# Patient Record
Sex: Female | Born: 1980 | Race: White | Hispanic: No | Marital: Married | Smoking: Never smoker
Health system: Southern US, Community
[De-identification: ages and names within clinical notes are randomized; demographics above are authoritative.]

## PROBLEM LIST (undated history)

## (undated) DIAGNOSIS — E079 Disorder of thyroid, unspecified: Secondary | ICD-10-CM

## (undated) DIAGNOSIS — C50919 Malignant neoplasm of unspecified site of unspecified female breast: Secondary | ICD-10-CM

## (undated) DIAGNOSIS — Z8042 Family history of malignant neoplasm of prostate: Secondary | ICD-10-CM

## (undated) DIAGNOSIS — Z800 Family history of malignant neoplasm of digestive organs: Secondary | ICD-10-CM

## (undated) DIAGNOSIS — E039 Hypothyroidism, unspecified: Secondary | ICD-10-CM

## (undated) HISTORY — DX: Family history of malignant neoplasm of prostate: Z80.42

## (undated) HISTORY — PX: FACIAL FRACTURE SURGERY: SHX1570

## (undated) HISTORY — PX: WISDOM TOOTH EXTRACTION: SHX21

## (undated) HISTORY — PX: NO PAST SURGERIES: SHX2092

## (undated) HISTORY — PX: OTHER SURGICAL HISTORY: SHX169

## (undated) HISTORY — DX: Family history of malignant neoplasm of digestive organs: Z80.0

---

## 2003-06-27 ENCOUNTER — Other Ambulatory Visit: Admission: RE | Admit: 2003-06-27 | Discharge: 2003-06-27 | Payer: Self-pay | Admitting: Obstetrics and Gynecology

## 2004-09-18 ENCOUNTER — Other Ambulatory Visit: Admission: RE | Admit: 2004-09-18 | Discharge: 2004-09-18 | Payer: Self-pay | Admitting: Obstetrics and Gynecology

## 2005-10-23 ENCOUNTER — Other Ambulatory Visit: Admission: RE | Admit: 2005-10-23 | Discharge: 2005-10-23 | Payer: Self-pay | Admitting: Obstetrics and Gynecology

## 2010-04-14 NOTE — L&D Delivery Note (Signed)
Delivery Note At 8:00 AM a viable female, "Heather Mckenzie", was delivered via Vaginal, Spontaneous Delivery (Presentation: Left Occiput Anterior).  Patient in lithotomy/semi-Fowler's for delivery.  Left nuchal arm noted at delivery.  APGAR: 9, 9; weight 7 lb 8.3 oz (3410 g).   Placenta status: Intact, Spontaneous.  Cord: 3 vessels with the following complications: .  Cord pH: NA.   Dr. Normand Sloop called in due to extent of lacerations noted.  Patient given Nubain prior to initiation of repair.  No significant bleeding from lacerations noted while awaiting Dr. Redmond Baseman arrival.  Anesthesia: None  Episiotomy: None Lacerations: 2nd degree;Bilateral Sulcus Suture Repair: See Dr. Redmond Baseman note for further details of repair. Est. Blood Loss (mL): 200  Mom to postpartum.  Baby to skin to skin with mom, in good condition.  Transported to [facility] with mom.Marland Kitchen  Heather Mckenzie 02/24/2011, 11:20 AM

## 2010-08-08 LAB — GC/CHLAMYDIA PROBE AMP, GENITAL
Chlamydia: NEGATIVE
Gonorrhea: NEGATIVE

## 2010-08-08 LAB — ABO/RH: "RH Type ": NEGATIVE

## 2010-08-08 LAB — HIV ANTIBODY (ROUTINE TESTING W REFLEX): HIV: NONREACTIVE

## 2010-08-08 LAB — RUBELLA ANTIBODY, IGM: Rubella: UNDETERMINED

## 2010-08-08 LAB — HEPATITIS B SURFACE ANTIGEN: Hepatitis B Surface Ag: NEGATIVE

## 2010-08-08 LAB — RPR: RPR: NONREACTIVE

## 2010-08-08 LAB — ANTIBODY SCREEN: Antibody Screen: NEGATIVE

## 2011-02-07 LAB — STREP B DNA PROBE: GBS: NEGATIVE

## 2011-02-24 ENCOUNTER — Inpatient Hospital Stay (HOSPITAL_COMMUNITY)
Admission: AD | Admit: 2011-02-24 | Discharge: 2011-02-26 | DRG: 775 | Disposition: A | Discharge status: Home / Self Care | Payer: Medicaid Other | Source: Ambulatory Visit | Attending: Obstetrics and Gynecology | Admitting: Obstetrics and Gynecology

## 2011-02-24 ENCOUNTER — Encounter (HOSPITAL_COMMUNITY): Payer: Self-pay | Admitting: *Deleted

## 2011-02-24 LAB — COMPREHENSIVE METABOLIC PANEL
ALT: 13 U/L (ref 0–35)
AST: 19 U/L (ref 0–37)
Albumin: 2.3 g/dL — ABNORMAL LOW (ref 3.5–5.2)
Alkaline Phosphatase: 262 U/L — ABNORMAL HIGH (ref 39–117)
BUN: 9 mg/dL (ref 6–23)
CO2: 19 mEq/L (ref 19–32)
Calcium: 9.3 mg/dL (ref 8.4–10.5)
Chloride: 100 mEq/L (ref 96–112)
Creatinine, Ser: 0.57 mg/dL (ref 0.50–1.10)
GFR calc Af Amer: >90 mL/min (ref >90)
GFR calc non Af Amer: >90 mL/min (ref >90)
Glucose, Bld: 104 mg/dL — ABNORMAL HIGH (ref 70–99)
Potassium: 4 mEq/L (ref 3.5–5.1)
Sodium: 133 mEq/L — ABNORMAL LOW (ref 135–145)
Total Bilirubin: 0.1 mg/dL — ABNORMAL LOW (ref 0.3–1.2)
Total Protein: 6 g/dL (ref 6.0–8.3)

## 2011-02-24 LAB — CBC
HCT: 38.5 % (ref 36.0–46.0)
Hemoglobin: 12.5 g/dL (ref 12.0–15.0)
MCH: 28 pg (ref 26.0–34.0)
MCHC: 32.5 g/dL (ref 30.0–36.0)
MCV: 86.3 fL (ref 78.0–100.0)
Platelets: 317 10*3/uL (ref 150–400)
RBC: 4.46 MIL/uL (ref 3.87–5.11)
RDW: 14.5 % (ref 11.5–15.5)
WBC: 19.3 10*3/uL — ABNORMAL HIGH (ref 4.0–10.5)

## 2011-02-24 LAB — URIC ACID: Uric Acid, Serum: 6.1 mg/dL (ref 2.4–7.0)

## 2011-02-24 LAB — RPR: RPR Ser Ql: NONREACTIVE

## 2011-02-24 MED ORDER — LACTATED RINGERS IV SOLN
INTRAVENOUS | Status: DC
  Administered 2011-02-24: 07:00:00 via INTRAVENOUS

## 2011-02-24 MED ORDER — ZOLPIDEM TARTRATE 5 MG PO TABS: 5.0000 mg | ORAL_TABLET | Freq: Every evening | ORAL | Status: DC | PRN

## 2011-02-24 MED ORDER — ONDANSETRON HCL 4 MG PO TABS: 4.0000 mg | ORAL_TABLET | ORAL | Status: DC | PRN

## 2011-02-24 MED ORDER — PRENATAL PLUS 27-1 MG PO TABS
1.0000 | ORAL_TABLET | Freq: Every day | ORAL | Status: DC
  Administered 2011-02-24 – 2011-02-25 (×2): 1 via ORAL
  Filled 2011-02-24: qty 1

## 2011-02-24 MED ORDER — SIMETHICONE 80 MG PO CHEW: 80.0000 mg | CHEWABLE_TABLET | ORAL | Status: DC | PRN

## 2011-02-24 MED ORDER — DIPHENHYDRAMINE HCL 25 MG PO CAPS: 25.0000 mg | ORAL_CAPSULE | Freq: Four times a day (QID) | ORAL | Status: DC | PRN

## 2011-02-24 MED ORDER — OXYTOCIN 20 UNITS IN LACTATED RINGERS INFUSION - SIMPLE
INTRAVENOUS | Status: AC
  Filled 2011-02-24: qty 1000

## 2011-02-24 MED ORDER — OXYTOCIN 20 UNITS IN LACTATED RINGERS INFUSION - SIMPLE
125.0000 mL/h | Freq: Once | INTRAVENOUS | Status: AC
  Administered 2011-02-24: 999 mL/h via INTRAVENOUS

## 2011-02-24 MED ORDER — CITRIC ACID-SODIUM CITRATE 334-500 MG/5ML PO SOLN: 30.0000 mL | ORAL | Status: DC | PRN

## 2011-02-24 MED ORDER — TETANUS-DIPHTH-ACELL PERTUSSIS 5-2.5-18.5 LF-MCG/0.5 IM SUSP: 0.5000 mL | Freq: Once | INTRAMUSCULAR | Status: DC

## 2011-02-24 MED ORDER — LANOLIN HYDROUS EX OINT: TOPICAL_OINTMENT | CUTANEOUS | Status: DC | PRN

## 2011-02-24 MED ORDER — LIDOCAINE HCL (PF) 1 % IJ SOLN
30.0000 mL | INTRAMUSCULAR | Status: AC | PRN
  Administered 2011-02-24 (×2): 30 mL via SUBCUTANEOUS

## 2011-02-24 MED ORDER — LIDOCAINE HCL (PF) 1 % IJ SOLN
INTRAMUSCULAR | Status: AC
  Filled 2011-02-24: qty 30

## 2011-02-24 MED ORDER — ACETAMINOPHEN 325 MG PO TABS: 650.0000 mg | ORAL_TABLET | ORAL | Status: DC | PRN

## 2011-02-24 MED ORDER — BUPIVACAINE-EPINEPHRINE 0.25% -1:200000 IJ SOLN
50.0000 mL | Freq: Once | INTRAMUSCULAR | Status: DC
Start: 2011-02-24 — End: 2011-02-24
  Filled 2011-02-24: qty 50

## 2011-02-24 MED ORDER — ONDANSETRON HCL 4 MG/2ML IJ SOLN: 4.0000 mg | INTRAMUSCULAR | Status: DC | PRN

## 2011-02-24 MED ORDER — NALBUPHINE SYRINGE 5 MG/0.5 ML: 5.0000 mg | INJECTION | Freq: Once | INTRAMUSCULAR | Status: DC

## 2011-02-24 MED ORDER — OXYTOCIN 10 UNIT/ML IJ SOLN: 10.0000 [IU] | Freq: Once | INTRAMUSCULAR | Status: DC

## 2011-02-24 MED ORDER — MAGNESIUM HYDROXIDE 400 MG/5ML PO SUSP: 30.0000 mL | ORAL | Status: DC | PRN

## 2011-02-24 MED ORDER — ONDANSETRON HCL 4 MG/2ML IJ SOLN: 4.0000 mg | Freq: Four times a day (QID) | INTRAMUSCULAR | Status: DC | PRN

## 2011-02-24 MED ORDER — DIBUCAINE 1 % RE OINT: 1.0000 "application " | TOPICAL_OINTMENT | RECTAL | Status: DC | PRN

## 2011-02-24 MED ORDER — WITCH HAZEL-GLYCERIN EX PADS: 1.0000 "application " | MEDICATED_PAD | CUTANEOUS | Status: DC | PRN

## 2011-02-24 MED ORDER — OXYTOCIN BOLUS FROM INFUSION
500.0000 mL | Freq: Once | INTRAVENOUS | Status: DC
  Filled 2011-02-24: qty 500
  Filled 2011-02-24: qty 1000

## 2011-02-24 MED ORDER — OXYCODONE-ACETAMINOPHEN 5-325 MG PO TABS
1.0000 | ORAL_TABLET | ORAL | Status: DC | PRN
  Administered 2011-02-24: 1 via ORAL
  Filled 2011-02-24: qty 1

## 2011-02-24 MED ORDER — LACTATED RINGERS IV SOLN: 500.0000 mL | INTRAVENOUS | Status: DC | PRN

## 2011-02-24 MED ORDER — IBUPROFEN 600 MG PO TABS: 600.0000 mg | ORAL_TABLET | Freq: Four times a day (QID) | ORAL | Status: DC | PRN

## 2011-02-24 MED ORDER — OXYCODONE-ACETAMINOPHEN 5-325 MG PO TABS: 2.0000 | ORAL_TABLET | ORAL | Status: DC | PRN

## 2011-02-24 MED ORDER — OXYTOCIN 10 UNIT/ML IJ SOLN
INTRAMUSCULAR | Status: AC
  Filled 2011-02-24: qty 2

## 2011-02-24 MED ORDER — MEASLES, MUMPS & RUBELLA VAC ~~LOC~~ INJ
0.5000 mL | INJECTION | Freq: Once | SUBCUTANEOUS | Status: AC
  Administered 2011-02-26: 0.5 mL via SUBCUTANEOUS
  Filled 2011-02-24 (×2): qty 0.5

## 2011-02-24 MED ORDER — BENZOCAINE-MENTHOL 20-0.5 % EX AERO
1.0000 "application " | INHALATION_SPRAY | CUTANEOUS | Status: DC | PRN
  Administered 2011-02-25: 1 via TOPICAL
  Administered 2011-02-26: 10:00:00 via TOPICAL

## 2011-02-24 MED ORDER — IBUPROFEN 600 MG PO TABS
600.0000 mg | ORAL_TABLET | Freq: Four times a day (QID) | ORAL | Status: DC
  Administered 2011-02-24 – 2011-02-26 (×7): 600 mg via ORAL
  Filled 2011-02-24 (×7): qty 1

## 2011-02-24 MED ORDER — NALBUPHINE SYRINGE 5 MG/0.5 ML
INJECTION | INTRAMUSCULAR | Status: AC
Start: 2011-02-24 — End: 2011-02-24
  Administered 2011-02-24: 5 mg via INTRAVENOUS
  Filled 2011-02-24: qty 0.5

## 2011-02-24 MED ORDER — SENNOSIDES-DOCUSATE SODIUM 8.6-50 MG PO TABS
2.0000 | ORAL_TABLET | Freq: Every day | ORAL | Status: DC
  Administered 2011-02-25: 2 via ORAL
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
## 2011-02-24 NOTE — Progress Notes (Signed)
UR chart review completed.  

## 2011-02-24 NOTE — Progress Notes (Signed)
.  Subjective: Coping well, c/o suprapubic pain, pushing well   Objective: BP 146/68  Pulse 90  Temp(Src) 99 F (37.2 C) (Oral)  Resp 20  Ht 5\' 2"  (1.575 m)  Wt 92.08 kg (203 lb)  BMI 37.13 kg/m2   FHT:  FHR: 130 bpm, variability: moderate,  accelerations:  Present,  decelerations:  Present early decels UC:   regular, every 2-3 minutes SVE:   Dilation: 10 Effacement (%): 100 Station: +1 Exam by:: S. Tula Schryver, CNM    Assessment / Plan: Spontaneous labor, progressing normally GBS    Fetal Wellbeing:  Category I Pain Control:  Labor support without medications  Pushing well, progressing, fhr with early decels, overall reassuring IVF"s started and routine labs including PIH labs drawn   Update physician PRN  Ahlayah Tarkowski M 02/24/2011, 7:05 AM

## 2011-02-24 NOTE — Progress Notes (Signed)
I was called to see the patient to help repair the vaginal lacerations after delivery.  The patient had a right sulcus laceration. She had a left sulcus laceration as well. A second-degree perineal laceration as well. 30 cc of quarter percent Marcaine and epinephrine and 30 cc of 1% Xylocaine were used for anesthesia. Patient was also given 1 mg of Nubain. A sponge was placed into the vagina. Sims retractor was also used to have visualization. A right sulcus laceration was repaired with 2-0 chromic in a running fashion. Hemostasis was noted. The left sulcus tear was repaired with 2-0 chromic and any continuous fashion.hemo Stasis was noted. The perineal laceration and perineal body was reapproximated using 3-0 Vicryl with interrupted sutures manger of the perineal laceration was repaired with 3-0 Vicryl using a running stitch and subcuticular stitches on the skin. The sponge was removed from the vagina as was the retractor. A rectal exam was done the rectum was intact with good tone and no sutures palpated. EBL is about 100 cc.

## 2011-02-24 NOTE — Plan of Care (Signed)
Problem: Consults Goal: Birthing Suites Patient Information Press F2 to bring up selections list Outcome: Completed/Met Date Met:  02/24/11  Pt 37-[redacted] weeks EGA  Comments:  39.3 wks

## 2011-02-24 NOTE — Progress Notes (Signed)
Notified of VE.  Pt may go to room 165.

## 2011-02-24 NOTE — Progress Notes (Signed)
FHT's prior to tansfer.  Pt 9cm and transferred to BS.

## 2011-02-24 NOTE — Progress Notes (Signed)
Notified of VE.  Orders to transfer to [facility].

## 2011-02-25 LAB — CBC
HCT: 32.7 % — ABNORMAL LOW (ref 36.0–46.0)
Hemoglobin: 10.5 g/dL — ABNORMAL LOW (ref 12.0–15.0)
MCH: 28.1 pg (ref 26.0–34.0)
MCHC: 32.1 g/dL (ref 30.0–36.0)
MCV: 87.4 fL (ref 78.0–100.0)
Platelets: 297 10*3/uL (ref 150–400)
RBC: 3.74 MIL/uL — ABNORMAL LOW (ref 3.87–5.11)
RDW: 15 % (ref 11.5–15.5)
WBC: 16.5 10*3/uL — ABNORMAL HIGH (ref 4.0–10.5)

## 2011-02-25 LAB — ABO/RH: RH Type: POSITIVE

## 2011-02-25 MED ORDER — BENZOCAINE-MENTHOL 20-0.5 % EX AERO
INHALATION_SPRAY | CUTANEOUS | Status: AC
  Administered 2011-02-25: 1 via TOPICAL
  Filled 2011-02-25: qty 56

## 2011-02-25 NOTE — Progress Notes (Signed)
Post Partum Day 1 Subjective: no complaints, up ad lib, voiding, tolerating PO and + flatus  Objective: Blood pressure 130/90, pulse 99, temperature 97.5 F (36.4 C), temperature source Oral, resp. rate 18, height 5\' 2"  (1.575 m), weight 92.08 kg (203 lb), SpO2 96.00%, unknown if currently breastfeeding.  Physical Exam:  General: alert Lochia: appropriate Uterine Fundus: firm Incision: NA DVT Evaluation: No evidence of DVT seen on physical exam.   Basename 02/25/11 0603 02/24/11 0645  HGB 10.5* 12.5  HCT 32.7* 38.5    Assessment/Plan: Plan for discharge tomorrow Maybe today. Contraception: Nortrel 777 Bottle Feeding Circ discussed.   LOS: 1 day   Heather Mckenzie V 02/25/2011, 10:52 AM

## 2011-02-25 NOTE — Progress Notes (Signed)
Mom is o+ in prenatals.  Spoke with tameka in the lab.  She stated prenatals show o+, so no rhogam studies ordered Lottie Dawson rn

## 2011-02-26 MED ORDER — BENZOCAINE-MENTHOL 20-0.5 % EX AERO
INHALATION_SPRAY | CUTANEOUS | Status: AC
  Filled 2011-02-26: qty 56

## 2011-02-26 MED ORDER — IBUPROFEN 600 MG PO TABS: 600.0000 mg | ORAL_TABLET | Freq: Four times a day (QID) | ORAL | Status: AC

## 2011-02-26 MED ORDER — NORETHINDRONE-ETH ESTRADIOL 1-35 MG-MCG PO TABS: 1.0000 | ORAL_TABLET | Freq: Every day | ORAL | Status: DC

## 2011-02-26 NOTE — Discharge Summary (Signed)
  Obstetric Discharge Summary Reason for Admission: onset of labor Prenatal Procedures: none Intrapartum Procedures: spontaneous vaginal delivery Postpartum Procedures: none Complications-Operative and Postpartum: 2nd degree perineal laceration, bilateral sulcus tears  Temp:  [97.6 F (36.4 C)-98.2 F (36.8 C)] 97.6 F (36.4 C) (11/14 0500) Pulse Rate:  [79-98] 79  (11/14 0500) Resp:  [18-20] 18  (11/14 0500) BP: (106-114)/(68-76) 106/71 mmHg (11/14 0500) Hemoglobin  Date Value Range Status  02/25/2011 10.5* 12.0-15.0 (g/dL) Final     HCT  Date Value Range Status  02/25/2011 32.7* 36.0-46.0 (%) Final    Hospital Course:  Admitted in labor 02/24/11. Negative GBS. Progressed to fully dilated quickly, without medication, at 5:30 am.  Delivery was performed by Nigel Bridgeman, CNM, at 8am  Patient and baby tolerated the procedure well, although patient did have a deep 2nd degree and bilateral sulcus lacerations noted. In light of the depth of the right sulcus tear, and the patient's unmedicated status, Dr. Normand Sloop was asked to come to complete the repair.  This was done without difficulty, with local anesthesia and Nubain IV.  Infant to FTN. Mother and infant then had an uncomplicated postpartum course, with bottlefeeding going well, and appropriate perineal healing. Mom's physical exam was WNL, and she was discharged home in stable condition. Contraception plan was Nortrell 777.  She received adequate benefit from po pain medications, using only Motrin.    Other issues:  NA      Discharge Diagnoses: Term Pregnancy-delivered  Discharge Information: Date: 02/26/2011 Activity: Per [facility] handout Diet: routine Medications:  Medication List  As of 02/26/2011  6:55 AM   START taking these medications         ibuprofen 600 MG tablet   Commonly known as: ADVIL,MOTRIN   Take 1 tablet (600 mg total) by mouth every 6 (six) hours.         CONTINUE taking these medications        cetirizine 10 MG tablet   Commonly known as: ZYRTEC      IRON PO      prenatal vitamin w/FE, FA 27-1 MG Tabs          Where to get your medications    These are the prescriptions that you need to pick up.   You may get these medications from any pharmacy.         ibuprofen 600 MG tablet           Condition: stable Instructions: refer to practice specific booklet, use sitz bath BID Discharge to: home Follow-up Information    Follow up with [facility] in 6 weeks.         Newborn Data: Live born "[place]" Information for the patient's newborn:  Kirti, Carl [[id number]]  female ; APGAR 9/9 , ; weight 7+8;  Home with mother.  Nigel Bridgeman 02/26/2011, 6:55 AM

## 2011-12-12 ENCOUNTER — Other Ambulatory Visit (INDEPENDENT_AMBULATORY_CARE_PROVIDER_SITE_OTHER): Payer: Self-pay

## 2011-12-12 ENCOUNTER — Telehealth: Payer: Self-pay | Admitting: Obstetrics and Gynecology

## 2011-12-12 DIAGNOSIS — N912 Amenorrhea, unspecified: Secondary | ICD-10-CM

## 2011-12-12 LAB — POCT URINE PREGNANCY: Preg Test, Ur: POSITIVE

## 2011-12-12 NOTE — Progress Notes (Unsigned)
Pt had appt for UPT. Positive.  Gave PNV's and sch for NOB workup.  Pt is 9 weeks and 5 days.  Pt had last baby in 11/12.

## 2011-12-22 ENCOUNTER — Telehealth: Payer: Self-pay | Admitting: Obstetrics and Gynecology
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
## 2011-12-23 ENCOUNTER — Encounter: Payer: Medicaid Other | Admitting: Obstetrics and Gynecology

## 2011-12-23 ENCOUNTER — Encounter: Payer: Self-pay | Admitting: Obstetrics and Gynecology

## 2011-12-25 ENCOUNTER — Encounter: Payer: Self-pay | Admitting: Obstetrics and Gynecology

## 2011-12-30 NOTE — Telephone Encounter (Signed)
DONE

## 2012-01-02 ENCOUNTER — Encounter: Payer: Self-pay | Admitting: Obstetrics and Gynecology

## 2012-01-27 ENCOUNTER — Encounter: Payer: Self-pay | Admitting: Obstetrics and Gynecology

## 2012-01-27 ENCOUNTER — Ambulatory Visit (INDEPENDENT_AMBULATORY_CARE_PROVIDER_SITE_OTHER): Payer: Medicaid Other | Admitting: Obstetrics and Gynecology

## 2012-01-27 VITALS — BP 110/64 | Wt 190.0 lb

## 2012-01-27 DIAGNOSIS — Z3490 Encounter for supervision of normal pregnancy, unspecified, unspecified trimester: Secondary | ICD-10-CM

## 2012-01-27 DIAGNOSIS — O09899 Supervision of other high risk pregnancies, unspecified trimester: Secondary | ICD-10-CM

## 2012-01-27 DIAGNOSIS — Z8619 Personal history of other infectious and parasitic diseases: Secondary | ICD-10-CM

## 2012-01-27 DIAGNOSIS — IMO0001 Reserved for inherently not codable concepts without codable children: Secondary | ICD-10-CM | POA: Insufficient documentation

## 2012-01-27 DIAGNOSIS — Z124 Encounter for screening for malignant neoplasm of cervix: Secondary | ICD-10-CM

## 2012-01-27 DIAGNOSIS — Z331 Pregnant state, incidental: Secondary | ICD-10-CM

## 2012-01-27 DIAGNOSIS — O09299 Supervision of pregnancy with other poor reproductive or obstetric history, unspecified trimester: Secondary | ICD-10-CM

## 2012-01-27 LAB — POCT URINALYSIS DIPSTICK
Bilirubin, UA: NEGATIVE
Blood, UA: NEGATIVE
Glucose, UA: NEGATIVE
Ketones, UA: NEGATIVE
Leukocytes, UA: NEGATIVE
Nitrite, UA: NEGATIVE
Protein, UA: NEGATIVE
Spec Grav, UA: 1.02
Urobilinogen, UA: NEGATIVE
pH, UA: 6

## 2012-01-27 MED ORDER — CONCEPT DHA 53.5-38-1 MG PO CAPS: 1.0000 | ORAL_CAPSULE | Freq: Every day | ORAL | Status: DC

## 2012-01-27 NOTE — Progress Notes (Signed)
Subjective:    Madigan Kreamer is being seen today for her first obstetrical visit at [redacted]w[redacted]d gestation by LMP.  She reports doing well--feels better than last pregnancy.  Her obstetrical history is significant for: Patient Active Problem List  Diagnosis  . Short interval between pregnancies complicating pregnancy, antepartum  . FHX of NTD  . Hx of equivocal  rubella  . Hx of maternal laceration, sulcus tear, currently pregnant  . Rapid first stage of labor    Relationship with FOB:  Married, supportive,  Feeding plan:   Breast  Pregnancy history fully reviewed.  The following portions of the patient's history were reviewed and updated as appropriate: allergies, current medications, past family history, past medical history, past social history, past surgical history and problem list.  Review of Systems Pertinent ROS is described in HPI   Objective:   BP 110/64  Wt 190 lb (86.183 kg)  LMP 10/05/2011  Breastfeeding? Unknown Wt Readings from Last 1 Encounters:  01/27/12 190 lb (86.183 kg)   BMI: There is no height on file to calculate BMI.  General: alert, cooperative and no distress Respiratory: clear to auscultation bilaterally Cardiovascular: regular rate and rhythm, S1, S2 normal, no murmur Breasts:  No dominant masses, nipples erect Gastrointestinal: soft, non-tender; no masses,  no organomegaly Extremities: extremities normal, no pain or edema Vaginal Bleeding: None  EXTERNAL GENITALIA: normal appearing vulva with no masses, tenderness or lesions VAGINA: no abnormal discharge or lesions CERVIX: no lesions or cervical motion tenderness; cervix closed, long, firm UTERUS: gravid and consistent with 16 weeks ADNEXA: no masses palpable and nontender OB EXAM PELVIMETRY: appears adequate   FHR:  160  bpm  Assessment:    Pregnancy at  16 2/7 weeks Late to care Hx rapid labor Hx sulcus tear(s) with delivery Plan:     Prenatal panel reviewed and discussed with  the patient:  Labs drawn today Pap smear collected: Done today GC/Chlamydia collected: Done today Wet prep:  Negative Discussion of Genetic testing options: Declines genetic testing Prenatal vitamins recommended Problem list reviewed and updated.  Plan of care: Follow up in 4 weeks for ROB and anatomy US. Rx [facility] DHA per patient request   Nigel Bridgeman, CNM, [place]

## 2012-01-27 NOTE — Progress Notes (Signed)
LMP 10/05/2011 Last Pap smear 2012 WNL Pt states that she saw blood in toilet yesterday after she voided. Not sure if it was from urine or vagina.  Pt declines Flu Vaccine

## 2012-01-28 ENCOUNTER — Encounter: Payer: Self-pay | Admitting: Obstetrics and Gynecology

## 2012-01-28 LAB — PRENATAL PANEL VII
Antibody Screen: NEGATIVE
Basophils Absolute: 0 10*3/uL (ref 0.0–0.1)
Basophils Relative: 0 % (ref 0–1)
Eosinophils Absolute: 0.1 10*3/uL (ref 0.0–0.7)
Eosinophils Relative: 1 % (ref 0–5)
HCT: 36.3 % (ref 36.0–46.0)
HIV: NONREACTIVE
Hemoglobin: 11.8 g/dL — ABNORMAL LOW (ref 12.0–15.0)
Hepatitis B Surface Ag: NEGATIVE
Lymphocytes Relative: 22 % (ref 12–46)
Lymphs Abs: 2.5 10*3/uL (ref 0.7–4.0)
MCH: 28.2 pg (ref 26.0–34.0)
MCHC: 32.5 g/dL (ref 30.0–36.0)
MCV: 86.6 fL (ref 78.0–100.0)
Monocytes Absolute: 1.2 10*3/uL — ABNORMAL HIGH (ref 0.1–1.0)
Monocytes Relative: 10 % (ref 3–12)
Neutro Abs: 7.6 10*3/uL (ref 1.7–7.7)
Neutrophils Relative %: 67 % (ref 43–77)
Platelets: 344 10*3/uL (ref 150–400)
RBC: 4.19 MIL/uL (ref 3.87–5.11)
RDW: 13.6 % (ref 11.5–15.5)
Rh Type: POSITIVE
Rubella: 29.6 IU/mL — ABNORMAL HIGH
WBC: 11.5 10*3/uL — ABNORMAL HIGH (ref 4.0–10.5)

## 2012-01-28 LAB — GC/CHLAMYDIA PROBE AMP, GENITAL
Chlamydia, DNA Probe: NEGATIVE
GC Probe Amp, Genital: NEGATIVE

## 2012-02-11 ENCOUNTER — Ambulatory Visit: Payer: Self-pay | Admitting: Obstetrics and Gynecology

## 2012-02-18 ENCOUNTER — Ambulatory Visit (INDEPENDENT_AMBULATORY_CARE_PROVIDER_SITE_OTHER): Payer: Medicaid Other | Admitting: Obstetrics and Gynecology

## 2012-02-18 ENCOUNTER — Ambulatory Visit (INDEPENDENT_AMBULATORY_CARE_PROVIDER_SITE_OTHER): Payer: Medicaid Other

## 2012-02-18 VITALS — BP 110/72 | Wt 196.0 lb

## 2012-02-18 DIAGNOSIS — Z3689 Encounter for other specified antenatal screening: Secondary | ICD-10-CM

## 2012-02-18 DIAGNOSIS — Z3490 Encounter for supervision of normal pregnancy, unspecified, unspecified trimester: Secondary | ICD-10-CM

## 2012-02-18 DIAGNOSIS — O358XX0 Maternal care for other (suspected) fetal abnormality and damage, not applicable or unspecified: Secondary | ICD-10-CM

## 2012-02-18 DIAGNOSIS — IMO0002 Reserved for concepts with insufficient information to code with codable children: Secondary | ICD-10-CM

## 2012-02-18 LAB — US OB COMP + 14 WK

## 2012-02-18 NOTE — Progress Notes (Signed)
Pt stated no issues today.  

## 2012-02-18 NOTE — Progress Notes (Signed)
[place] today:  19 5/7 weeks, EDC 07/09/12, c/w dating. EFW 55%ile, cervix 3.39, normal anatomy, but spine views limited. Pylectasis noted bilaterally--RK 0.30, LK 0.35. Plan f/u at [facility] on anatomy limitations and FRP. Declined genetic testing.

## 2012-03-16 ENCOUNTER — Other Ambulatory Visit: Payer: Self-pay

## 2012-03-16 DIAGNOSIS — Z3689 Encounter for other specified antenatal screening: Secondary | ICD-10-CM

## 2012-03-17 ENCOUNTER — Ambulatory Visit (INDEPENDENT_AMBULATORY_CARE_PROVIDER_SITE_OTHER): Payer: Medicaid Other | Admitting: Obstetrics and Gynecology

## 2012-03-17 ENCOUNTER — Ambulatory Visit (INDEPENDENT_AMBULATORY_CARE_PROVIDER_SITE_OTHER): Payer: Medicaid Other

## 2012-03-17 ENCOUNTER — Encounter: Payer: Self-pay | Admitting: Obstetrics and Gynecology

## 2012-03-17 VITALS — BP 110/60 | Wt 204.0 lb

## 2012-03-17 DIAGNOSIS — Z331 Pregnant state, incidental: Secondary | ICD-10-CM

## 2012-03-17 DIAGNOSIS — Z3689 Encounter for other specified antenatal screening: Secondary | ICD-10-CM

## 2012-03-17 DIAGNOSIS — Z1389 Encounter for screening for other disorder: Secondary | ICD-10-CM

## 2012-03-17 DIAGNOSIS — O358XX0 Maternal care for other (suspected) fetal abnormality and damage, not applicable or unspecified: Secondary | ICD-10-CM

## 2012-03-17 LAB — US OB FOLLOW UP

## 2012-03-17 NOTE — Progress Notes (Signed)
[redacted]w[redacted]d  [place] f/u fetal spine still not viewed Bilateral fetal pyelectasis persists rv'd PTL sx's RTO 4 wks Repeat US  1hr gtt

## 2012-03-17 NOTE — Patient Instructions (Signed)
Preterm Labor Preterm labor is when labor starts at less than 37 weeks of pregnancy. The normal length of a pregnancy is 39 to 41 weeks. CAUSES Often, there is no identifiable underlying cause as to why a woman goes into preterm labor. However, one of the most common known causes of preterm labor is infection. Infections of the uterus, cervix, vagina, amniotic sac, bladder, kidney, or even the lungs (pneumonia) can cause labor to start. Other causes of preterm labor include:  Urogenital infections, such as yeast infections and bacterial vaginosis.  Uterine abnormalities (uterine shape, uterine septum, fibroids, bleeding from the placenta).  A cervix that has been operated on and opens prematurely.  Malformations in the baby.  Multiple gestations (twins, triplets, and so on).  Breakage of the amniotic sac. Additional risk factors for preterm labor include:  Previous history of preterm labor.  Premature rupture of membranes (PROM).  A placenta that covers the opening of the cervix (placenta previa).  A placenta that separates from the uterus (placenta abruption).  A cervix that is too weak to hold the baby in the uterus (incompetence cervix).  Having too much fluid in the amniotic sac (polyhydramnios).  Taking illegal drugs or smoking while pregnant.  Not gaining enough weight while pregnant.  Women younger than 36 and older than 31 years old.  Low socioeconomic status.  African-American ethnicity. SYMPTOMS Signs and symptoms of preterm labor include:  Menstrual-like cramps.  Contractions that are 30 to 70 seconds apart, become very regular, closer together, and are more intense and painful.  Contractions that start on the top of the uterus and spread down to the lower abdomen and back.  A sense of increased pelvic pressure or back pain.  A watery or bloody discharge that comes from the vagina. DIAGNOSIS  A diagnosis can be confirmed by:  A vaginal exam.  An  ultrasound of the cervix.  Sampling (swabbing) cervico-vaginal secretions. These samples can be tested for the presence of fetal fibronectin. This is a protein found in cervical discharge which is associated with preterm labor.  Fetal monitoring. TREATMENT  Depending on the length of the pregnancy and other circumstances, a caregiver may suggest bed rest. If necessary, there are medicines that can be given to stop contractions and to quicken fetal lung maturity. If labor happens before 34 weeks of pregnancy, a prolonged hospital stay may be recommended. Treatment depends on the condition of both the mother and baby. PREVENTION There are some things a mother can do to lower the risk of preterm labor in future pregnancies. A woman can:   Stop smoking.  Maintain healthy weight gain and avoid chemicals and drugs that are not necessary.  Be watchful for any type of infection.  Inform her caregiver if she has a known history of preterm labor. Document Released: 06/21/2003 Document Revised: 06/23/2011 Document Reviewed: 07/26/2010 [facility] Patient Information 2013 [place], [place].  Fetal Movement Counts  Kick counts is highly recommended in high risk pregnancies, but it is a good idea for every pregnant woman to do. Start counting fetal movements at 28 weeks of the pregnancy.  Fetal movements increase after eating a full meal or eating or drinking something sweet (the blood sugar is higher). It is also important to drink plenty of fluids (well hydrated) before doing the count. (At least 64oz of water/day) Lie on your left side because it helps with the circulation or you can sit in a comfortable chair with your arms over your belly (abdomen) with no distractions  around you. DOING THE COUNT  Try to do the count the same time of day each time you do it.   Mark the day and time, then see how long it takes for you to feel 10 movements (kicks, flutters, swishes, rolls). You should have at least 10  movements within 2 hours. You will most likely feel 10 movements in much less than 2 hours. If you do not, wait an hour and count again. After a couple of days you will see a pattern.   What you are looking for is a change in the pattern or not enough counts in 2 hours. Is it taking longer in time to reach 10 movements?   SEEK MEDICAL CARE IF: - Please call the office - even after hours.   You feel less than 10 counts in 2 hours. Tried twice.   No movement in one hour.   The pattern is changing or taking longer each day to reach 10 counts in 2 hours.   You feel the baby is not moving as it usually does.

## 2012-03-17 NOTE — Progress Notes (Signed)
[redacted]w[redacted]d Ultrasound shows:  SIUP  S=D     [place] EDD: 07/11/12           AFI: normal (AP Pocket 5.6 cm)           EFW 57%tile           Cervical length: 3.17 cm           Placenta localization: anterior           Fetal presentation: breech                    Anatomy survey is spine only viewed in supine position again today           Mild Fetal pyelectasis RK = 3.6 mm LK =4.0 mm / Previous scan RK = 3.0 LK 3.5 mm

## 2012-04-13 ENCOUNTER — Ambulatory Visit (INDEPENDENT_AMBULATORY_CARE_PROVIDER_SITE_OTHER): Payer: Medicaid Other

## 2012-04-13 ENCOUNTER — Other Ambulatory Visit: Payer: Medicaid Other

## 2012-04-13 ENCOUNTER — Ambulatory Visit (INDEPENDENT_AMBULATORY_CARE_PROVIDER_SITE_OTHER): Payer: Medicaid Other | Admitting: Obstetrics and Gynecology

## 2012-04-13 VITALS — BP 126/68 | Wt 205.0 lb

## 2012-04-13 DIAGNOSIS — IMO0002 Reserved for concepts with insufficient information to code with codable children: Secondary | ICD-10-CM

## 2012-04-13 DIAGNOSIS — O358XX0 Maternal care for other (suspected) fetal abnormality and damage, not applicable or unspecified: Secondary | ICD-10-CM

## 2012-04-13 DIAGNOSIS — Z3689 Encounter for other specified antenatal screening: Secondary | ICD-10-CM

## 2012-04-13 DIAGNOSIS — O09899 Supervision of other high risk pregnancies, unspecified trimester: Secondary | ICD-10-CM

## 2012-04-13 DIAGNOSIS — Z331 Pregnant state, incidental: Secondary | ICD-10-CM

## 2012-04-13 LAB — HEMOGLOBIN: Hemoglobin: 11.1 g/dL — ABNORMAL LOW (ref 12.0–15.0)

## 2012-04-13 NOTE — Progress Notes (Signed)
[redacted]w[redacted]d Pt voices no complaints. Pt denies ha's, dizziness, or visual changes.  Ultrasound shows:  SIUP  S=D     [place] EDD: 07/11/2012            AFI: 14.4cm=50th%tile           Cervical length: 3.49 cm           Placenta localization: anterior           Fetal presentation: cephalic           Normal growth           Anatomy survey is normal           Gender : female  Fetal spine is visualized today and appears normal. This completes anatomy survey. Fetal kidneys are normal today. No pyelectasis seen.  Declines flu shot

## 2012-04-14 LAB — RPR

## 2012-04-14 LAB — GLUCOSE TOLERANCE, 1 HOUR (50G) W/O FASTING: Glucose, 1 Hour GTT: 117 mg/dL (ref 70–140)

## 2012-04-14 NOTE — L&D Delivery Note (Signed)
Delivery Note At 12:01 PM a viable female, "Heather Mckenzie", was delivered via Vaginal, Spontaneous Delivery (Presentation: OA), while patient was on the toilet, but was attended by myself and the RN.Marland Kitchen  APGAR: 8, 9; weight .   Placenta status: Intact, Spontaneous.  Cord: 3 vessels with the following complications: None.  Cord pH: NA  GBS done today resulted = Negative.  Patient assisted back to bed after delivery, before placental expulsion.  Anesthesia: Lidocaine for repair, Stadol for patient comfort. Episiotomy: None Lacerations: 2nd degree laceration, with shearing effect at introitus--tissues reapproximated, ice pack applied after delivery. Suture Repair: 3.0 vicryl and 3.0 monocryl. Est. Blood Loss (mL):  300 cc  Mom to postpartum.  Baby to skin to skin.Marland Kitchen  Nigel Bridgeman 06/14/2012, 1:29 PM

## 2012-04-16 LAB — US OB FOLLOW UP

## 2012-04-28 ENCOUNTER — Ambulatory Visit: Payer: Medicaid Other | Admitting: Obstetrics and Gynecology

## 2012-04-28 VITALS — BP 100/62 | Wt 208.0 lb

## 2012-04-28 DIAGNOSIS — O09899 Supervision of other high risk pregnancies, unspecified trimester: Secondary | ICD-10-CM

## 2012-04-28 NOTE — Progress Notes (Signed)
[redacted]w[redacted]d Doing well No complaints Reviewed labs from last visit incl nl GCT FKCs RTO 2wks If s>d persists rec f/u u/s to check EFW

## 2012-05-14 ENCOUNTER — Ambulatory Visit: Payer: Medicaid Other | Admitting: Obstetrics and Gynecology

## 2012-05-14 VITALS — BP 122/62 | Wt 211.0 lb

## 2012-05-14 DIAGNOSIS — O09899 Supervision of other high risk pregnancies, unspecified trimester: Secondary | ICD-10-CM

## 2012-05-14 NOTE — Progress Notes (Signed)
[redacted]w[redacted]d No complaints FKCs  RTO 2wks

## 2012-05-27 ENCOUNTER — Encounter: Payer: Medicaid Other | Admitting: Obstetrics and Gynecology

## 2012-06-01 ENCOUNTER — Encounter: Payer: Medicaid Other | Admitting: Obstetrics and Gynecology

## 2012-06-10 ENCOUNTER — Ambulatory Visit: Payer: Medicaid Other | Admitting: Certified Nurse Midwife

## 2012-06-10 VITALS — BP 110/60 | Wt 216.0 lb

## 2012-06-10 DIAGNOSIS — Z331 Pregnant state, incidental: Secondary | ICD-10-CM

## 2012-06-10 NOTE — Progress Notes (Signed)
Pt stated no issues today. Pt was wondering if she can do her beta strep next visit and that is on 06/15/2012.

## 2012-06-10 NOTE — Progress Notes (Signed)
[redacted]w[redacted]d Pt feeling well.  GFM Discussed GBS testing due today -- pt has an appt Tues and would prefer to do it then Discussed contraceptive options available for after the pregnancy.  Was taking OCPs when she got pregnant so is considering IUD. Vtx by [employer]

## 2012-06-14 ENCOUNTER — Inpatient Hospital Stay (HOSPITAL_COMMUNITY)
Admission: AD | Admit: 2012-06-14 | Discharge: 2012-06-16 | DRG: 775 | Disposition: A | Discharge status: Home / Self Care | Payer: Medicaid Other | Source: Ambulatory Visit | Attending: Obstetrics and Gynecology | Admitting: Obstetrics and Gynecology

## 2012-06-14 ENCOUNTER — Encounter (HOSPITAL_COMMUNITY): Payer: Self-pay | Admitting: *Deleted

## 2012-06-14 ENCOUNTER — Telehealth: Payer: Self-pay | Admitting: Obstetrics and Gynecology

## 2012-06-14 ENCOUNTER — Encounter: Payer: Medicaid Other | Admitting: Certified Nurse Midwife

## 2012-06-14 DIAGNOSIS — Z8619 Personal history of other infectious and parasitic diseases: Secondary | ICD-10-CM

## 2012-06-14 DIAGNOSIS — O9903 Anemia complicating the puerperium: Secondary | ICD-10-CM | POA: Diagnosis not present

## 2012-06-14 DIAGNOSIS — O09293 Supervision of pregnancy with other poor reproductive or obstetric history, third trimester: Secondary | ICD-10-CM

## 2012-06-14 DIAGNOSIS — D649 Anemia, unspecified: Secondary | ICD-10-CM | POA: Diagnosis not present

## 2012-06-14 DIAGNOSIS — IMO0001 Reserved for inherently not codable concepts without codable children: Secondary | ICD-10-CM

## 2012-06-14 LAB — RPR: RPR Ser Ql: NONREACTIVE

## 2012-06-14 LAB — GROUP B STREP BY PCR: Group B strep by PCR: NEGATIVE

## 2012-06-14 LAB — CBC
HCT: 33.3 % — ABNORMAL LOW (ref 36.0–46.0)
Hemoglobin: 10.7 g/dL — ABNORMAL LOW (ref 12.0–15.0)
MCH: 25.8 pg — ABNORMAL LOW (ref 26.0–34.0)
MCHC: 32.1 g/dL (ref 30.0–36.0)
MCV: 80.4 fL (ref 78.0–100.0)
Platelets: 375 10*3/uL (ref 150–400)
RBC: 4.14 MIL/uL (ref 3.87–5.11)
RDW: 13.9 % (ref 11.5–15.5)
WBC: 13.3 10*3/uL — ABNORMAL HIGH (ref 4.0–10.5)

## 2012-06-14 MED ORDER — OXYCODONE-ACETAMINOPHEN 5-325 MG PO TABS: 1.0000 | ORAL_TABLET | ORAL | Status: DC | PRN

## 2012-06-14 MED ORDER — LORATADINE 10 MG PO TABS
10.0000 mg | ORAL_TABLET | Freq: Every day | ORAL | Status: DC
  Administered 2012-06-14 – 2012-06-16 (×3): 10 mg via ORAL
  Filled 2012-06-14 (×3): qty 1

## 2012-06-14 MED ORDER — SODIUM CHLORIDE 0.9 % IV SOLN
2.0000 g | Freq: Once | INTRAVENOUS | Status: AC
  Administered 2012-06-14: 2 g via INTRAVENOUS
  Filled 2012-06-14: qty 2000

## 2012-06-14 MED ORDER — OXYTOCIN BOLUS FROM INFUSION: 500.0000 mL | INTRAVENOUS | Status: DC

## 2012-06-14 MED ORDER — LACTATED RINGERS IV SOLN: 500.0000 mL | INTRAVENOUS | Status: DC | PRN

## 2012-06-14 MED ORDER — LIDOCAINE HCL (PF) 1 % IJ SOLN
30.0000 mL | INTRAMUSCULAR | Status: AC | PRN
  Administered 2012-06-14 (×2): 30 mL via SUBCUTANEOUS
  Filled 2012-06-14 (×2): qty 30

## 2012-06-14 MED ORDER — DIBUCAINE 1 % RE OINT: 1.0000 "application " | TOPICAL_OINTMENT | RECTAL | Status: DC | PRN

## 2012-06-14 MED ORDER — IBUPROFEN 600 MG PO TABS
600.0000 mg | ORAL_TABLET | Freq: Four times a day (QID) | ORAL | Status: DC
  Administered 2012-06-14 – 2012-06-15 (×6): 600 mg via ORAL
  Filled 2012-06-14 (×7): qty 1

## 2012-06-14 MED ORDER — OXYTOCIN 40 UNITS IN LACTATED RINGERS INFUSION - SIMPLE MED
62.5000 mL/h | INTRAVENOUS | Status: DC
  Administered 2012-06-14: 62.5 mL/h via INTRAVENOUS
  Filled 2012-06-14: qty 1000

## 2012-06-14 MED ORDER — SENNOSIDES-DOCUSATE SODIUM 8.6-50 MG PO TABS
2.0000 | ORAL_TABLET | Freq: Every day | ORAL | Status: DC
  Administered 2012-06-14 – 2012-06-15 (×2): 2 via ORAL

## 2012-06-14 MED ORDER — ZOLPIDEM TARTRATE 5 MG PO TABS: 5.0000 mg | ORAL_TABLET | Freq: Every evening | ORAL | Status: DC | PRN

## 2012-06-14 MED ORDER — IBUPROFEN 600 MG PO TABS: 600.0000 mg | ORAL_TABLET | Freq: Four times a day (QID) | ORAL | Status: DC | PRN

## 2012-06-14 MED ORDER — DIPHENHYDRAMINE HCL 25 MG PO CAPS: 25.0000 mg | ORAL_CAPSULE | Freq: Four times a day (QID) | ORAL | Status: DC | PRN

## 2012-06-14 MED ORDER — ONDANSETRON HCL 4 MG/2ML IJ SOLN: 4.0000 mg | Freq: Four times a day (QID) | INTRAMUSCULAR | Status: DC | PRN

## 2012-06-14 MED ORDER — BENZOCAINE-MENTHOL 20-0.5 % EX AERO
1.0000 "application " | INHALATION_SPRAY | CUTANEOUS | Status: DC | PRN
  Administered 2012-06-14: 1 via TOPICAL
  Filled 2012-06-14: qty 56

## 2012-06-14 MED ORDER — NALOXONE HCL 0.4 MG/ML IJ SOLN
INTRAMUSCULAR | Status: AC
  Filled 2012-06-14: qty 1

## 2012-06-14 MED ORDER — ONDANSETRON HCL 4 MG/2ML IJ SOLN: 4.0000 mg | INTRAMUSCULAR | Status: DC | PRN

## 2012-06-14 MED ORDER — ONDANSETRON HCL 4 MG PO TABS: 4.0000 mg | ORAL_TABLET | ORAL | Status: DC | PRN

## 2012-06-14 MED ORDER — ACETAMINOPHEN 325 MG PO TABS
650.0000 mg | ORAL_TABLET | ORAL | Status: DC | PRN
Start: 2012-06-14 — End: 2012-06-14

## 2012-06-14 MED ORDER — LACTATED RINGERS IV SOLN
INTRAVENOUS | Status: DC
  Administered 2012-06-14: 10:00:00 via INTRAVENOUS

## 2012-06-14 MED ORDER — BUTORPHANOL TARTRATE 1 MG/ML IJ SOLN
1.0000 mg | INTRAMUSCULAR | Status: DC | PRN
  Administered 2012-06-14 (×2): 1 mg via INTRAVENOUS
  Filled 2012-06-14 (×2): qty 1

## 2012-06-14 MED ORDER — LANOLIN HYDROUS EX OINT: TOPICAL_OINTMENT | CUTANEOUS | Status: DC | PRN

## 2012-06-14 MED ORDER — FLEET ENEMA 7-19 GM/118ML RE ENEM: 1.0000 | ENEMA | RECTAL | Status: DC | PRN

## 2012-06-14 MED ORDER — PRENATAL MULTIVITAMIN CH
1.0000 | ORAL_TABLET | Freq: Every day | ORAL | Status: DC
  Administered 2012-06-15: 1 via ORAL
  Filled 2012-06-14: qty 1

## 2012-06-14 MED ORDER — CITRIC ACID-SODIUM CITRATE 334-500 MG/5ML PO SOLN: 30.0000 mL | ORAL | Status: DC | PRN

## 2012-06-14 MED ORDER — TETANUS-DIPHTH-ACELL PERTUSSIS 5-2.5-18.5 LF-MCG/0.5 IM SUSP: 0.5000 mL | Freq: Once | INTRAMUSCULAR | Status: DC

## 2012-06-14 MED ORDER — WITCH HAZEL-GLYCERIN EX PADS: 1.0000 "application " | MEDICATED_PAD | CUTANEOUS | Status: DC | PRN

## 2012-06-14 MED ORDER — SIMETHICONE 80 MG PO CHEW: 80.0000 mg | CHEWABLE_TABLET | ORAL | Status: DC | PRN

## 2012-06-14 NOTE — MAU Note (Signed)
C/o SROM @ 0830 and ucs
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
## 2012-06-14 NOTE — Telephone Encounter (Signed)
Pt called, is [redacted]w[redacted]d and states that she was awakened by a pop and a gush of fluid flowing out, thinks her membranes ruptured.  Pt denies any contractions at this time and says that last pm she had some mucusy d/c that was blood tinged.  Just woke up so had not yet felt the baby move.  Pt advised to come into the office for an eval @ 0930 w/ JEO.  ON the way to the office pt called back and states that she is pretty sure her water broke because her pants continue to get wet.  VL made aware with all pertinent information and pt advised to go to [facility] instead, pt voices agreement.

## 2012-06-15 ENCOUNTER — Encounter: Payer: Medicaid Other | Admitting: Obstetrics and Gynecology

## 2012-06-15 LAB — CBC
HCT: 25.2 % — ABNORMAL LOW (ref 36.0–46.0)
Hemoglobin: 8.2 g/dL — ABNORMAL LOW (ref 12.0–15.0)
MCH: 26.3 pg (ref 26.0–34.0)
MCHC: 32.5 g/dL (ref 30.0–36.0)
MCV: 80.8 fL (ref 78.0–100.0)
Platelets: 330 10*3/uL (ref 150–400)
RBC: 3.12 MIL/uL — ABNORMAL LOW (ref 3.87–5.11)
RDW: 13.9 % (ref 11.5–15.5)
WBC: 16.4 10*3/uL — ABNORMAL HIGH (ref 4.0–10.5)

## 2012-06-15 MED ORDER — FERROUS SULFATE 325 (65 FE) MG PO TABS
325.0000 mg | ORAL_TABLET | Freq: Every day | ORAL | Status: DC
  Administered 2012-06-16: 325 mg via ORAL
  Filled 2012-06-15: qty 1

## 2012-06-15 NOTE — Progress Notes (Signed)
UR chart review completed.  

## 2012-06-15 NOTE — Progress Notes (Signed)
Post Partum Day 1:S/P SVD by VL with 2nd Degree lac  Subjective: Patient up ad lib, denies syncope or dizziness. Feeding:  bottle Contraceptive plan:   Mirena  Objective: Blood pressure 108/71, pulse 83, temperature 97.8 F (36.6 C), temperature source Oral, resp. rate 18, height 5\' 3"  (1.6 m), weight 211 lb (95.709 kg), last menstrual period 10/05/2011, SpO2 97.00%, unknown if currently breastfeeding.  Physical Exam:  General: alert Lochia: appropriate Uterine Fundus: firm Incision: healing well DVT Evaluation: No evidence of DVT seen on physical exam. Negative Homan's sign.   Recent Labs  06/14/12 1010 06/15/12 0510  HGB 10.7* 8.2*  HCT 33.3* 25.2*    Assessment/Plan: S/P Vaginal delivery day 1 Anemia without hemodynamic instability Orthostatics Repeat CBC tomorrow FESO4 Continue current care Plan for discharge tomorrow   LOS: 1 day   Blaklee [place] 06/15/2012, 11:22 AM

## 2012-06-16 LAB — CBC
HCT: 23.6 % — ABNORMAL LOW (ref 36.0–46.0)
Hemoglobin: 7.5 g/dL — ABNORMAL LOW (ref 12.0–15.0)
MCH: 26 pg (ref 26.0–34.0)
MCHC: 31.8 g/dL (ref 30.0–36.0)
MCV: 81.7 fL (ref 78.0–100.0)
Platelets: 322 10*3/uL (ref 150–400)
RBC: 2.89 MIL/uL — ABNORMAL LOW (ref 3.87–5.11)
RDW: 14.1 % (ref 11.5–15.5)
WBC: 10.9 10*3/uL — ABNORMAL HIGH (ref 4.0–10.5)

## 2012-06-16 MED ORDER — IBUPROFEN 600 MG PO TABS: 600.0000 mg | ORAL_TABLET | Freq: Four times a day (QID) | ORAL | Status: DC | PRN

## 2012-06-16 MED ORDER — OXYCODONE-ACETAMINOPHEN 5-325 MG PO TABS: 1.0000 | ORAL_TABLET | ORAL | Status: DC | PRN

## 2012-06-16 NOTE — Discharge Summary (Signed)
  Vaginal Delivery Discharge Summary  Heather Mckenzie  DOB:    30-Aug-1980 MRN:    [id number] CSN:    [id number]  Date of admission:                  06/14/12  Date of discharge:                   06/16/12  Procedures this admission:  SVD 2nd degree lac with repair  Newborn Data:  Live born female  Birth Weight: 6 lb 8.4 oz (2960 g) APGAR: 8, 9  Home with mother. Name: "Heather Mckenzie"  History of Present Illness:  [name] is a 32 y.o. female, G2P1102, who presents at [redacted]w[redacted]d weeks gestation. The patient has been followed at the [facility] division of [facility]. She was admitted rupture of membranes. Her pregnancy has been complicated by:   Patient Active Problem List  Diagnosis  . Short interval between pregnancies complicating pregnancy, antepartum  . FHX of NTD  . Rapid first stage of labor  . Vaginal delivery  . Perineal laceration with delivery, second degree     Hospital course:  The patient was admitted for SROM at 36 weeks with onset of labor soon after.   Her labor was not complicated. She proceeded to have a precipitous vaginal delivery of a healthy infant. Her delivery was not complicated. Her postpartum course was complicated by anemia but no hemodynamic instability; declined transfusion. She was discharged to home on postpartum day 2 doing well.  Will do OTC FE.  Feeding:  bottle  Contraception:  IUD - Mirena  Discharge hemoglobin:  Hemoglobin  Date Value Range Status  06/16/2012 7.5* 12.0 - 15.0 g/dL Final     HCT  Date Value Range Status  06/16/2012 23.6* 36.0 - 46.0 % Final  Admit hgb 10.7 and 8.2 on day 1  Discharge Physical Exam:   General: alert Lochia: appropriate Uterine Fundus: firm Incision: healing well DVT Evaluation: No evidence of DVT seen on physical exam. Negative Homan's sign.  Intrapartum Procedures: spontaneous vaginal delivery Postpartum Procedures:  none Complications-Operative and Postpartum: 2nd degree perineal laceration  Discharge Diagnoses: Preterm delivery; anemia  Discharge Information:  Activity:           pelvic rest Diet:                routine Medications: Ibuprofen, Iron and Percocet Condition:      stable Instructions:  refer to practice specific booklet Discharge to: home  Follow-up Information   Follow up with [facility]. Schedule an appointment as soon as possible for a visit in 5 weeks. (Call with any questions or concerns)    Contact information:   [address] [place] [address] [phone]       Nigel Bridgeman 06/16/2012

## 2012-06-21 ENCOUNTER — Encounter: Payer: Medicaid Other | Admitting: Certified Nurse Midwife

## 2012-06-21 ENCOUNTER — Telehealth: Payer: Self-pay | Admitting: Obstetrics and Gynecology

## 2012-06-21 NOTE — Telephone Encounter (Signed)
TC from pt. States S/P vaginal delivery 06/14/12. Just passed quarter size clot. Is changing  Pad which is not full q 3h. States had been sitting for some time prior to  Clot. Explained blood may pool in vagina and form clot. To call if bleeding one or > pad/hr, lg clot, or other concerns. Pt verbalizes comprehension. Congrats given.

## 2012-07-22 ENCOUNTER — Encounter: Payer: Self-pay | Admitting: Obstetrics and Gynecology

## 2014-02-13 ENCOUNTER — Encounter (HOSPITAL_COMMUNITY): Payer: Self-pay | Admitting: *Deleted

## 2015-05-04 ENCOUNTER — Ambulatory Visit: Payer: Self-pay | Admitting: Podiatry

## 2019-04-12 ENCOUNTER — Other Ambulatory Visit: Payer: Self-pay | Admitting: Obstetrics & Gynecology

## 2019-04-12 DIAGNOSIS — N6320 Unspecified lump in the left breast, unspecified quadrant: Secondary | ICD-10-CM

## 2019-04-21 ENCOUNTER — Ambulatory Visit
Admission: RE | Admit: 2019-04-21 | Discharge: 2019-04-21 | Disposition: A | Discharge status: Home / Self Care | Payer: Self-pay | Source: Ambulatory Visit | Attending: Obstetrics & Gynecology | Admitting: Obstetrics & Gynecology

## 2019-04-21 ENCOUNTER — Other Ambulatory Visit: Payer: Self-pay | Admitting: Obstetrics & Gynecology

## 2019-04-21 ENCOUNTER — Ambulatory Visit
Admission: RE | Admit: 2019-04-21 | Discharge: 2019-04-21 | Disposition: A | Discharge status: Home / Self Care | Payer: BC Managed Care – PPO | Source: Ambulatory Visit | Attending: Obstetrics & Gynecology | Admitting: Obstetrics & Gynecology

## 2019-04-21 ENCOUNTER — Other Ambulatory Visit: Payer: Self-pay

## 2019-04-21 DIAGNOSIS — N6320 Unspecified lump in the left breast, unspecified quadrant: Secondary | ICD-10-CM

## 2019-04-22 ENCOUNTER — Telehealth: Payer: Self-pay | Admitting: *Deleted

## 2019-04-22 NOTE — Telephone Encounter (Signed)
Left message for a return phone call to schedule for [facility]. 

## 2019-04-22 NOTE — Telephone Encounter (Signed)
Spoke with patient to confirm her appointment for 1/13 at 12:15pm.  Answered questions and gave contact information if questions arise before her appointment next week.

## 2019-04-25 ENCOUNTER — Other Ambulatory Visit: Payer: Self-pay | Admitting: *Deleted

## 2019-04-25 DIAGNOSIS — C50812 Malignant neoplasm of overlapping sites of left female breast: Secondary | ICD-10-CM

## 2019-04-25 DIAGNOSIS — Z171 Estrogen receptor negative status [ER-]: Secondary | ICD-10-CM

## 2019-04-26 DIAGNOSIS — C50919 Malignant neoplasm of unspecified site of unspecified female breast: Secondary | ICD-10-CM | POA: Insufficient documentation
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
## 2019-04-27 ENCOUNTER — Ambulatory Visit: Payer: Self-pay | Admitting: Surgery

## 2019-04-27 ENCOUNTER — Ambulatory Visit (HOSPITAL_BASED_OUTPATIENT_CLINIC_OR_DEPARTMENT_OTHER): Payer: 59 | Admitting: Licensed Clinical Social Worker

## 2019-04-27 ENCOUNTER — Encounter: Payer: Self-pay | Admitting: Physical Therapy

## 2019-04-27 ENCOUNTER — Encounter: Payer: Self-pay | Admitting: Licensed Clinical Social Worker

## 2019-04-27 ENCOUNTER — Encounter: Payer: Self-pay | Admitting: Hematology and Oncology

## 2019-04-27 ENCOUNTER — Other Ambulatory Visit: Payer: Self-pay

## 2019-04-27 ENCOUNTER — Inpatient Hospital Stay: Payer: 59

## 2019-04-27 ENCOUNTER — Ambulatory Visit: Payer: 59 | Attending: Surgery | Admitting: Physical Therapy

## 2019-04-27 ENCOUNTER — Inpatient Hospital Stay (HOSPITAL_BASED_OUTPATIENT_CLINIC_OR_DEPARTMENT_OTHER): Payer: 59 | Admitting: Hematology and Oncology

## 2019-04-27 ENCOUNTER — Ambulatory Visit
Admission: RE | Admit: 2019-04-27 | Discharge: 2019-04-27 | Disposition: A | Discharge status: Home / Self Care | Payer: 59 | Source: Ambulatory Visit | Attending: Radiation Oncology | Admitting: Radiation Oncology

## 2019-04-27 VITALS — BP 144/89 | HR 88 | Temp 98.3°F | Resp 17 | Ht 63.0 in | Wt 227.7 lb

## 2019-04-27 DIAGNOSIS — Z8042 Family history of malignant neoplasm of prostate: Secondary | ICD-10-CM | POA: Diagnosis not present

## 2019-04-27 DIAGNOSIS — C50812 Malignant neoplasm of overlapping sites of left female breast: Secondary | ICD-10-CM

## 2019-04-27 DIAGNOSIS — Z171 Estrogen receptor negative status [ER-]: Secondary | ICD-10-CM | POA: Insufficient documentation

## 2019-04-27 DIAGNOSIS — Z5189 Encounter for other specified aftercare: Secondary | ICD-10-CM | POA: Insufficient documentation

## 2019-04-27 DIAGNOSIS — Z79899 Other long term (current) drug therapy: Secondary | ICD-10-CM | POA: Insufficient documentation

## 2019-04-27 DIAGNOSIS — Z5111 Encounter for antineoplastic chemotherapy: Secondary | ICD-10-CM | POA: Insufficient documentation

## 2019-04-27 DIAGNOSIS — Z800 Family history of malignant neoplasm of digestive organs: Secondary | ICD-10-CM | POA: Diagnosis not present

## 2019-04-27 DIAGNOSIS — R293 Abnormal posture: Secondary | ICD-10-CM | POA: Diagnosis present

## 2019-04-27 DIAGNOSIS — Z5112 Encounter for antineoplastic immunotherapy: Secondary | ICD-10-CM | POA: Insufficient documentation

## 2019-04-27 LAB — CBC WITH DIFFERENTIAL (CANCER CENTER ONLY)
Abs Immature Granulocytes: 0.02 10*3/uL (ref 0.00–0.07)
Basophils Absolute: 0.1 10*3/uL (ref 0.0–0.1)
Basophils Relative: 1 %
Eosinophils Absolute: 0.1 10*3/uL (ref 0.0–0.5)
Eosinophils Relative: 1 %
HCT: 42.2 % (ref 36.0–46.0)
Hemoglobin: 13.7 g/dL (ref 12.0–15.0)
Immature Granulocytes: 0 %
Lymphocytes Relative: 28 %
Lymphs Abs: 3 10*3/uL (ref 0.7–4.0)
MCH: 27.9 pg (ref 26.0–34.0)
MCHC: 32.5 g/dL (ref 30.0–36.0)
MCV: 85.9 fL (ref 80.0–100.0)
Monocytes Absolute: 0.8 10*3/uL (ref 0.1–1.0)
Monocytes Relative: 7 %
Neutro Abs: 6.8 10*3/uL (ref 1.7–7.7)
Neutrophils Relative %: 63 %
Platelet Count: 440 10*3/uL — ABNORMAL HIGH (ref 150–400)
RBC: 4.91 MIL/uL (ref 3.87–5.11)
RDW: 13 % (ref 11.5–15.5)
WBC Count: 10.8 10*3/uL — ABNORMAL HIGH (ref 4.0–10.5)
nRBC: 0 % (ref 0.0–0.2)

## 2019-04-27 LAB — CMP (CANCER CENTER ONLY)
ALT: 19 U/L (ref 0–44)
AST: 18 U/L (ref 15–41)
Albumin: 3.6 g/dL (ref 3.5–5.0)
Alkaline Phosphatase: 111 U/L (ref 38–126)
Anion gap: 12 (ref 5–15)
BUN: 9 mg/dL (ref 6–20)
CO2: 23 mmol/L (ref 22–32)
Calcium: 8.8 mg/dL — ABNORMAL LOW (ref 8.9–10.3)
Chloride: 103 mmol/L (ref 98–111)
Creatinine: 0.71 mg/dL (ref 0.44–1.00)
GFR, Est AFR Am: >60 mL/min (ref >60)
GFR, Estimated: >60 mL/min (ref >60)
Glucose, Bld: 87 mg/dL (ref 70–99)
Potassium: 4 mmol/L (ref 3.5–5.1)
Sodium: 138 mmol/L (ref 135–145)
Total Bilirubin: 0.4 mg/dL (ref 0.3–1.2)
Total Protein: 7.4 g/dL (ref 6.5–8.1)

## 2019-04-27 LAB — GENETIC SCREENING ORDER

## 2019-04-27 NOTE — Assessment & Plan Note (Signed)
04/25/2019:Patient palpated a left breast and axillary mass x3month. [place] left breast showed a 3.6cm mass at the 1 o'clock position with 6 smaller adjacent masses extending toward the nipple ranging in size from 1.1cm to 4.0cm. [place] left axilla showed five bulky lymph nodes, the largest measuring 4.2cm, and second largest measuring 2.8cm. 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   T1c N2 stage IIIc  Pathology and radiology counseling: Discussed with the patient, the details of pathology including the type of breast cancer,the clinical staging, the significance of ER, PR and HER-2/neu receptors and the implications for treatment. After reviewing the pathology in detail, we proceeded to discuss the different treatment options between surgery, radiation, chemotherapy, antiestrogen therapies.  Recommendation based on multidisciplinary tumor board: 1. Neoadjuvant chemotherapy with Adriamycin and Cytoxan every 3 weeks 4 followed by Taxol weekly 12 with carboplatin every 3 weeks and based on keynote 522 clinical trial we will add pembrolizumab every 3 weeks 2. Followed by mastectomy and axillary lymph node dissection 3. Followed by adjuvant radiation therapy  Chemotherapy Counseling: I discussed the risks and benefits of chemotherapy including the risks of nausea/ vomiting, risk of infection from low WBC count, fatigue due to chemo or anemia, bruising or bleeding due to low platelets, mouth sores, loss/ change in taste and decreased appetite. Liver and kidney function will be monitored through out chemotherapy as abnormalities in liver and kidney function may be a side effect of treatment. Cardiac dysfunction due to Adriamycin was discussed in detail.  Neuropathy risk due to Taxol was discussed risk of permanent bone marrow dysfunction due to chemo were also discussed.  Pembrolizumab counseling: Discussed the risks and benefits of adding immunotherapy to  her chemotherapy treatment including the risk of immune mediated adverse effects like hypothyroidism pituitary insufficiency as well as hepatitis, colitis, dermatitis as well as inflammation of the body parts.  Plan: 1. Port placement to be done next Monday 2. Echocardiogram 3. Chemotherapy class 4. Breast MRI 5. CT chest abdomen pelvis and bone scan for staging Genetic counseling will also be arranged    Return to clinic in 1 week to start chemotherapy.

## 2019-04-27 NOTE — H&P (Signed)
Heather Mckenzie Documented: 04/27/2019 7:36 AM Location: [facility] Patient #: [id number] DOB: [birth year] Undefined / Language: [name] / Race: White Female  History of Present Illness Marcello Moores A. Brogan Martis MD; 04/27/2019 3:10 PM) Patient words: Pt sent at the request of Dr Radford Pax for left breast mass x 3 months. Pt noted mass in left breast last september. It has grown since then. No pain, discharge or skin dimpling. 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                    ADDITIONAL INFORMATION: 1. PROGNOSTIC INDICATORS Results: IMMUNOHISTOCHEMICAL AND MORPHOMETRIC ANALYSIS PERFORMED MANUALLY The tumor cells are NEGATIVE for Her2 (1+). Estrogen Receptor: 0%, NEGATIVE Progesterone Receptor: 0%, NEGATIVE Proliferation Marker Ki67: 90% COMMENT: The negative hormone receptor study(ies) in this case has AN internal positive control. REFERENCE RANGE ESTROGEN RECEPTOR NEGATIVE 0% POSITIVE =>1% REFERENCE RANGE PROGESTERONE RECEPTOR NEGATIVE 0% POSITIVE =>1% All controls stained appropriately Vicente Males MD Pathologist, Electronic Signature ( Signed 04/25/2019) 3. PROGNOSTIC INDICATORS Results: IMMUNOHISTOCHEMICAL AND MORPHOMETRIC ANALYSIS PERFORMED MANUALLY The tumor cells are NEGATIVE for Her2 (1+). 1 of 3 FINAL for Teresi, Elverna ([id number]) ADDITIONAL INFORMATION:(continued) Estrogen Receptor: 0%, NEGATIVE Progesterone Receptor: 0%, NEGATIVE Proliferation Marker Ki67: 80% COMMENT: The negative hormone receptor study(ies) in this case has NO internal positive control. REFERENCE RANGE ESTROGEN RECEPTOR NEGATIVE 0% POSITIVE =>1% REFERENCE RANGE PROGESTERONE RECEPTOR NEGATIVE 0% POSITIVE =>1% All controls stained appropriately Vicente Males MD Pathologist, Electronic Signature ( Signed 04/25/2019) FINAL DIAGNOSIS Diagnosis 1. Breast, left, needle core  biopsy, 10 o'clock, 7cmfn - INVASIVE DUCTAL CARCINOMA - SEE COMMENT 2. Breast, left, needle core biopsy, 1 o'clock, 6cmfn - INVASIVE DUCTAL CARCINOMA - SEE COMMENT 3. Lymph node, needle/core biopsy, left axilla - METASTATIC CARCINOMA INVOLVING A LYMPH NODE - SEE COMMENT Microscopic Comment 1. and 2. The morphology and parts 1 and 2 are similar. Based on the biopsy, the carcinoma appears Nottingham grade 3 of 3 and measures 1.0 cm in greatest linear extent. Prognostic markers (ER/PR/ki-67/HER2) are pending and will be reported in an addendum. Dr. Jeannie Done reviewed the case and agrees with the above diagnosis. These results were called to [facility] on April 22, 2019. 3. Prognostic (ER, PR , ki-67 and HER-2) markers are pending and will be reported in an addendum Thressa Sheller MD Pathologist, Electronic Signature (Case signed 04/22/2019) Specimen Gross and Clinical Information 39 year old patient had 3 ultrasound-guided core needle biopsies performed today.  EXAM: DIAGNOSTIC LEFT MAMMOGRAM POST ULTRASOUND BIOPSIES  COMPARISON: Previous exam(s).  FINDINGS: Mammographic images were obtained following ULTRASOUND guided biopsy of 2 LEFT BREAST MASSES AND A LEFT AXILLARY LYMPH NODE.  The ribbon shaped biopsy clip is satisfactorily positioned in the biopsied upper inner quadrant left breast mass, 10 o'clock position.  Coil shaped biopsy clip is satisfactorily positioned in the upper outer quadrant 1 o'clock position biopsied mass.  Q shaped biopsy clip satisfactorily positioned within the biopsied lymph node.  IMPRESSION: Appropriate positioning of 3 marking clips at the site of 2 breast biopsies and a left axillary lymph node biopsy.  Final Assessment: Post Procedure Mammograms for Marker Placement   Electronically Signed By: Curlene Dolphin M.D. On: 04/22/2019 68:28  39 year old patient was recently evaluated at [facility] with bilateral  mammography and left breast ultrasound. She presents today for left breast and axillary lymph node biopsies. An ultrasound is being performed to  document the left breast masses and number of suspicious axillary lymph nodes, prior to today's biopsies.  The patient has noticed a mass approximately 3 months ago in the upper outer left breast, 1 o'clock axis. Since she first noticed it, it feels to her like it has changed in configuration, as if it has broken into 2 adjacent masses. She has also palpated a lump in the left axilla that is sometimes tender.  EXAM: ULTRASOUND OF THE LEFT BREAST  COMPARISON: Outside imaging from [facility].  FINDINGS: On physical exam, I palpate bulky lymphadenopathy in the inferior left axilla.  There is a dominant mass in the 1 o'clock axis of the left breast approximately 6 cm from the nipple, with smaller palpable masses extending in a segmental fashion toward the nipple in the 1 o'clock axis. There is also a palpable lump in the upper inner quadrant of the left breast at 10 o'clock position approximately 7 cm from the nipple. The skin of the left breast and the nipple areolar complex appear normal.  Targeted ultrasound is performed, showing multiple masses in the left breast as described below:  -At 1 o'clock position 6 cm from nipple is a dominant palpable irregular mixed echogenicity mass with central hypoechogenicity and peripheral increased echogenicity and some internal Mckenzie flow. This mass measures 3.6 x 2.4 x 2.0 cm.  -At 1 o'clock position 5 cm from the nipple is a similar appearing heterogeneous but more superficially positioned mass measuring 1.5 x 1.0 x 0.9 cm.  -At 1 o'clock position 3 cm from the nipple is a heterogeneously hypoechoic mass or conglomeration of masses with peripheral echogenicity. This area measures approximately 4.0 x 1.9 x 3.0 cm.  -At 1 o'clock position 2 cm from the nipple is a  palpable heterogeneously hypoechoic mass measuring 1.0 x 1.0 x 0.8 cm.  -At 1:30 position is a mixed hypoechoic and hyperechoic irregular mass measuring 1.1 x 0.8 x 0.9 cm.  -At 10 o'clock position 7 cm from nipple is a heterogeneously hypoechoic mass measuring 1.6 x 1.3 x 0.9 cm.  -At 11 o'clock position 8 cm from nipple is an irregular heterogeneously hypoechoic and hyperechoic mass measuring 1.4 x 0.6 x 0.6 cm.  Ultrasound of the left axilla shows multiple bulky lymph nodes with hypoechoic and diffusely thickened cortices. Five discrete abnormal axillary lymph nodes are identified in the left axilla. Largest axillary lymph node measures 4.2 x 1.6 x 2.0 cm. At least half of the cortex is markedly thickened, measuring up to 1.3 cm. Portions of the cortex remain thin and there is some fatty hilum. The second largest lymph node measures 1.8 x 2.6 x 2.8 cm and has no visible fatty hilum. Three additional smaller but abnormal lymph nodes with hypoechoic diffusely thickened cortices are identified.  IMPRESSION: Findings suspicious for multifocal and multicentric malignancy in the left breast involving the upper outer and upper inner quadrants. Five pathologic lymph nodes are identified in the left axilla.  RECOMMENDATION: Ultrasound-guided biopsies (a total of 3) of the dominant palpable mass in the 1 o'clock axis of the left breast 6 cm from the nipple, of the mass at 10 o'clock position 7 cm from the nipple, and of a suspicious axillary lymph node is recommended and will be performed today and dictated separately. Today's findings were discussed in detail with the patient.  I have discussed the findings and recommendations with the patient. If applicable, a reminder letter will be sent to the patient regarding the next appointment.  BI-RADS CATEGORY 5: Highly suggestive of malignancy.   Electronically Signed By: Curlene Dolphin M.D. On: 04/21/2019 16:56.  The patient is  a 39 year old female.   Past Surgical History Tawni Pummel, RN; 04/27/2019 7:36 AM) Oral Surgery  Diagnostic Studies History Tawni Pummel, RN; 04/27/2019 7:36 AM) Colonoscopy never Mammogram within last year Pap Smear 1-5 years ago  Medication History Tawni Pummel, RN; 04/27/2019 7:37 AM) Medications Reconciled  Social History Tawni Pummel, RN; 04/27/2019 7:36 AM) Caffeine use Tea. No alcohol use No drug use Tobacco use Never smoker.  Family History Tawni Pummel, RN; 04/27/2019 7:36 AM) Kidney Disease Father.  Pregnancy / Birth History Tawni Pummel, RN; 04/27/2019 7:36 AM) Age at menarche 41 years. Contraceptive History Intrauterine device, Oral contraceptives. Gravida 2 Maternal age 31-30 Para 2 Regular periods  Other Problems Tawni Pummel, RN; 04/27/2019 7:36 AM) Lump In Breast     Review of Systems Tawni Pummel RN; 04/27/2019 7:36 AM) General Not Present- Appetite Loss, Chills, Fatigue, Fever, Night Sweats, Weight Gain and Weight Loss. Skin Not Present- Change in Wart/Mole, Dryness, Hives, Jaundice, New Lesions, Non-Healing Wounds, Rash and Ulcer. HEENT Present- Seasonal Allergies and Wears glasses/contact lenses. Not Present- Earache, Hearing Loss, Hoarseness, Nose Bleed, Oral Ulcers, Ringing in the Ears, Sinus Pain, Sore Throat, Visual Disturbances and Yellow Eyes. Respiratory Not Present- Bloody sputum, Chronic Cough, Difficulty Breathing, Snoring and Wheezing. Breast Present- Breast Mass. Not Present- Breast Pain, Nipple Discharge and Skin Changes. Cardiovascular Not Present- Chest Pain, Difficulty Breathing Lying Down, Leg Cramps, Palpitations, Rapid Heart Rate, Shortness of Breath and Swelling of Extremities. Gastrointestinal Not Present- Abdominal Pain, Bloating, Bloody Stool, Change in Bowel Habits, Chronic diarrhea, Constipation, Difficulty Swallowing, Excessive gas, Gets full quickly at meals, Hemorrhoids, Indigestion, Nausea,  Rectal Pain and Vomiting. Female Genitourinary Not Present- Frequency, Nocturia, Painful Urination, Pelvic Pain and Urgency. Musculoskeletal Not Present- Back Pain, Joint Pain, Joint Stiffness, Muscle Pain, Muscle Weakness and Swelling of Extremities. Neurological Not Present- Decreased Memory, Fainting, Headaches, Numbness, Seizures, Tingling, Tremor, Trouble walking and Weakness. Psychiatric Not Present- Anxiety, Bipolar, Change in Sleep Pattern, Depression, Fearful and Frequent crying. Endocrine Not Present- Cold Intolerance, Excessive Hunger, Hair Changes, Heat Intolerance, Hot flashes and New Diabetes. Hematology Not Present- Mckenzie Thinners, Easy Bruising, Excessive bleeding, Gland problems, HIV and Persistent Infections.   Physical Exam (Skylor Schnapp A. Ysidra Sopher MD; 04/27/2019 3:12 PM)  General Mental Status-Alert. General Appearance-Consistent with stated age. Hydration-Well hydrated. Voice-Normal.  Chest and Lung Exam Note: WOB normal no distress  Breast Note: MULTIPLE LEFT BREAST MASSES AT 10 OCLOCK AND 1 OCLOCK 3.6 CM AT 1 AND 1.6 CM AT 10  BOTH MOBILE LARGE AMOUNT OF ptosis    right normal  Cardiovascular Note: NSR  Neurologic Neurologic evaluation reveals -alert and oriented x 3 with no impairment of recent or remote memory. Mental Status-Normal.  Musculoskeletal Normal Exam - Left-Upper Extremity Strength Normal and Lower Extremity Strength Normal. Normal Exam - Right-Upper Extremity Strength Normal and Lower Extremity Strength Normal.  Lymphatic Head & Neck  General Head & Neck Lymphatics: Bilateral - Description - Normal. Axillary -Note:multiple enlarged left axillary LN matted but mobile.     Assessment & Plan (Husna Krone A. Jett Fukuda MD; 04/27/2019 3:15 PM)  BREAST CANCER, LEFT (C50.912) Impression: multifocal may need MRM in the future unless she has complete response which gives a small shot at conservation MRI chemo radiation to  follow needs port Pt requires port placement for chemotherapy. Risk include bleeding, infection, pneumothorax, hemothorax, mediastinal injury, nerve injury , Mckenzie  vessel injury, stroke, Mckenzie clots, death, migration. embolization and need for additional procedures. Pt agrees to proceed.    45 min total time  Current Plans Pt Education - [facility] Portacath [facility] Use of a central venous catheter for intravenous therapy was discussed. Technique of catheter placement using ultrasound and fluoroscopy guidance was discussed. Risks such as bleeding, infection, pneumothorax, catheter occlusion, reoperation, and other risks were discussed. I noted a good likelihood this will help address the problem. Questions were answered. The patient expressed understanding & wishes to proceed. You are being scheduled for surgery- Our schedulers will call you.  You should hear from our office's scheduling department within 5 working days about the location, date, and time of surgery. We try to make accommodations for patient's preferences in scheduling surgery, but sometimes the OR schedule or the surgeon's schedule prevents [place] from making those accommodations.  If you have not heard from our office [phone]) in 5 working days, call the office and ask for your surgeon's nurse.  If you have other questions about your diagnosis, plan, or surgery, call the office and ask for your surgeon's nurse.  Pt Education - [facility] Breast Cancer Information Given - [employer] "Breast Journey" Package
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
## 2019-04-27 NOTE — Progress Notes (Signed)
START OFF PATHWAY REGIMEN - Breast   [id number]:AC-T (Doxorubicin + Cyclophosphamide q21 Days Followed by Paclitaxel Weekly q7 Days):   Cycles 1 through 4: A cycle is every 21 days:     Doxorubicin      Cyclophosphamide    Cycles 5 through 16: A cycle is every 7 days:     Paclitaxel   **Always confirm dose/schedule in your pharmacy ordering system**  [id number]:Pembrolizumab 200 mg q21 Days:   A cycle is 21 days:     Pembrolizumab   **Always confirm dose/schedule in your pharmacy ordering system**  Patient Characteristics: Preoperative or Nonsurgical Candidate (Clinical Staging), Neoadjuvant Therapy followed by Surgery, Invasive Disease, Chemotherapy, HER2 Negative/Unknown/Equivocal, ER Negative/Unknown, Platinum Therapy Indicated Therapeutic Status: Preoperative or Nonsurgical Candidate (Clinical Staging) AJCC M Category: cM0 AJCC Grade: G3 Breast Surgical Plan: Neoadjuvant Therapy followed by Surgery ER Status: Negative (-) AJCC 8 Stage Grouping: IIIC HER2 Status: Negative (-) AJCC T Category: cT1c AJCC N Category: cN2a PR Status: Negative (-) Type of Therapy: Platinum Therapy Indicated Intent of Therapy: Curative Intent, Discussed with Patient
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
## 2019-04-27 NOTE — Patient Instructions (Signed)

## 2019-04-27 NOTE — H&P (View-Only) (Signed)
Heather Mckenzie Documented: 04/27/2019 7:36 AM Location: [facility] Patient #: [id number] DOB: 16-Jun-1980 Undefined / Language: Heather Mckenzie / Race: White Female  History of Present Illness Heather Moores A. Damean Poffenberger MD; 04/27/2019 3:10 PM) Patient words: Pt sent at the request of Dr Radford Pax for left breast mass x 3 months. Pt noted mass in left breast last september. It has grown since then. No pain, discharge or skin dimpling. 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                    ADDITIONAL INFORMATION: 1. PROGNOSTIC INDICATORS Results: IMMUNOHISTOCHEMICAL AND MORPHOMETRIC ANALYSIS PERFORMED MANUALLY The tumor cells are NEGATIVE for Her2 (1+). Estrogen Receptor: 0%, NEGATIVE Progesterone Receptor: 0%, NEGATIVE Proliferation Marker Ki67: 90% COMMENT: The negative hormone receptor study(ies) in this case has AN internal positive control. REFERENCE RANGE ESTROGEN RECEPTOR NEGATIVE 0% POSITIVE =>1% REFERENCE RANGE PROGESTERONE RECEPTOR NEGATIVE 0% POSITIVE =>1% All controls stained appropriately Vicente Males MD Pathologist, Electronic Signature ( Signed 04/25/2019) 3. PROGNOSTIC INDICATORS Results: IMMUNOHISTOCHEMICAL AND MORPHOMETRIC ANALYSIS PERFORMED MANUALLY The tumor cells are NEGATIVE for Her2 (1+). 1 of 3 FINAL for Heather Mckenzie, Heather Mckenzie ([id number]) ADDITIONAL INFORMATION:(continued) Estrogen Receptor: 0%, NEGATIVE Progesterone Receptor: 0%, NEGATIVE Proliferation Marker Ki67: 80% COMMENT: The negative hormone receptor study(ies) in this case has NO internal positive control. REFERENCE RANGE ESTROGEN RECEPTOR NEGATIVE 0% POSITIVE =>1% REFERENCE RANGE PROGESTERONE RECEPTOR NEGATIVE 0% POSITIVE =>1% All controls stained appropriately Vicente Males MD Pathologist, Electronic Signature ( Signed 04/25/2019) FINAL DIAGNOSIS Diagnosis 1. Breast, left, needle core  biopsy, 10 o'clock, 7cmfn - INVASIVE DUCTAL CARCINOMA - SEE COMMENT 2. Breast, left, needle core biopsy, 1 o'clock, 6cmfn - INVASIVE DUCTAL CARCINOMA - SEE COMMENT 3. Lymph node, needle/core biopsy, left axilla - METASTATIC CARCINOMA INVOLVING A LYMPH NODE - SEE COMMENT Microscopic Comment 1. and 2. The morphology and parts 1 and 2 are similar. Based on the biopsy, the carcinoma appears Nottingham grade 3 of 3 and measures 1.0 cm in greatest linear extent. Prognostic markers (ER/PR/ki-67/HER2) are pending and will be reported in an addendum. Dr. Jeannie Done reviewed the case and agrees with the above diagnosis. These results were called to [facility] on April 22, 2019. 3. Prognostic (ER, PR , ki-67 and HER-2) markers are pending and will be reported in an addendum Thressa Sheller MD Pathologist, Electronic Signature (Case signed 04/22/2019) Specimen Gross and Clinical Information 39 year old patient had 3 ultrasound-guided core needle biopsies performed today.  EXAM: DIAGNOSTIC LEFT MAMMOGRAM POST ULTRASOUND BIOPSIES  COMPARISON: Previous exam(s).  FINDINGS: Mammographic images were obtained following ULTRASOUND guided biopsy of 2 LEFT BREAST MASSES AND A LEFT AXILLARY LYMPH NODE.  The ribbon shaped biopsy clip is satisfactorily positioned in the biopsied upper inner quadrant left breast mass, 10 o'clock position.  Coil shaped biopsy clip is satisfactorily positioned in the upper outer quadrant 1 o'clock position biopsied mass.  Q shaped biopsy clip satisfactorily positioned within the biopsied lymph node.  IMPRESSION: Appropriate positioning of 3 marking clips at the site of 2 breast biopsies and a left axillary lymph node biopsy.  Final Assessment: Post Procedure Mammograms for Marker Placement   Electronically Signed By: Curlene Dolphin M.D. On: 04/22/2019 54:18  39 year old patient was recently evaluated at [facility] with bilateral  mammography and left breast ultrasound. She presents today for left breast and axillary lymph node biopsies. An ultrasound is being performed to  document the left breast masses and number of suspicious axillary lymph nodes, prior to today's biopsies.  The patient has noticed a mass approximately 3 months ago in the upper outer left breast, 1 o'clock axis. Since she first noticed it, it feels to her like it has changed in configuration, as if it has broken into 2 adjacent masses. She has also palpated a lump in the left axilla that is sometimes tender.  EXAM: ULTRASOUND OF THE LEFT BREAST  COMPARISON: Outside imaging from [facility].  FINDINGS: On physical exam, I palpate bulky lymphadenopathy in the inferior left axilla.  There is a dominant mass in the 1 o'clock axis of the left breast approximately 6 cm from the nipple, with smaller palpable masses extending in a segmental fashion toward the nipple in the 1 o'clock axis. There is also a palpable lump in the upper inner quadrant of the left breast at 10 o'clock position approximately 7 cm from the nipple. The skin of the left breast and the nipple areolar complex appear normal.  Targeted ultrasound is performed, showing multiple masses in the left breast as described below:  -At 1 o'clock position 6 cm from nipple is a dominant palpable irregular mixed echogenicity mass with central hypoechogenicity and peripheral increased echogenicity and some internal Mckenzie flow. This mass measures 3.6 x 2.4 x 2.0 cm.  -At 1 o'clock position 5 cm from the nipple is a similar appearing heterogeneous but more superficially positioned mass measuring 1.5 x 1.0 x 0.9 cm.  -At 1 o'clock position 3 cm from the nipple is a heterogeneously hypoechoic mass or conglomeration of masses with peripheral echogenicity. This area measures approximately 4.0 x 1.9 x 3.0 cm.  -At 1 o'clock position 2 cm from the nipple is a  palpable heterogeneously hypoechoic mass measuring 1.0 x 1.0 x 0.8 cm.  -At 1:30 position is a mixed hypoechoic and hyperechoic irregular mass measuring 1.1 x 0.8 x 0.9 cm.  -At 10 o'clock position 7 cm from nipple is a heterogeneously hypoechoic mass measuring 1.6 x 1.3 x 0.9 cm.  -At 11 o'clock position 8 cm from nipple is an irregular heterogeneously hypoechoic and hyperechoic mass measuring 1.4 x 0.6 x 0.6 cm.  Ultrasound of the left axilla shows multiple bulky lymph nodes with hypoechoic and diffusely thickened cortices. Five discrete abnormal axillary lymph nodes are identified in the left axilla. Largest axillary lymph node measures 4.2 x 1.6 x 2.0 cm. At least half of the cortex is markedly thickened, measuring up to 1.3 cm. Portions of the cortex remain thin and there is some fatty hilum. The second largest lymph node measures 1.8 x 2.6 x 2.8 cm and has no visible fatty hilum. Three additional smaller but abnormal lymph nodes with hypoechoic diffusely thickened cortices are identified.  IMPRESSION: Findings suspicious for multifocal and multicentric malignancy in the left breast involving the upper outer and upper inner quadrants. Five pathologic lymph nodes are identified in the left axilla.  RECOMMENDATION: Ultrasound-guided biopsies (a total of 3) of the dominant palpable mass in the 1 o'clock axis of the left breast 6 cm from the nipple, of the mass at 10 o'clock position 7 cm from the nipple, and of a suspicious axillary lymph node is recommended and will be performed today and dictated separately. Today's findings were discussed in detail with the patient.  I have discussed the findings and recommendations with the patient. If applicable, a reminder letter will be sent to the patient regarding the next appointment.  BI-RADS CATEGORY 5: Highly suggestive of malignancy.   Electronically Signed By: Curlene Dolphin M.D. On: 04/21/2019 16:56.  The patient is  a 39 year old female.   Past Surgical History Tawni Pummel, RN; 04/27/2019 7:36 AM) Oral Surgery  Diagnostic Studies History Tawni Pummel, RN; 04/27/2019 7:36 AM) Colonoscopy never Mammogram within last year Pap Smear 1-5 years ago  Medication History Tawni Pummel, RN; 04/27/2019 7:37 AM) Medications Reconciled  Social History Tawni Pummel, RN; 04/27/2019 7:36 AM) Caffeine use Tea. No alcohol use No drug use Tobacco use Never smoker.  Family History Tawni Pummel, RN; 04/27/2019 7:36 AM) Kidney Disease Father.  Pregnancy / Birth History Tawni Pummel, RN; 04/27/2019 7:36 AM) Age at menarche 58 years. Contraceptive History Intrauterine device, Oral contraceptives. Gravida 2 Maternal age 48-30 Para 2 Regular periods  Other Problems Tawni Pummel, RN; 04/27/2019 7:36 AM) Lump In Breast     Review of Systems Tawni Pummel RN; 04/27/2019 7:36 AM) General Not Present- Appetite Loss, Chills, Fatigue, Fever, Night Sweats, Weight Gain and Weight Loss. Skin Not Present- Change in Wart/Mole, Dryness, Hives, Jaundice, New Lesions, Non-Healing Wounds, Rash and Ulcer. HEENT Present- Seasonal Allergies and Wears glasses/contact lenses. Not Present- Earache, Hearing Loss, Hoarseness, Nose Bleed, Oral Ulcers, Ringing in the Ears, Sinus Pain, Sore Throat, Visual Disturbances and Yellow Eyes. Respiratory Not Present- Bloody sputum, Chronic Cough, Difficulty Breathing, Snoring and Wheezing. Breast Present- Breast Mass. Not Present- Breast Pain, Nipple Discharge and Skin Changes. Cardiovascular Not Present- Chest Pain, Difficulty Breathing Lying Down, Leg Cramps, Palpitations, Rapid Heart Rate, Shortness of Breath and Swelling of Extremities. Gastrointestinal Not Present- Abdominal Pain, Bloating, Bloody Stool, Change in Bowel Habits, Chronic diarrhea, Constipation, Difficulty Swallowing, Excessive gas, Gets full quickly at meals, Hemorrhoids, Indigestion, Nausea,  Rectal Pain and Vomiting. Female Genitourinary Not Present- Frequency, Nocturia, Painful Urination, Pelvic Pain and Urgency. Musculoskeletal Not Present- Back Pain, Joint Pain, Joint Stiffness, Muscle Pain, Muscle Weakness and Swelling of Extremities. Neurological Not Present- Decreased Memory, Fainting, Headaches, Numbness, Seizures, Tingling, Tremor, Trouble walking and Weakness. Psychiatric Not Present- Anxiety, Bipolar, Change in Sleep Pattern, Depression, Fearful and Frequent crying. Endocrine Not Present- Cold Intolerance, Excessive Hunger, Hair Changes, Heat Intolerance, Hot flashes and New Diabetes. Hematology Not Present- Mckenzie Thinners, Easy Bruising, Excessive bleeding, Gland problems, HIV and Persistent Infections.   Physical Exam (Brezlyn Manrique A. Daija Routson MD; 04/27/2019 3:12 PM)  General Mental Status-Alert. General Appearance-Consistent with stated age. Hydration-Well hydrated. Voice-Normal.  Chest and Lung Exam Note: WOB normal no distress  Breast Note: MULTIPLE LEFT BREAST MASSES AT 10 OCLOCK AND 1 OCLOCK 3.6 CM AT 1 AND 1.6 CM AT 10  BOTH MOBILE LARGE AMOUNT OF ptosis    right normal  Cardiovascular Note: NSR  Neurologic Neurologic evaluation reveals -alert and oriented x 3 with no impairment of recent or remote memory. Mental Status-Normal.  Musculoskeletal Normal Exam - Left-Upper Extremity Strength Normal and Lower Extremity Strength Normal. Normal Exam - Right-Upper Extremity Strength Normal and Lower Extremity Strength Normal.  Lymphatic Head & Neck  General Head & Neck Lymphatics: Bilateral - Description - Normal. Axillary -Note:multiple enlarged left axillary LN matted but mobile.     Assessment & Plan (Kourtnei Rauber A. Lacinda Curvin MD; 04/27/2019 3:15 PM)  BREAST CANCER, LEFT (C50.912) Impression: multifocal may need MRM in the future unless she has complete response which gives a small shot at conservation MRI chemo radiation to  follow needs port Pt requires port placement for chemotherapy. Risk include bleeding, infection, pneumothorax, hemothorax, mediastinal injury, nerve injury , Mckenzie  vessel injury, stroke, Mckenzie clots, death, migration. embolization and need for additional procedures. Pt agrees to proceed.    45 min total time  Current Plans Pt Education - [facility] Portacath HCI Use of a central venous catheter for intravenous therapy was discussed. Technique of catheter placement using ultrasound and fluoroscopy guidance was discussed. Risks such as bleeding, infection, pneumothorax, catheter occlusion, reoperation, and other risks were discussed. I noted a good likelihood this will help address the problem. Questions were answered. The patient expressed understanding & wishes to proceed. You are being scheduled for surgery- Our schedulers will call you.  You should hear from our office's scheduling department within 5 working days about the location, date, and time of surgery. We try to make accommodations for patient's preferences in scheduling surgery, but sometimes the OR schedule or the surgeon's schedule prevents [place] from making those accommodations.  If you have not heard from our office [phone] in 5 working days, call the office and ask for your surgeon's nurse.  If you have other questions about your diagnosis, plan, or surgery, call the office and ask for your surgeon's nurse.  Pt Education - [facility] Breast Cancer Information Given - [employer] "Breast Journey" Package

## 2019-04-29 ENCOUNTER — Telehealth: Payer: Self-pay | Admitting: *Deleted

## 2019-04-29 ENCOUNTER — Other Ambulatory Visit: Payer: Self-pay | Admitting: *Deleted

## 2019-04-29 DIAGNOSIS — C50812 Malignant neoplasm of overlapping sites of left female breast: Secondary | ICD-10-CM

## 2019-04-29 DIAGNOSIS — Z171 Estrogen receptor negative status [ER-]: Secondary | ICD-10-CM

## 2019-04-29 NOTE — Telephone Encounter (Signed)
Attempt x1 to contact pt to verify current insurance information.  No answer, unable to LVM due to VM box being full. Dawn, RN navigator notified.

## 2019-04-29 NOTE — Telephone Encounter (Signed)
Called pt to discuss [place] from 1.13.21. Unable to leave vm d/t mailbox is full. Referral placed for pt to see Dr. Iran Planas for future plastic surgery options after chemotherapy.

## 2019-05-02 ENCOUNTER — Ambulatory Visit (HOSPITAL_COMMUNITY): Payer: Self-pay

## 2019-05-02 ENCOUNTER — Telehealth: Payer: Self-pay | Admitting: *Deleted

## 2019-05-02 NOTE — Telephone Encounter (Signed)
Left vm for pt to return call regarding new insurance information in order to r/s staging scans prior to chemo. Contact information provided.

## 2019-05-03 ENCOUNTER — Encounter (HOSPITAL_BASED_OUTPATIENT_CLINIC_OR_DEPARTMENT_OTHER): Payer: Self-pay | Admitting: Surgery

## 2019-05-03 ENCOUNTER — Encounter: Payer: Self-pay | Admitting: *Deleted

## 2019-05-03 ENCOUNTER — Ambulatory Visit (HOSPITAL_COMMUNITY): Payer: Self-pay

## 2019-05-03 ENCOUNTER — Other Ambulatory Visit: Payer: Self-pay

## 2019-05-03 ENCOUNTER — Telehealth: Payer: Self-pay | Admitting: Hematology and Oncology

## 2019-05-03 ENCOUNTER — Inpatient Hospital Stay (HOSPITAL_COMMUNITY): Admission: RE | Admit: 2019-05-03 | Payer: Self-pay | Source: Ambulatory Visit

## 2019-05-03 NOTE — Telephone Encounter (Signed)
Scheduled appt per 1/18 sch message - pt is aware appts added.

## 2019-05-04 ENCOUNTER — Encounter: Payer: Self-pay | Admitting: General Practice

## 2019-05-04 ENCOUNTER — Encounter: Payer: Self-pay | Admitting: *Deleted

## 2019-05-04 ENCOUNTER — Telehealth: Payer: Self-pay | Admitting: *Deleted

## 2019-05-04 ENCOUNTER — Other Ambulatory Visit: Payer: Self-pay

## 2019-05-04 ENCOUNTER — Encounter (HOSPITAL_BASED_OUTPATIENT_CLINIC_OR_DEPARTMENT_OTHER): Payer: Self-pay | Admitting: Surgery

## 2019-05-04 DIAGNOSIS — C801 Malignant (primary) neoplasm, unspecified: Secondary | ICD-10-CM

## 2019-05-04 HISTORY — DX: Malignant (primary) neoplasm, unspecified: C80.1

## 2019-05-04 NOTE — Telephone Encounter (Signed)
Spoke to pt concerning appts. Confirmed all appts.

## 2019-05-04 NOTE — Progress Notes (Signed)
[place] Psychosocial Distress Screening Spiritual Care  Met with Heather Mckenzie by phone following [facility] to introduce [place] team/resources, reviewing distress screen per protocol.  The patient scored a 0 on the Psychosocial Distress Thermometer which indicates mild distress. Also assessed for distress and other psychosocial needs.   ONCBCN DISTRESS SCREENING 05/04/2019  Screening Type Initial Screening  Distress experienced in past week (1-10) 0  Referral to support programs Yes   Heather Mckenzie reports "a great support system" and a strong faith keeping her lifted up. She notes that she and her husband are talking frankly with their children, 6 and 8, to reassure them and help them understand what to expect in advance.  Follow up needed: No. Heather Mckenzie is aware of ongoing [facility] team/programming and prefers to call as needed/desired.   [place], [place], [facility] Pager [phone] Voicemail [phone]

## 2019-05-05 ENCOUNTER — Telehealth: Payer: Self-pay | Admitting: Licensed Clinical Social Worker

## 2019-05-05 NOTE — Telephone Encounter (Signed)
Called patient to let her know that the genetic testing lab needs her updated insurance information. She will send a copy of this to me via email.

## 2019-05-06 ENCOUNTER — Other Ambulatory Visit (HOSPITAL_COMMUNITY): Payer: Self-pay

## 2019-05-06 ENCOUNTER — Ambulatory Visit (HOSPITAL_COMMUNITY): Payer: Self-pay

## 2019-05-07 ENCOUNTER — Other Ambulatory Visit (HOSPITAL_COMMUNITY): Payer: Self-pay

## 2019-05-09 ENCOUNTER — Encounter (HOSPITAL_COMMUNITY)
Admission: RE | Admit: 2019-05-09 | Discharge: 2019-05-09 | Disposition: A | Discharge status: Home / Self Care | Payer: 59 | Source: Ambulatory Visit | Attending: Hematology and Oncology | Admitting: Hematology and Oncology

## 2019-05-09 ENCOUNTER — Telehealth: Payer: Self-pay | Admitting: *Deleted

## 2019-05-09 ENCOUNTER — Ambulatory Visit (HOSPITAL_COMMUNITY)
Admission: RE | Admit: 2019-05-09 | Discharge: 2019-05-09 | Disposition: A | Discharge status: Home / Self Care | Payer: 59 | Source: Ambulatory Visit | Attending: Hematology and Oncology | Admitting: Hematology and Oncology

## 2019-05-09 ENCOUNTER — Other Ambulatory Visit: Payer: Self-pay

## 2019-05-09 ENCOUNTER — Other Ambulatory Visit (HOSPITAL_COMMUNITY): Payer: Self-pay

## 2019-05-09 ENCOUNTER — Ambulatory Visit (HOSPITAL_BASED_OUTPATIENT_CLINIC_OR_DEPARTMENT_OTHER)
Admission: RE | Admit: 2019-05-09 | Discharge: 2019-05-09 | Disposition: A | Discharge status: Home / Self Care | Payer: 59 | Source: Ambulatory Visit | Attending: Hematology and Oncology | Admitting: Hematology and Oncology

## 2019-05-09 ENCOUNTER — Inpatient Hospital Stay: Payer: 59

## 2019-05-09 ENCOUNTER — Other Ambulatory Visit (HOSPITAL_COMMUNITY)
Admission: RE | Admit: 2019-05-09 | Discharge: 2019-05-09 | Disposition: A | Discharge status: Home / Self Care | Payer: 59 | Source: Ambulatory Visit

## 2019-05-09 ENCOUNTER — Other Ambulatory Visit: Payer: Self-pay | Admitting: Hematology and Oncology

## 2019-05-09 DIAGNOSIS — Z01818 Encounter for other preprocedural examination: Secondary | ICD-10-CM | POA: Insufficient documentation

## 2019-05-09 DIAGNOSIS — Z20822 Contact with and (suspected) exposure to covid-19: Secondary | ICD-10-CM | POA: Diagnosis not present

## 2019-05-09 DIAGNOSIS — Z171 Estrogen receptor negative status [ER-]: Secondary | ICD-10-CM

## 2019-05-09 DIAGNOSIS — C50812 Malignant neoplasm of overlapping sites of left female breast: Secondary | ICD-10-CM | POA: Insufficient documentation

## 2019-05-09 LAB — SARS CORONAVIRUS 2 (TAT 6-24 HRS): SARS Coronavirus 2: NEGATIVE

## 2019-05-09 MED ORDER — LORAZEPAM 0.5 MG PO TABS: 0.5000 mg | ORAL_TABLET | Freq: Every evening | ORAL | 0 refills | Status: DC | PRN

## 2019-05-09 MED ORDER — TECHNETIUM TC 99M MEDRONATE IV KIT
21.1000 | PACK | Freq: Once | INTRAVENOUS | Status: AC | PRN
  Administered 2019-05-09: 21.1 via INTRAVENOUS

## 2019-05-09 MED ORDER — GADOBUTROL 1 MMOL/ML IV SOLN: 20.0000 mL | Freq: Once | INTRAVENOUS | Status: DC | PRN

## 2019-05-09 MED ORDER — LIDOCAINE-PRILOCAINE 2.5-2.5 % EX CREA: TOPICAL_CREAM | CUTANEOUS | 3 refills | Status: DC

## 2019-05-09 MED ORDER — PROCHLORPERAZINE MALEATE 10 MG PO TABS: 10.0000 mg | ORAL_TABLET | Freq: Four times a day (QID) | ORAL | 1 refills | Status: DC | PRN

## 2019-05-09 MED ORDER — ONDANSETRON HCL 8 MG PO TABS: 8.0000 mg | ORAL_TABLET | Freq: Two times a day (BID) | ORAL | 1 refills | Status: DC | PRN

## 2019-05-09 NOTE — Progress Notes (Signed)
Attempted patient for MRI breast, patient was too claustro, patient needs to be scanned at [facility].   Thank you!

## 2019-05-09 NOTE — Progress Notes (Signed)

## 2019-05-09 NOTE — Progress Notes (Signed)
  Echocardiogram 2D Echocardiogram has been performed.  Heather Mckenzie 05/09/2019, 10:01 AM

## 2019-05-09 NOTE — Telephone Encounter (Signed)
Received msg from pt that could not proceed with breast MRI at [facility] and would like to move it to GI. Msg sent to r/s her MRI to GI. Pt also request CT scans to be moved up to earlier in the day to family conflicts. R/s CT scan to 1/26 at 7:30 arrive at 7:15. VM and secure email sent to pt. Contact information provided for questions or need.

## 2019-05-10 ENCOUNTER — Other Ambulatory Visit: Payer: Self-pay | Admitting: *Deleted

## 2019-05-10 ENCOUNTER — Ambulatory Visit (HOSPITAL_COMMUNITY): Payer: 59

## 2019-05-10 ENCOUNTER — Ambulatory Visit (HOSPITAL_COMMUNITY)
Admission: RE | Admit: 2019-05-10 | Discharge: 2019-05-10 | Disposition: A | Discharge status: Home / Self Care | Payer: 59 | Source: Ambulatory Visit | Attending: Hematology and Oncology | Admitting: Hematology and Oncology

## 2019-05-10 ENCOUNTER — Encounter: Payer: Self-pay | Admitting: *Deleted

## 2019-05-10 DIAGNOSIS — Z171 Estrogen receptor negative status [ER-]: Secondary | ICD-10-CM | POA: Insufficient documentation

## 2019-05-10 DIAGNOSIS — C50812 Malignant neoplasm of overlapping sites of left female breast: Secondary | ICD-10-CM | POA: Insufficient documentation

## 2019-05-10 MED ORDER — SODIUM CHLORIDE (PF) 0.9 % IJ SOLN
INTRAMUSCULAR | Status: AC
  Filled 2019-05-10: qty 50

## 2019-05-10 MED ORDER — IOHEXOL 300 MG/ML  SOLN
100.0000 mL | Freq: Once | INTRAMUSCULAR | Status: AC | PRN
  Administered 2019-05-10: 100 mL via INTRAVENOUS

## 2019-05-11 ENCOUNTER — Encounter (HOSPITAL_BASED_OUTPATIENT_CLINIC_OR_DEPARTMENT_OTHER): Payer: Self-pay | Admitting: Surgery

## 2019-05-11 ENCOUNTER — Ambulatory Visit (HOSPITAL_COMMUNITY): Payer: 59

## 2019-05-11 ENCOUNTER — Other Ambulatory Visit: Payer: Self-pay

## 2019-05-11 ENCOUNTER — Encounter: Payer: Self-pay | Admitting: *Deleted

## 2019-05-11 ENCOUNTER — Telehealth: Payer: Self-pay | Admitting: Licensed Clinical Social Worker

## 2019-05-11 ENCOUNTER — Ambulatory Visit (HOSPITAL_BASED_OUTPATIENT_CLINIC_OR_DEPARTMENT_OTHER): Payer: 59 | Admitting: Anesthesiology

## 2019-05-11 ENCOUNTER — Encounter (HOSPITAL_BASED_OUTPATIENT_CLINIC_OR_DEPARTMENT_OTHER)
Admission: RE | Disposition: A | Discharge status: Home / Self Care | Payer: Self-pay | Source: Home / Self Care | Attending: Surgery

## 2019-05-11 ENCOUNTER — Ambulatory Visit (HOSPITAL_BASED_OUTPATIENT_CLINIC_OR_DEPARTMENT_OTHER)
Admission: RE | Admit: 2019-05-11 | Discharge: 2019-05-11 | Disposition: A | Discharge status: Home / Self Care | Payer: 59 | Attending: Surgery | Admitting: Surgery

## 2019-05-11 ENCOUNTER — Ambulatory Visit: Payer: Self-pay | Admitting: Licensed Clinical Social Worker

## 2019-05-11 ENCOUNTER — Encounter: Payer: Self-pay | Admitting: Licensed Clinical Social Worker

## 2019-05-11 DIAGNOSIS — C50912 Malignant neoplasm of unspecified site of left female breast: Secondary | ICD-10-CM | POA: Diagnosis present

## 2019-05-11 DIAGNOSIS — C773 Secondary and unspecified malignant neoplasm of axilla and upper limb lymph nodes: Secondary | ICD-10-CM | POA: Diagnosis not present

## 2019-05-11 DIAGNOSIS — Z8042 Family history of malignant neoplasm of prostate: Secondary | ICD-10-CM

## 2019-05-11 DIAGNOSIS — Z419 Encounter for procedure for purposes other than remedying health state, unspecified: Secondary | ICD-10-CM

## 2019-05-11 DIAGNOSIS — Z1379 Encounter for other screening for genetic and chromosomal anomalies: Secondary | ICD-10-CM | POA: Insufficient documentation

## 2019-05-11 DIAGNOSIS — Z800 Family history of malignant neoplasm of digestive organs: Secondary | ICD-10-CM

## 2019-05-11 DIAGNOSIS — Z95828 Presence of other vascular implants and grafts: Secondary | ICD-10-CM

## 2019-05-11 DIAGNOSIS — C50812 Malignant neoplasm of overlapping sites of left female breast: Secondary | ICD-10-CM

## 2019-05-11 DIAGNOSIS — Z171 Estrogen receptor negative status [ER-]: Secondary | ICD-10-CM

## 2019-05-11 HISTORY — PX: PORTACATH PLACEMENT: SHX2246

## 2019-05-11 LAB — POCT PREGNANCY, URINE: Preg Test, Ur: NEGATIVE

## 2019-05-11 SURGERY — INSERTION, TUNNELED CENTRAL VENOUS DEVICE, WITH PORT
Anesthesia: General | Site: Chest

## 2019-05-11 MED ORDER — GABAPENTIN 300 MG PO CAPS
300.0000 mg | ORAL_CAPSULE | ORAL | Status: AC
  Administered 2019-05-11: 300 mg via ORAL

## 2019-05-11 MED ORDER — CEFAZOLIN SODIUM-DEXTROSE 2-4 GM/100ML-% IV SOLN
INTRAVENOUS | Status: AC
  Filled 2019-05-11: qty 100

## 2019-05-11 MED ORDER — CEFAZOLIN SODIUM-DEXTROSE 2-4 GM/100ML-% IV SOLN: 2.0000 g | INTRAVENOUS | Status: DC

## 2019-05-11 MED ORDER — FENTANYL CITRATE (PF) 100 MCG/2ML IJ SOLN
50.0000 ug | INTRAMUSCULAR | Status: DC | PRN
  Administered 2019-05-11: 16:00:00 100 ug via INTRAVENOUS

## 2019-05-11 MED ORDER — CELECOXIB 200 MG PO CAPS
ORAL_CAPSULE | ORAL | Status: AC
  Filled 2019-05-11: qty 1

## 2019-05-11 MED ORDER — OXYCODONE HCL 5 MG/5ML PO SOLN: 5.0000 mg | Freq: Once | ORAL | Status: DC | PRN

## 2019-05-11 MED ORDER — ACETAMINOPHEN 500 MG PO TABS
1000.0000 mg | ORAL_TABLET | ORAL | Status: AC
  Administered 2019-05-11: 1000 mg via ORAL

## 2019-05-11 MED ORDER — CHLORHEXIDINE GLUCONATE CLOTH 2 % EX PADS: 6.0000 | MEDICATED_PAD | Freq: Once | CUTANEOUS | Status: DC

## 2019-05-11 MED ORDER — BUPIVACAINE HCL (PF) 0.25 % IJ SOLN
INTRAMUSCULAR | Status: DC | PRN
  Administered 2019-05-11: 19 mL

## 2019-05-11 MED ORDER — FENTANYL CITRATE (PF) 100 MCG/2ML IJ SOLN
INTRAMUSCULAR | Status: AC
  Filled 2019-05-11: qty 2

## 2019-05-11 MED ORDER — OXYCODONE HCL 5 MG PO TABS: 5.0000 mg | ORAL_TABLET | Freq: Four times a day (QID) | ORAL | 0 refills | Status: DC | PRN

## 2019-05-11 MED ORDER — MIDAZOLAM HCL 2 MG/2ML IJ SOLN
1.0000 mg | INTRAMUSCULAR | Status: DC | PRN
  Administered 2019-05-11: 16:00:00 2 mg via INTRAVENOUS

## 2019-05-11 MED ORDER — LIDOCAINE 2% (20 MG/ML) 5 ML SYRINGE
INTRAMUSCULAR | Status: AC
  Filled 2019-05-11: qty 5

## 2019-05-11 MED ORDER — ONDANSETRON HCL 4 MG/2ML IJ SOLN
INTRAMUSCULAR | Status: DC | PRN
  Administered 2019-05-11: 4 mg via INTRAVENOUS

## 2019-05-11 MED ORDER — LACTATED RINGERS IV SOLN: INTRAVENOUS | Status: DC

## 2019-05-11 MED ORDER — PROPOFOL 10 MG/ML IV BOLUS
INTRAVENOUS | Status: DC | PRN
  Administered 2019-05-11: 150 mg via INTRAVENOUS

## 2019-05-11 MED ORDER — GABAPENTIN 300 MG PO CAPS
ORAL_CAPSULE | ORAL | Status: AC
  Filled 2019-05-11: qty 1

## 2019-05-11 MED ORDER — HEPARIN (PORCINE) IN NACL 2-0.9 UNITS/ML
INTRAMUSCULAR | Status: AC | PRN
  Administered 2019-05-11: 1 via INTRAVENOUS

## 2019-05-11 MED ORDER — DEXAMETHASONE SODIUM PHOSPHATE 10 MG/ML IJ SOLN
INTRAMUSCULAR | Status: AC
  Filled 2019-05-11: qty 1

## 2019-05-11 MED ORDER — PROMETHAZINE HCL 25 MG/ML IJ SOLN: 6.2500 mg | INTRAMUSCULAR | Status: DC | PRN

## 2019-05-11 MED ORDER — EPHEDRINE 5 MG/ML INJ
INTRAVENOUS | Status: AC
  Filled 2019-05-11: qty 10

## 2019-05-11 MED ORDER — MIDAZOLAM HCL 2 MG/2ML IJ SOLN
INTRAMUSCULAR | Status: AC
  Filled 2019-05-11: qty 2

## 2019-05-11 MED ORDER — OXYCODONE HCL 5 MG PO TABS: 5.0000 mg | ORAL_TABLET | Freq: Once | ORAL | Status: DC | PRN

## 2019-05-11 MED ORDER — PROPOFOL 500 MG/50ML IV EMUL
INTRAVENOUS | Status: AC
  Filled 2019-05-11: qty 50

## 2019-05-11 MED ORDER — CELECOXIB 200 MG PO CAPS
200.0000 mg | ORAL_CAPSULE | ORAL | Status: AC
  Administered 2019-05-11: 200 mg via ORAL

## 2019-05-11 MED ORDER — PHENYLEPHRINE 40 MCG/ML (10ML) SYRINGE FOR IV PUSH (FOR BLOOD PRESSURE SUPPORT)
PREFILLED_SYRINGE | INTRAVENOUS | Status: AC
  Filled 2019-05-11: qty 10

## 2019-05-11 MED ORDER — ACETAMINOPHEN 500 MG PO TABS
ORAL_TABLET | ORAL | Status: AC
  Filled 2019-05-11: qty 2

## 2019-05-11 MED ORDER — DEXAMETHASONE SODIUM PHOSPHATE 4 MG/ML IJ SOLN
INTRAMUSCULAR | Status: DC | PRN
  Administered 2019-05-11: 5 mg via INTRAVENOUS

## 2019-05-11 MED ORDER — FENTANYL CITRATE (PF) 100 MCG/2ML IJ SOLN: 25.0000 ug | INTRAMUSCULAR | Status: DC | PRN

## 2019-05-11 MED ORDER — HEPARIN SOD (PORK) LOCK FLUSH 100 UNIT/ML IV SOLN
INTRAVENOUS | Status: AC
  Filled 2019-05-11: qty 5

## 2019-05-11 MED ORDER — HEPARIN SOD (PORK) LOCK FLUSH 100 UNIT/ML IV SOLN
INTRAVENOUS | Status: DC | PRN
  Administered 2019-05-11: 400 [IU] via INTRAVENOUS

## 2019-05-11 MED ORDER — HEPARIN (PORCINE) IN NACL 1000-0.9 UT/500ML-% IV SOLN
INTRAVENOUS | Status: AC
  Filled 2019-05-11: qty 500

## 2019-05-11 MED ORDER — BUPIVACAINE HCL (PF) 0.25 % IJ SOLN
INTRAMUSCULAR | Status: AC
  Filled 2019-05-11: qty 30

## 2019-05-11 MED ORDER — ONDANSETRON HCL 4 MG/2ML IJ SOLN
INTRAMUSCULAR | Status: AC
  Filled 2019-05-11: qty 2

## 2019-05-11 MED ORDER — SUCCINYLCHOLINE CHLORIDE 200 MG/10ML IV SOSY
PREFILLED_SYRINGE | INTRAVENOUS | Status: AC
  Filled 2019-05-11: qty 10

## 2019-05-11 SURGICAL SUPPLY — 54 items
BAG DECANTER FOR FLEXI CONT (MISCELLANEOUS) ×2 IMPLANT
BENZOIN TINCTURE PRP APPL 2/3 (GAUZE/BANDAGES/DRESSINGS) IMPLANT
BLADE HEX COATED 2.75 (ELECTRODE) ×2 IMPLANT
BLADE SURG 11 STRL SS (BLADE) ×2 IMPLANT
BLADE SURG 15 STRL LF DISP TIS (BLADE) ×1 IMPLANT
BLADE SURG 15 STRL SS (BLADE) ×1
CANISTER SUCT 1200ML W/VALVE (MISCELLANEOUS) IMPLANT
CHLORAPREP W/TINT 26 (MISCELLANEOUS) ×2 IMPLANT
COVER BACK TABLE 60X90IN (DRAPES) ×2 IMPLANT
COVER MAYO STAND STRL (DRAPES) ×2 IMPLANT
COVER PROBE 5X48 (MISCELLANEOUS) ×1
COVER WAND RF STERILE (DRAPES) IMPLANT
DECANTER SPIKE VIAL GLASS SM (MISCELLANEOUS) IMPLANT
DERMABOND ADVANCED (GAUZE/BANDAGES/DRESSINGS) ×1
DERMABOND ADVANCED .7 DNX12 (GAUZE/BANDAGES/DRESSINGS) ×1 IMPLANT
DRAPE C-ARM 42X72 X-RAY (DRAPES) ×2 IMPLANT
DRAPE LAPAROSCOPIC ABDOMINAL (DRAPES) ×2 IMPLANT
DRAPE UTILITY XL STRL (DRAPES) ×2 IMPLANT
DRSG TEGADERM 2-3/8X2-3/4 SM (GAUZE/BANDAGES/DRESSINGS) IMPLANT
DRSG TEGADERM 4X4.75 (GAUZE/BANDAGES/DRESSINGS) IMPLANT
ELECT REM PT RETURN 9FT ADLT (ELECTROSURGICAL) ×2
ELECTRODE REM PT RTRN 9FT ADLT (ELECTROSURGICAL) ×1 IMPLANT
GAUZE SPONGE 4X4 12PLY STRL LF (GAUZE/BANDAGES/DRESSINGS) IMPLANT
GLOVE BIOGEL PI IND STRL 8 (GLOVE) ×1 IMPLANT
GLOVE BIOGEL PI INDICATOR 8 (GLOVE) ×1
GLOVE ECLIPSE 8.0 STRL XLNG CF (GLOVE) ×2 IMPLANT
GOWN STRL REUS W/ TWL LRG LVL3 (GOWN DISPOSABLE) ×2 IMPLANT
GOWN STRL REUS W/TWL LRG LVL3 (GOWN DISPOSABLE) ×2
IV KIT MINILOC 20X1 SAFETY (NEEDLE) IMPLANT
KIT CVR 48X5XPRB PLUP LF (MISCELLANEOUS) ×1 IMPLANT
KIT PORT POWER 8FR ISP CVUE (Port) ×2 IMPLANT
NDL SAFETY ECLIPSE 18X1.5 (NEEDLE) IMPLANT
NEEDLE HYPO 18GX1.5 SHARP (NEEDLE)
NEEDLE HYPO 22GX1.5 SAFETY (NEEDLE) IMPLANT
NEEDLE HYPO 25X1 1.5 SAFETY (NEEDLE) ×2 IMPLANT
NEEDLE SPNL 22GX3.5 QUINCKE BK (NEEDLE) IMPLANT
PACK BASIN DAY SURGERY FS (CUSTOM PROCEDURE TRAY) ×2 IMPLANT
PENCIL SMOKE EVACUATOR (MISCELLANEOUS) ×2 IMPLANT
SET SHEATH INTRODUCER 10FR (MISCELLANEOUS) IMPLANT
SHEATH COOK PEEL AWAY SET 9F (SHEATH) IMPLANT
SLEEVE SCD COMPRESS KNEE MED (MISCELLANEOUS) ×2 IMPLANT
SPONGE LAP 4X18 RFD (DISPOSABLE) IMPLANT
STRIP CLOSURE SKIN 1/2X4 (GAUZE/BANDAGES/DRESSINGS) IMPLANT
SUT MON AB 4-0 PC3 18 (SUTURE) ×2 IMPLANT
SUT PROLENE 2 0 CT2 30 (SUTURE) IMPLANT
SUT PROLENE 2 0 SH DA (SUTURE) ×2 IMPLANT
SUT SILK 2 0 TIES 17X18 (SUTURE)
SUT SILK 2-0 18XBRD TIE BLK (SUTURE) IMPLANT
SUT VICRYL 3-0 CR8 SH (SUTURE) ×2 IMPLANT
SYR 5ML LUER SLIP (SYRINGE) ×2 IMPLANT
SYR CONTROL 10ML LL (SYRINGE) ×2 IMPLANT
TOWEL GREEN STERILE FF (TOWEL DISPOSABLE) ×4 IMPLANT
TUBE CONNECTING 20X1/4 (TUBING) IMPLANT
YANKAUER SUCT BULB TIP NO VENT (SUCTIONS) IMPLANT

## 2019-05-11 NOTE — Transfer of Care (Signed)
Immediate Anesthesia Transfer of Care Note  Patient: Heather Mckenzie  Procedure(s) Performed: INSERTION PORT-A-CATH WITH ULTRASOUND (N/A Chest)  Patient Location: PACU  Anesthesia Type:General  Level of Consciousness: awake, alert  and oriented  Airway & Oxygen Therapy: Patient Spontanous Breathing and Patient connected to face mask oxygen  Post-op Assessment: Report given to RN and Post -op Vital signs reviewed and stable  Post vital signs: Reviewed and stable  Last Vitals:  Vitals Value Taken Time  BP    Temp    Pulse 107 05/11/19 1710  Resp 18 05/11/19 1710  SpO2 100 % 05/11/19 1710  Vitals shown include unvalidated device data.  Last Pain:  Vitals:   05/11/19 1429  TempSrc: Temporal  PainSc: 0-No pain         Complications: No apparent anesthesia complications

## 2019-05-11 NOTE — Telephone Encounter (Signed)
Revealed negative genetic testing.  Revealed that VUS's in HOXB13, POLD1, and RAD50 were identified.  We discussed that we do not know why she has breast cancer or why there is cancer in the family. It could be due to a different gene that we are not testing, or something our current technology cannot pick up.  It will be important for her to keep in contact with genetics to learn if additional testing may be needed in the future.

## 2019-05-11 NOTE — Anesthesia Procedure Notes (Signed)
Procedure Name: LMA Insertion Date/Time: 05/11/2019 4:17 PM Performed by: Willa Frater, CRNA Pre-anesthesia Checklist: Patient identified, Emergency Drugs available, Suction available and Patient being monitored Patient Re-evaluated:Patient Re-evaluated prior to induction Oxygen Delivery Method: Circle system utilized Preoxygenation: Pre-oxygenation with 100% oxygen Induction Type: IV induction Ventilation: Mask ventilation without difficulty LMA: LMA inserted LMA Size: 4.0 Number of attempts: 1 Airway Equipment and Method: Bite block Placement Confirmation: positive ETCO2 Tube secured with: Tape Dental Injury: Teeth and Oropharynx as per pre-operative assessment

## 2019-05-11 NOTE — Progress Notes (Signed)
Patient Care Team: Patient, No Pcp Per as PCP - General (General Practice) [name], RN as Oncology Nurse Navigator [name], RN as Oncology Nurse Navigator [name], MD as Consulting Physician (General Surgery) [name], MD as Consulting Physician (Hematology and Oncology) [name], MD as Consulting Physician (Radiation Oncology)  DIAGNOSIS:    ICD-10-CM   1. Malignant neoplasm of overlapping sites of left breast in female, estrogen receptor negative ([place])  C50.812    Z17.1     SUMMARY OF ONCOLOGIC HISTORY: Oncology History  Malignant neoplasm of overlapping sites of left breast in female, estrogen receptor negative ([place])  04/25/2019 Initial Diagnosis   Patient palpated a left breast and axillary mass x34month. [place] left breast showed a 3.6cm mass at the 1 o'clock position with 6 smaller adjacent masses extending toward the nipple ranging in size from 1.1cm to 4.0cm. [place] the left axilla showed five bulky lymph nodes, the largest measuring 4.2cm, and second largest measuring 2.8cm. 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    04/27/2019 Cancer Staging   Staging form: Breast, AJCC 8th Edition - Clinical: Stage IIIC (cT1c, cN2a, cM0, G3, ER-, PR-, HER2-) - Signed by GNicholas Lose MD on 04/27/2019   05/12/2019 -  Chemotherapy   The patient had DOXOrubicin (ADRIAMYCIN) chemo injection 128 mg, 60 mg/m2 = 128 mg, Intravenous,  Once, 0 of 4 cycles palonosetron (ALOXI) injection 0.25 mg, 0.25 mg, Intravenous,  Once, 0 of 8 cycles pegfilgrastim-cbqv (UDENYCA) injection 6 mg, 6 mg, Subcutaneous, Once, 0 of 4 cycles CARBOplatin (PARAPLATIN) 700 mg in sodium chloride 0.9 % 250 mL chemo infusion, 700 mg (100 % of original dose 700 mg), Intravenous,  Once, 0 of 4 cycles Dose modification: 700 mg (original dose 700 mg, Cycle 5) cyclophosphamide (CYTOXAN) 1,280 mg in sodium chloride 0.9 % 250 mL chemo infusion, 600  mg/m2 = 1,280 mg, Intravenous,  Once, 0 of 4 cycles PACLitaxel (TAXOL) 174 mg in sodium chloride 0.9 % 250 mL chemo infusion (</= 865mm2), 80 mg/m2 = 174 mg, Intravenous,  Once, 0 of 4 cycles fosaprepitant (EMEND) 150 mg in sodium chloride 0.9 % 145 mL IVPB, 150 mg, Intravenous,  Once, 0 of 8 cycles pembrolizumab (KEYTRUDA) 200 mg in sodium chloride 0.9 % 50 mL chemo infusion, 2 mg/kg = 200 mg, Intravenous, Once, 0 of 8 cycles  for chemotherapy treatment.     Genetic Testing   Negative genetic testing. No pathogenic variants identified. VUS in HOXB13 called c.473C>A, VUS in POLD1 called c.1610G>C, and VUS in RAD50 called c.1094G>A identified on the [facility] Common Hereditary Cancers Panel. The report date is 05/10/2019.  The Common Hereditary Cancers Panel offered by [facility] includes sequencing and/or deletion duplication testing of the following 48 genes: APC, ATM, AXIN2, BARD1, BMPR1A, BRCA1, BRCA2, BRIP1, CDH1, CDKN2A (p14ARF), CDKN2A (p16INK4a), CKD4, CHEK2, CTNNA1, DICER1, EPCAM (Deletion/duplication testing only), GREM1 (promoter region deletion/duplication testing only), KIT, MEN1, MLH1, MSH2, MSH3, MSH6, MUTYH, NBN, NF1, NHTL1, PALB2, PDGFRA, PMS2, POLD1, POLE, PTEN, RAD50, RAD51C, RAD51D, RNF43, SDHB, SDHC, SDHD, SMAD4, SMARCA4. STK11, TP53, TSC1, TSC2, and VHL.  The following genes were evaluated for sequence changes only: SDHA and HOXB13 c.251G>A variant only.     CHIEF COMPLIANT: Cycle 1 Adriamycin and Cytoxan  INTERVAL HISTORY: Heather Mckenzie a [age].o. 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 She is currently on neoadjuvant chemotherapy with dose dense Adriamycin and Cytoxan. Genetic  testing was negative. Echo on 05/09/19 showed an ejection fraction of 65-70%. Her port was placed by Dr. [name] on 05/11/19. Bone scan on 05/09/19 showed bilateral low rib activity and a subtle focus of sternal activity possibly representing metastatic disease. CT CAP on 1/26/201  showed a cystic lesion on the left adnexa, 4.2cm, no sign of a discrete sternal lesion, and a pseudo lesion in the liver, 1.8cm, with mildly atypical features. She presents to the clinic today for cycle 1.   ALLERGIES:  has No Known Allergies.  MEDICATIONS:  Current Outpatient Medications  Medication Sig Dispense Refill  . ibuprofen (ADVIL,MOTRIN) 600 MG tablet Take 1 tablet (600 mg total) by mouth every 6 (six) hours as needed for pain. 30 tablet 2  . levonorgestrel-ethinyl estradiol (AMETHYST) 90-20 MCG tablet Take 1 tablet by mouth daily. (28) 90 mcg-20 mcg tablet    . lidocaine-prilocaine (EMLA) cream Apply to affected area once 30 g 3  . LORazepam (ATIVAN) 0.5 MG tablet Take 1 tablet (0.5 mg total) by mouth at bedtime as needed for sleep. 30 tablet 0  . ondansetron (ZOFRAN) 8 MG tablet Take 1 tablet (8 mg total) by mouth 2 (two) times daily as needed. Start on the third day after chemotherapy. 30 tablet 1  . oxyCODONE (OXY IR/ROXICODONE) 5 MG immediate release tablet Take 1 tablet (5 mg total) by mouth every 6 (six) hours as needed for severe pain. 10 tablet 0  . prochlorperazine (COMPAZINE) 10 MG tablet Take 1 tablet (10 mg total) by mouth every 6 (six) hours as needed (Nausea or vomiting). 30 tablet 1   No current facility-administered medications for this visit.    PHYSICAL EXAMINATION: ECOG PERFORMANCE STATUS: 1 - Symptomatic but completely ambulatory  Vitals:   05/12/19 1058  BP: 124/86  Pulse: 86  Resp: 17  Temp: (!) 97.1 F (36.2 C)  SpO2: 100%   Filed Weights   05/12/19 1058  Weight: 223 lb 3.2 oz (101.2 kg)    LABORATORY DATA:  I have reviewed the data as listed CMP Latest Ref Rng & Units 05/12/2019 04/27/2019 02/24/2011  Glucose 70 - 99 mg/dL 92 87 104(H)  BUN 6 - 20 mg/dL '7 9 9  ' Creatinine 0.44 - 1.00 mg/dL 0.72 0.71 0.57  Sodium 135 - 145 mmol/L 140 138 133(L)  Potassium 3.5 - 5.1 mmol/L 3.7 4.0 4.0  Chloride 98 - 111 mmol/L 105 103 100  CO2 22 - 32 mmol/L  '25 23 19  ' Calcium 8.9 - 10.3 mg/dL 8.8(L) 8.8(L) 9.3  Total Protein 6.5 - 8.1 g/dL 7.1 7.4 6.0  Total Bilirubin 0.3 - 1.2 mg/dL 0.4 0.4 0.1(L)  Alkaline Phos 38 - 126 U/L 85 111 262(H)  AST 15 - 41 U/L '15 18 19  ' ALT 0 - 44 U/L '16 19 13    ' Lab Results  Component Value Date   WBC 10.3 05/12/2019   HGB 12.2 05/12/2019   HCT 37.9 05/12/2019   MCV 85.0 05/12/2019   PLT 374 05/12/2019   NEUTROABS 6.0 05/12/2019    ASSESSMENT & PLAN:  Malignant neoplasm of overlapping sites of left breast in female, estrogen receptor negative ([place]) 04/25/2019:Patient palpated a left breast and axillary mass x64month. [place] the left breast showed a 3.6cm mass at the 1 o'clock position with 6 smaller adjacent masses extending toward the nipple ranging in size from 1.1cm to 4.0cm. [place] the left axilla showed five bulky lymph nodes, the largest measuring 4.2cm, and second largest measuring 2.8cm. Biopsy  showed IDC in the breast and axilla, grade 3, HER-2 - (1+), ER/PR -, Ki67 90%.  T1c N2 stage IIIc  Treatment plan: 1. Neoadjuvant chemotherapy with Adriamycin and Cytoxan every 3 weeks 4 followed by Taxol weekly 12 with carboplatin every 3 weeks and based on keynote 522 clinical trial we will add pembrolizumab every 3 weeks (8 cycles every 3 weeks) After she finishes neoadjuvant chemo appearing sternal lesions.  Go on pembrolizumab maintenance every 3 weeks for 9 more cycles 2. Followed by mastectomy and axillary lymph node dissection 3. Followed by adjuvant radiation therapy ----------------------------------------------------------------------------------------------------------------------------------------------------- Current treatment: Cycle 1 day 1 Adriamycin and Cytoxan with pembrolizumab Echocardiogram 05/09/2019: EF 65 to 70% Labs reviewed, chemo consent obtained Chemo education completed Bone scan: Sternal lesions CT chest abdomen pelvis was reviewed: It showed that the 2 sternal lesions do not  have any CT correlate's of significance.  A PET CT scan was recommended. Liver showed a vague abnormality for which we will obtain a liver MRI.  We are doing you Xenical for this round and we will make a decision on that for the future rounds depending on her blood counts.  Return to clinic in 1 week for toxicity check  No orders of the defined types were placed in this encounter.  The patient has a good understanding of the overall plan. she agrees with it. she will call with any problems that may develop before the next visit here.  Total time spent: 30 mins including face to face time and time spent for planning, charting and coordination of care  [name], MD 05/12/2019  I, Heather Mckenzie, am acting as scribe for Dr. [name].  I have reviewed the above documentation for accuracy and completeness, and I agree with the above.

## 2019-05-11 NOTE — Progress Notes (Signed)
HPI:  Heather Mckenzie was previously seen in the [facility] clinic due to a personal and family history of cancer and concerns regarding a hereditary predisposition to cancer. Please refer to our prior cancer genetics clinic note for more information regarding our discussion, assessment and recommendations, at the time. Heather Mckenzie recent genetic test results were disclosed to her, as were recommendations warranted by these results. These results and recommendations are discussed in more detail below.  CANCER HISTORY:  Oncology History  Malignant neoplasm of overlapping sites of left breast in female, estrogen receptor negative ([place])  04/25/2019 Initial Diagnosis   Patient palpated a left breast and axillary mass x21month. [place] the left breast showed a 3.6cm mass at the 1 o'clock position with 6 smaller adjacent masses extending toward the nipple ranging in size from 1.1cm to 4.0cm. [place] the left axilla showed five bulky lymph nodes, the largest measuring 4.2cm, and second largest measuring 2.8cm. 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    04/27/2019 Cancer Staging   Staging form: Breast, AJCC 8th Edition - Clinical: Stage IIIC (cT1c, cN2a, cM0, G3, ER-, PR-, HER2-) - Signed by GNicholas Lose MD on 04/27/2019   05/12/2019 -  Chemotherapy   The patient had DOXOrubicin (ADRIAMYCIN) chemo injection 128 mg, 60 mg/m2 = 128 mg, Intravenous,  Once, 0 of 4 cycles palonosetron (ALOXI) injection 0.25 mg, 0.25 mg, Intravenous,  Once, 0 of 8 cycles pegfilgrastim-cbqv (UDENYCA) injection 6 mg, 6 mg, Subcutaneous, Once, 0 of 4 cycles CARBOplatin (PARAPLATIN) 700 mg in sodium chloride 0.9 % 250 mL chemo infusion, 700 mg (100 % of original dose 700 mg), Intravenous,  Once, 0 of 4 cycles Dose modification: 700 mg (original dose 700 mg, Cycle 5) cyclophosphamide (CYTOXAN) 1,280 mg in sodium chloride 0.9 % 250 mL chemo infusion, 600 mg/m2 = 1,280 mg, Intravenous,   Once, 0 of 4 cycles PACLitaxel (TAXOL) 174 mg in sodium chloride 0.9 % 250 mL chemo infusion (</= 873mm2), 80 mg/m2 = 174 mg, Intravenous,  Once, 0 of 4 cycles fosaprepitant (EMEND) 150 mg in sodium chloride 0.9 % 145 mL IVPB, 150 mg, Intravenous,  Once, 0 of 8 cycles pembrolizumab (KEYTRUDA) 200 mg in sodium chloride 0.9 % 50 mL chemo infusion, 2 mg/kg = 200 mg, Intravenous, Once, 0 of 8 cycles  for chemotherapy treatment.     Genetic Testing   Negative genetic testing. No pathogenic variants identified. VUS in HOXB13 called c.473C>A, VUS in POLD1 called c.1610G>C, and VUS in RAD50 called c.1094G>A identified on the [facility] Common Hereditary Cancers Panel. The report date is 05/10/2019.  The Common Hereditary Cancers Panel offered by [facility] includes sequencing and/or deletion duplication testing of the following 48 genes: APC, ATM, AXIN2, BARD1, BMPR1A, BRCA1, BRCA2, BRIP1, CDH1, CDKN2A (p14ARF), CDKN2A (p16INK4a), CKD4, CHEK2, CTNNA1, DICER1, EPCAM (Deletion/duplication testing only), GREM1 (promoter region deletion/duplication testing only), KIT, MEN1, MLH1, MSH2, MSH3, MSH6, MUTYH, NBN, NF1, NHTL1, PALB2, PDGFRA, PMS2, POLD1, POLE, PTEN, RAD50, RAD51C, RAD51D, RNF43, SDHB, SDHC, SDHD, SMAD4, SMARCA4. STK11, TP53, TSC1, TSC2, and VHL.  The following genes were evaluated for sequence changes only: SDHA and HOXB13 c.251G>A variant only.     FAMILY HISTORY:  We obtained a detailed, 4-generation family history.  Significant diagnoses are listed below: Family History  Problem Relation Age of Onset  . Hypertension Mother   . Hypertension Father   . Kidney disease Father   . Diabetes Paternal Grandmother   .  Colon cancer Paternal Grandfather 50  . Prostate cancer Maternal Grandfather    Heather Mckenzie has one son, 34 and one daughter, 75. She has 3 sisters and 1 brother, no cancer history.  Heather Mckenzie mother is living at 50 with no history of cancer. Patient has 1 maternal aunt, no cancers, and no  cancer history for her maternal cousins. Maternal grandfather had prostate cancer, unknown age of diagnosis, living at 42. Maternal grandmother is living at 74 with no history of cancer.  Heather Mckenzie father is living at 78 with no cancer history. Patient has 3 paternal uncles and 2 paternal aunts, no cancers. No known cancers in paternal cousins. Paternal grandfather had colon cancer at 108 and died at 66. Paternal grandmother did not have cancer, died at 37.   Heather Mckenzie is unaware of previous family history of genetic testing for hereditary cancer risks. Patient's maternal ancestors are of [place] and [place] descent, and paternal ancestors are of [place] descent. There is no reported Ashkenazi Jewish ancestry. There is no known consanguinity.   GENETIC TEST RESULTS: Genetic testing reported out on 05/10/2019 through the [facility] Common Hereditary cancer panel found no pathogenic mutations.  The Common Hereditary Cancers Panel offered by [facility] includes sequencing and/or deletion duplication testing of the following 48 genes: APC, ATM, AXIN2, BARD1, BMPR1A, BRCA1, BRCA2, BRIP1, CDH1, CDKN2A (p14ARF), CDKN2A (p16INK4a), CKD4, CHEK2, CTNNA1, DICER1, EPCAM (Deletion/duplication testing only), GREM1 (promoter region deletion/duplication testing only), KIT, MEN1, MLH1, MSH2, MSH3, MSH6, MUTYH, NBN, NF1, NHTL1, PALB2, PDGFRA, PMS2, POLD1, POLE, PTEN, RAD50, RAD51C, RAD51D, RNF43, SDHB, SDHC, SDHD, SMAD4, SMARCA4. STK11, TP53, TSC1, TSC2, and VHL.  The following genes were evaluated for sequence changes only: SDHA and HOXB13 c.251G>A variant only.  The test report has been scanned into EPIC and is located under the Molecular Pathology section of the Results Review tab.  A portion of the result report is included below for reference.     We discussed with Heather Mckenzie that because current genetic testing is not perfect, it is possible there may be a gene mutation in one of these genes that current testing cannot  detect, but that chance is small.  We also discussed, that there could be another gene that has not yet been discovered, or that we have not yet tested, that is responsible for the cancer diagnoses in the family. It is also possible there is a hereditary cause for the cancer in the family that Heather Mckenzie did not inherit and therefore was not identified in her testing.  Therefore, it is important to remain in touch with cancer genetics in the future so that we can continue to offer Heather Mckenzie the most up to date genetic testing.   Genetic testing did identify 3 Variants of uncertain significance (VUS) - one in the HOXB13 gene called c.473C>A, a second in the POLD1 gene called c.1610G>C, and a third in the RAD50 gene called c.1094G>A.  At this time, it is unknown if these variants are associated with increased cancer risk or if they are normal findings, but most variants such as these get reclassified to being inconsequential. They should not be used to make medical management decisions. With time, we suspect the lab will determine the significance of these variants, if any. If we do learn more about them, we will try to contact Heather Mckenzie to discuss it further. However, it is important to stay in touch with [place] periodically and keep the address and phone number up to date.  ADDITIONAL GENETIC TESTING: We discussed with Heather Mckenzie that her genetic testing was fairly extensive.  If there are genes identified to increase cancer risk that can be analyzed in the future, we would be happy to discuss and coordinate this testing at that time.    CANCER SCREENING RECOMMENDATIONS: Heather Mckenzie test result is considered negative (normal).  This means that we have not identified a hereditary cause for her  personal and family history of cancer at this time. Most cancers happen by chance and this negative test suggests that her cancer may fall into this category.    While reassuring, this does not definitively rule out a hereditary  predisposition to cancer. It is still possible that there could be genetic mutations that are undetectable by current technology. There could be genetic mutations in genes that have not been tested or identified to increase cancer risk.  Therefore, it is recommended she continue to follow the cancer management and screening guidelines provided by her oncology and primary healthcare provider.   An individual's cancer risk and medical management are not determined by genetic test results alone. Overall cancer risk assessment incorporates additional factors, including personal medical history, family history, and any available genetic information that may result in a personalized plan for cancer prevention and surveillance.  RECOMMENDATIONS FOR FAMILY MEMBERS:  Relatives in this family might be at some increased risk of developing cancer, over the general population risk, simply due to the family history of cancer.  We recommended female relatives in this family have a yearly mammogram beginning at age 38, or 70 years younger than the earliest onset of cancer, an annual clinical breast exam, and perform monthly breast self-exams. Female relatives in this family should also have a gynecological exam as recommended by their primary provider. All family members should have a colonoscopy by age 64, or as directed by their physicians.  FOLLOW-UP: Lastly, we discussed with Heather Mckenzie that cancer genetics is a rapidly advancing field and it is possible that new genetic tests will be appropriate for her and/or her family members in the future. We encouraged her to remain in contact with cancer genetics on an annual basis so we can update her personal and family histories and let her know of advances in cancer genetics that may benefit this family.   Our contact number was provided. Heather Mckenzie questions were answered to her satisfaction, and she knows she is welcome to call us at anytime with additional questions or  concerns.   Faith Rogue, MS, [facility] Genetic Counselor [email or web address] Phone: [phone]

## 2019-05-11 NOTE — Interval H&P Note (Signed)
History and Physical Interval Note:  05/11/2019 3:03 PM  Heather Mckenzie  has presented today for surgery, with the diagnosis of poor venous access.  The various methods of treatment have been discussed with the patient and family. After consideration of risks, benefits and other options for treatment, the patient has consented to  Procedure(s): INSERTION PORT-A-CATH WITH ULTRASOUND (N/A) as a surgical intervention.  The patient's history has been reviewed, patient examined, no change in status, stable for surgery.  I have reviewed the patient's chart and labs.  Questions were answered to the patient's satisfaction.     [place]

## 2019-05-11 NOTE — Anesthesia Preprocedure Evaluation (Signed)
Anesthesia Evaluation  Patient identified by MRN, date of birth, ID band Patient awake    Reviewed: Allergy & Precautions, NPO status , Patient's Chart, lab work & pertinent test results  Airway Mallampati: II  TM Distance: >3 FB Neck ROM: Full    Dental no notable dental hx.    Pulmonary neg pulmonary ROS,    Pulmonary exam normal breath sounds clear to auscultation       Cardiovascular negative cardio ROS Normal cardiovascular exam Rhythm:Regular Rate:Normal     Neuro/Psych negative neurological ROS  negative psych ROS   GI/Hepatic negative GI ROS, Neg liver ROS,   Endo/Other  Morbid obesity  Renal/GU negative Renal ROS     Musculoskeletal negative musculoskeletal ROS (+)   Abdominal (+) + obese,   Peds  Hematology negative hematology ROS (+)   Anesthesia Other Findings   Reproductive/Obstetrics negative OB ROS                             Anesthesia Physical Anesthesia Plan  ASA: III  Anesthesia Plan: General   Post-op Pain Management:    Induction: Intravenous  PONV Risk Score and Plan: 4 or greater and Ondansetron, Dexamethasone, Treatment may vary due to age or medical condition and Midazolam  Airway Management Planned: LMA  Additional Equipment: None  Intra-op Plan:   Post-operative Plan: Extubation in OR  Informed Consent: I have reviewed the patients History and Physical, chart, labs and discussed the procedure including the risks, benefits and alternatives for the proposed anesthesia with the patient or authorized representative who has indicated his/her understanding and acceptance.     Dental advisory given  Plan Discussed with: CRNA  Anesthesia Plan Comments:         Anesthesia Quick Evaluation

## 2019-05-11 NOTE — Discharge Instructions (Signed)
PORT-A-CATH: POST OP INSTRUCTIONS  Always review your discharge instruction sheet given to you by the facility where your surgery was performed.   1. A prescription for pain medication may be given to you upon discharge. Take your pain medication as prescribed, if needed. If narcotic pain medicine is not needed, then you make take acetaminophen (Tylenol) or ibuprofen (Advil) as needed.  2. Take your usually prescribed medications unless otherwise directed. 3. If you need a refill on your pain medication, please contact our office. All narcotic pain medicine now requires a paper prescription.  Phoned in and fax refills are no longer allowed by law.  Prescriptions will not be filled after 5 pm or on weekends.  4. You should follow a light diet for the remainder of the day after your procedure. 5. Most patients will experience some mild swelling and/or bruising in the area of the incision. It may take several days to resolve. 6. It is common to experience some constipation if taking pain medication after surgery. Increasing fluid intake and taking a stool softener (such as Colace) will usually help or prevent this problem from occurring. A mild laxative (Milk of Magnesia or Miralax) should be taken according to package directions if there are no bowel movements after 48 hours.  7. Unless discharge instructions indicate otherwise, you may remove your bandages 48 hours after surgery, and you may shower at that time. You may have steri-strips (small white skin tapes) in place directly over the incision.  These strips should be left on the skin for 7-10 days.  If your surgeon used Dermabond (skin glue) on the incision, you may shower in 24 hours.  The glue will flake off over the next 2-3 weeks.  8. If your port is left accessed at the end of surgery (needle left in port), the dressing cannot get wet and should only by changed by a healthcare professional. When the port is no longer accessed (when the  needle has been removed), follow step 7.   9. ACTIVITIES:  Limit activity involving your arms for the next 72 hours. Do no strenuous exercise or activity for 1 week. You may drive when you are no longer taking prescription pain medication, you can comfortably wear a seatbelt, and you can maneuver your car. 10.You may need to see your doctor in the office for a follow-up appointment.  Please       check with your doctor.  11.When you receive a new Port-a-Cath, you will get a product guide and        ID card.  Please keep them in case you need them.  WHEN TO CALL YOUR DOCTOR [phone]): 1. Fever over 101.0 2. Chills 3. Continued bleeding from incision 4. Increased redness and tenderness at the site 5. Shortness of breath, difficulty breathing   The clinic staff is available to answer your questions during regular business hours. Please don't hesitate to call and ask to speak to one of the nurses or medical assistants for clinical concerns. If you have a medical emergency, go to the nearest emergency room or call 911.  A surgeon from [facility] is always on call at the hospital.     For further information, please visit [email or web address]    No tylenol until 8 pm, no ibuprofen until 10pm   Post Anesthesia Home Care Instructions  Activity: Get plenty of rest for the remainder of the day. A responsible individual must stay with you for 24 hours following the  procedure.  For the next 24 hours, DO NOT: -Drive a car -Paediatric nurse -Drink alcoholic beverages -Take any medication unless instructed by your physician -Make any legal decisions or sign important papers.  Meals: Start with liquid foods such as gelatin or soup. Progress to regular foods as tolerated. Avoid greasy, spicy, heavy foods. If nausea and/or vomiting occur, drink only clear liquids until the nausea and/or vomiting subsides. Call your physician if vomiting continues.  Special  Instructions/Symptoms: Your throat may feel dry or sore from the anesthesia or the breathing tube placed in your throat during surgery. If this causes discomfort, gargle with warm salt water. The discomfort should disappear within 24 hours.  If you had a scopolamine patch placed behind your ear for the management of post- operative nausea and/or vomiting:  1. The medication in the patch is effective for 72 hours, after which it should be removed.  Wrap patch in a tissue and discard in the trash. Wash hands thoroughly with soap and water. 2. You may remove the patch earlier than 72 hours if you experience unpleasant side effects which may include dry mouth, dizziness or visual disturbances. 3. Avoid touching the patch. Wash your hands with soap and water after contact with the patch.

## 2019-05-11 NOTE — Op Note (Signed)
Preoperative diagnosis: PAC needed FOR CHEMOTHERAPY /POOR VENOUS ACCESS  Postoperative diagnosis: Same  Procedure:  Right internal jugular 8 F clearview Portacath Placement with U/S and C arm guidence  Surgeon: Turner Daniels, MD, FACS  Anesthesia: General and 0.25 % marcaine with epinephrine  Clinical History and Indications: The patient is getting ready to begin chemotherapy for her cancer. She  needs a Port-A-Cath for venous access. Risk of bleeding, infection,  Collapse lung,  Death,  DVT,  Organ injury,  Mediastinal injury,  Injury to heart,  Injury to blood vessels,  Nerves,  Migration of catheter,  Embolization of catheter and the need for more surgery.  Description of Procedure: I have seen the patient in the holding area and confirmed the plans for the procedure as noted above. I reviewed the risks and complications again and the patient has no further questions. She wishes to proceed.   The patient was then taken to the operating room. After satisfactory general  anesthesia had been obtained the upper chest and lower neck were prepped and draped as a sterile field. The timeout was done.  The right internal jugular vein  was entered under U/S guidance  and the guidewire threaded into the superior vena cava right atrial area under fluoroscopic guidance. An incision was then made on the anterior chest wall and a subcutaneous pocket fashioned for the port reservoir.  The port tubing was then brought through a subcutaneous tunnel from the port site to the guidewire site.  The port and catheter were attached, locked  and flushed. The catheter was measured and cut to appropriate length.The dilator and peel-away sheath were then advanced over the guidewire while monitoring this with fluoroscopy. The guidewire and dilator were removed and the tubing threaded to approximately 19 cm. The peel-away sheath was then removed. The catheter aspirated and flushed easily. Using fluoroscopy the tip was in  the superior vena cava right atrial junction area. It aspirated and flushed easily. That aspirated and flushed easily.  The reservoir was secured to the fascia with 1 sutures of 2-0 Prolene. A final check with fluoroscopy was done to make sure we had no kinks and good positioning of the tip of the catheter. Everything appeared to be okay. The catheter was aspirated, flushed with dilute heparin and then concentrated aqueous heparin.  The incision was then closed with interrupted 3-0 Vicryl, and 4-0 Monocryl subcuticular with Dermabond on the skin.  There were no operative complications. Estimated blood loss was minimal. All counts were correct. The patient tolerated the procedure well.  Turner Daniels, MD, FACS

## 2019-05-12 ENCOUNTER — Telehealth: Payer: Self-pay | Admitting: *Deleted

## 2019-05-12 ENCOUNTER — Other Ambulatory Visit: Payer: Self-pay

## 2019-05-12 ENCOUNTER — Inpatient Hospital Stay: Payer: 59

## 2019-05-12 ENCOUNTER — Inpatient Hospital Stay (HOSPITAL_BASED_OUTPATIENT_CLINIC_OR_DEPARTMENT_OTHER): Payer: 59 | Admitting: Hematology and Oncology

## 2019-05-12 ENCOUNTER — Encounter: Payer: Self-pay | Admitting: *Deleted

## 2019-05-12 ENCOUNTER — Encounter: Payer: Self-pay | Admitting: Hematology and Oncology

## 2019-05-12 DIAGNOSIS — Z95828 Presence of other vascular implants and grafts: Secondary | ICD-10-CM

## 2019-05-12 DIAGNOSIS — Z171 Estrogen receptor negative status [ER-]: Secondary | ICD-10-CM

## 2019-05-12 DIAGNOSIS — C50812 Malignant neoplasm of overlapping sites of left female breast: Secondary | ICD-10-CM

## 2019-05-12 DIAGNOSIS — R293 Abnormal posture: Secondary | ICD-10-CM | POA: Diagnosis present

## 2019-05-12 LAB — CBC WITH DIFFERENTIAL (CANCER CENTER ONLY)
Abs Immature Granulocytes: 0.02 10*3/uL (ref 0.00–0.07)
Basophils Absolute: 0 10*3/uL (ref 0.0–0.1)
Basophils Relative: 0 %
Eosinophils Absolute: 0 10*3/uL (ref 0.0–0.5)
Eosinophils Relative: 0 %
HCT: 37.9 % (ref 36.0–46.0)
Hemoglobin: 12.2 g/dL (ref 12.0–15.0)
Immature Granulocytes: 0 %
Lymphocytes Relative: 33 %
Lymphs Abs: 3.4 10*3/uL (ref 0.7–4.0)
MCH: 27.4 pg (ref 26.0–34.0)
MCHC: 32.2 g/dL (ref 30.0–36.0)
MCV: 85 fL (ref 80.0–100.0)
Monocytes Absolute: 0.9 10*3/uL (ref 0.1–1.0)
Monocytes Relative: 8 %
Neutro Abs: 6 10*3/uL (ref 1.7–7.7)
Neutrophils Relative %: 59 %
Platelet Count: 374 10*3/uL (ref 150–400)
RBC: 4.46 MIL/uL (ref 3.87–5.11)
RDW: 13.2 % (ref 11.5–15.5)
WBC Count: 10.3 10*3/uL (ref 4.0–10.5)
nRBC: 0 % (ref 0.0–0.2)

## 2019-05-12 LAB — CMP (CANCER CENTER ONLY)
ALT: 16 U/L (ref 0–44)
AST: 15 U/L (ref 15–41)
Albumin: 3.5 g/dL (ref 3.5–5.0)
Alkaline Phosphatase: 85 U/L (ref 38–126)
Anion gap: 10 (ref 5–15)
BUN: 7 mg/dL (ref 6–20)
CO2: 25 mmol/L (ref 22–32)
Calcium: 8.8 mg/dL — ABNORMAL LOW (ref 8.9–10.3)
Chloride: 105 mmol/L (ref 98–111)
Creatinine: 0.72 mg/dL (ref 0.44–1.00)
GFR, Est AFR Am: >60 mL/min (ref >60)
GFR, Estimated: >60 mL/min (ref >60)
Glucose, Bld: 92 mg/dL (ref 70–99)
Potassium: 3.7 mmol/L (ref 3.5–5.1)
Sodium: 140 mmol/L (ref 135–145)
Total Bilirubin: 0.4 mg/dL (ref 0.3–1.2)
Total Protein: 7.1 g/dL (ref 6.5–8.1)

## 2019-05-12 MED ORDER — SODIUM CHLORIDE 0.9 % IV SOLN
Freq: Once | INTRAVENOUS | Status: AC
  Filled 2019-05-12: qty 250

## 2019-05-12 MED ORDER — SODIUM CHLORIDE 0.9 % IV SOLN
600.0000 mg/m2 | Freq: Once | INTRAVENOUS | Status: AC
  Administered 2019-05-12: 1280 mg via INTRAVENOUS
  Filled 2019-05-12: qty 64

## 2019-05-12 MED ORDER — SODIUM CHLORIDE 0.9 % IV SOLN
10.0000 mg | Freq: Once | INTRAVENOUS | Status: DC
  Filled 2019-05-12: qty 1

## 2019-05-12 MED ORDER — DOXORUBICIN HCL CHEMO IV INJECTION 2 MG/ML
60.0000 mg/m2 | Freq: Once | INTRAVENOUS | Status: AC
  Administered 2019-05-12: 128 mg via INTRAVENOUS
  Filled 2019-05-12: qty 64

## 2019-05-12 MED ORDER — SODIUM CHLORIDE 0.9 % IV SOLN
2.0000 mg/kg | Freq: Once | INTRAVENOUS | Status: AC
  Administered 2019-05-12: 200 mg via INTRAVENOUS
  Filled 2019-05-12: qty 8

## 2019-05-12 MED ORDER — SODIUM CHLORIDE 0.9 % IV SOLN
150.0000 mg | Freq: Once | INTRAVENOUS | Status: AC
  Administered 2019-05-12: 12:00:00 150 mg via INTRAVENOUS
  Filled 2019-05-12: qty 150

## 2019-05-12 MED ORDER — HEPARIN SOD (PORK) LOCK FLUSH 100 UNIT/ML IV SOLN
500.0000 [IU] | Freq: Once | INTRAVENOUS | Status: AC | PRN
  Administered 2019-05-12: 500 [IU]
  Filled 2019-05-12: qty 5

## 2019-05-12 MED ORDER — SODIUM CHLORIDE 0.9% FLUSH
10.0000 mL | INTRAVENOUS | Status: DC | PRN
  Administered 2019-05-12: 10 mL
  Filled 2019-05-12: qty 10

## 2019-05-12 MED ORDER — PALONOSETRON HCL INJECTION 0.25 MG/5ML
0.2500 mg | Freq: Once | INTRAVENOUS | Status: AC
  Administered 2019-05-12: 0.25 mg via INTRAVENOUS

## 2019-05-12 MED ORDER — PALONOSETRON HCL INJECTION 0.25 MG/5ML
INTRAVENOUS | Status: AC
  Filled 2019-05-12: qty 5

## 2019-05-12 MED ORDER — SODIUM CHLORIDE 0.9% FLUSH
10.0000 mL | INTRAVENOUS | Status: DC | PRN
  Administered 2019-05-12: 10 mL via INTRAVENOUS
  Filled 2019-05-12: qty 10

## 2019-05-12 MED ORDER — DEXAMETHASONE SODIUM PHOSPHATE 10 MG/ML IJ SOLN
10.0000 mg | Freq: Once | INTRAMUSCULAR | Status: AC
  Administered 2019-05-12: 10 mg via INTRAVENOUS

## 2019-05-12 MED ORDER — DEXAMETHASONE SODIUM PHOSPHATE 10 MG/ML IJ SOLN
INTRAMUSCULAR | Status: AC
  Filled 2019-05-12: qty 1

## 2019-05-12 NOTE — Progress Notes (Signed)
Met with patient in treatment area to introduce myself as Arboriculturist and to offer available resources.  Discussed one-time $1000 Radio broadcast assistant to assist with personal expenses while going through treatment.  Also discussed insurance OOP. Advised patient to contact insurance to find out what her OOP max is and whether or not she has satisfied her OOP for the year. I  advised her there is available copay assistance available for some treatment drugs if needed. She will call me with details.  Gave her an application for [employer] whom assists patients whom live in [place] only. Advised she may return application and supporting documents to me to be emailed or she may mail to address at the bottom of application.   Gave her my card if interested in applying and for any additional financial questions or concerns.

## 2019-05-12 NOTE — Assessment & Plan Note (Signed)
04/25/2019:Patient palpated a left breast and axillary mass x110month. [place] left breast showed a 3.6cm mass at the 1 o'clock position with 6 smaller adjacent masses extending toward the nipple ranging in size from 1.1cm to 4.0cm. [place] left axilla showed five bulky lymph nodes, the largest measuring 4.2cm, and second largest measuring 2.8cm. 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 Neoadjuvant chemotherapy with Adriamycin and Cytoxan every 3 weeks 4 followed by Taxol weekly 12 with carboplatin every 3 weeks and based on keynote 522 clinical trial we will add pembrolizumab every 3 weeks (8 cycles every 3 weeks) After she finishes neoadjuvant chemo she will go on pembrolizumab maintenance every 3 weeks for 9 more cycles 2. Followed by mastectomy and axillary lymph node dissection 3. Followed by adjuvant radiation therapy ----------------------------------------------------------------------------------------------------------------------------------------------------- Current treatment: Cycle 1 day 1 dose dense Adriamycin and Cytoxan with pembrolizumab Echocardiogram 05/09/2019: EF 65 to 70% Labs reviewed, chemo consent obtained Chemo education completed Return to clinic in 1 week for toxicity check

## 2019-05-12 NOTE — Telephone Encounter (Signed)
Called pt to discuss MRI appt and assess needs prior to 1st chemo. Confirmed MRI for 1/30 at 9:40. Discussed EMLA cream placement and when to put on. Denies further questions or needs at this time.

## 2019-05-12 NOTE — Patient Instructions (Signed)
[facility] Discharge Instructions for Patients Receiving Chemotherapy  Today you received the following chemotherapy agents Keytruda, Adriamycin and Cytoxan  To help prevent nausea and vomiting after your treatment, we encourage you to take your nausea medication as directed.    If you develop nausea and vomiting that is not controlled by your nausea medication, call the clinic.   BELOW ARE SYMPTOMS THAT SHOULD BE REPORTED IMMEDIATELY:  *FEVER GREATER THAN 100.5 F  *CHILLS WITH OR WITHOUT FEVER  NAUSEA AND VOMITING THAT IS NOT CONTROLLED WITH YOUR NAUSEA MEDICATION  *UNUSUAL SHORTNESS OF BREATH  *UNUSUAL BRUISING OR BLEEDING  TENDERNESS IN MOUTH AND THROAT WITH OR WITHOUT PRESENCE OF ULCERS  *URINARY PROBLEMS  *BOWEL PROBLEMS  UNUSUAL RASH Items with * indicate a potential emergency and should be followed up as soon as possible.  Feel free to call the clinic should you have any questions or concerns. The clinic phone number is [phone].  Please show the [facility] at check-in to the Emergency Department and triage nurse.  Pembrolizumab injection What is this medicine? PEMBROLIZUMAB (pem broe liz ue mab) is a monoclonal antibody. It is used to treat certain types of cancer. This medicine may be used for other purposes; ask your health care provider or pharmacist if you have questions. COMMON BRAND NAME(S): Keytruda What should I tell my health care provider before I take this medicine? They need to know if you have any of these conditions:  diabetes  immune system problems  inflammatory bowel disease  liver disease  lung or breathing disease  lupus  received or scheduled to receive an organ transplant or a stem-cell transplant that uses donor stem cells  an unusual or allergic reaction to pembrolizumab, other medicines, foods, dyes, or preservatives  pregnant or trying to get pregnant  breast-feeding How should I use this  medicine? This medicine is for infusion into a vein. It is given by a health care professional in a hospital or clinic setting. A special MedGuide will be given to you before each treatment. Be sure to read this information carefully each time. Talk to your pediatrician regarding the use of this medicine in children. While this drug may be prescribed for children as young as 6 months for selected conditions, precautions do apply. Overdosage: If you think you have taken too much of this medicine contact a poison control center or emergency room at once. NOTE: This medicine is only for you. Do not share this medicine with others. What if I miss a dose? It is important not to miss your dose. Call your doctor or health care professional if you are unable to keep an appointment. What may interact with this medicine? Interactions have not been studied. Give your health care provider a list of all the medicines, herbs, non-prescription drugs, or dietary supplements you use. Also tell them if you smoke, drink alcohol, or use illegal drugs. Some items may interact with your medicine. This list may not describe all possible interactions. Give your health care provider a list of all the medicines, herbs, non-prescription drugs, or dietary supplements you use. Also tell them if you smoke, drink alcohol, or use illegal drugs. Some items may interact with your medicine. What should I watch for while using this medicine? Your condition will be monitored carefully while you are receiving this medicine. You may need blood work done while you are taking this medicine. Do not become pregnant while taking this medicine or for 4 months after  stopping it. Women should inform their doctor if they wish to become pregnant or think they might be pregnant. There is a potential for serious side effects to an unborn child. Talk to your health care professional or pharmacist for more information. Do not breast-feed an infant while  taking this medicine or for 4 months after the last dose. What side effects may I notice from receiving this medicine? Side effects that you should report to your doctor or health care professional as soon as possible:  allergic reactions like skin rash, itching or hives, swelling of the face, lips, or tongue  bloody or black, tarry  breathing problems  changes in vision  chest pain  chills  confusion  constipation  cough  diarrhea  dizziness or feeling faint or lightheaded  fast or irregular heartbeat  fever  flushing  joint pain  low blood counts - this medicine may decrease the number of white blood cells, red blood cells and platelets. You may be at increased risk for infections and bleeding.  muscle pain  muscle weakness  pain, tingling, numbness in the hands or feet  persistent headache  redness, blistering, peeling or loosening of the skin, including inside the mouth  signs and symptoms of high blood sugar such as dizziness; dry mouth; dry skin; fruity breath; nausea; stomach pain; increased hunger or thirst; increased urination  signs and symptoms of kidney injury like trouble passing urine or change in the amount of urine  signs and symptoms of liver injury like dark urine, light-colored stools, loss of appetite, nausea, right upper belly pain, yellowing of the eyes or skin  sweating  swollen lymph nodes  weight loss Side effects that usually do not require medical attention (report to your doctor or health care professional if they continue or are bothersome):  decreased appetite  hair loss  muscle pain  tiredness This list may not describe all possible side effects. Call your doctor for medical advice about side effects. You may report side effects to FDA at 1-800-FDA-1088. Where should I keep my medicine? This drug is given in a hospital or clinic and will not be stored at home. NOTE: This sheet is a summary. It may not cover all  possible information. If you have questions about this medicine, talk to your doctor, pharmacist, or health care provider.  2020 Elsevier/Gold Standard (2019-02-04 18:07:58)  Doxorubicin injection What is this medicine? DOXORUBICIN (dox oh ROO bi sin) is a chemotherapy drug. It is used to treat many kinds of cancer like leukemia, lymphoma, neuroblastoma, sarcoma, and Wilms' tumor. It is also used to treat bladder cancer, breast cancer, lung cancer, ovarian cancer, stomach cancer, and thyroid cancer. This medicine may be used for other purposes; ask your health care provider or pharmacist if you have questions. COMMON BRAND NAME(S): Adriamycin, Adriamycin PFS, Adriamycin RDF, Rubex What should I tell my health care provider before I take this medicine? They need to know if you have any of these conditions:  heart disease  history of low blood counts caused by a medicine  liver disease  recent or ongoing radiation therapy  an unusual or allergic reaction to doxorubicin, other chemotherapy agents, other medicines, foods, dyes, or preservatives  pregnant or trying to get pregnant  breast-feeding How should I use this medicine? This drug is given as an infusion into a vein. It is administered in a hospital or clinic by a specially trained health care professional. If you have pain, swelling, burning or any unusual feeling  around the site of your injection, tell your health care professional right away. Talk to your pediatrician regarding the use of this medicine in children. Special care may be needed. Overdosage: If you think you have taken too much of this medicine contact a poison control center or emergency room at once. NOTE: This medicine is only for you. Do not share this medicine with others. What if I miss a dose? It is important not to miss your dose. Call your doctor or health care professional if you are unable to keep an appointment. What may interact with this medicine? This  medicine may interact with the following medications:  6-mercaptopurine  paclitaxel  phenytoin  St. John's Wort  trastuzumab  verapamil This list may not describe all possible interactions. Give your health care provider a list of all the medicines, herbs, non-prescription drugs, or dietary supplements you use. Also tell them if you smoke, drink alcohol, or use illegal drugs. Some items may interact with your medicine. What should I watch for while using this medicine? This drug may make you feel generally unwell. This is not uncommon, as chemotherapy can affect healthy cells as well as cancer cells. Report any side effects. Continue your course of treatment even though you feel ill unless your doctor tells you to stop. There is a maximum amount of this medicine you should receive throughout your life. The amount depends on the medical condition being treated and your overall health. Your doctor will watch how much of this medicine you receive in your lifetime. Tell your doctor if you have taken this medicine before. You may need blood work done while you are taking this medicine. Your urine may turn red for a few days after your dose. This is not blood. If your urine is dark or brown, call your doctor. In some cases, you may be given additional medicines to help with side effects. Follow all directions for their use. Call your doctor or health care professional for advice if you get a fever, chills or sore throat, or other symptoms of a cold or flu. Do not treat yourself. This drug decreases your body's ability to fight infections. Try to avoid being around people who are sick. This medicine may increase your risk to bruise or bleed. Call your doctor or health care professional if you notice any unusual bleeding. Talk to your doctor about your risk of cancer. You may be more at risk for certain types of cancers if you take this medicine. Do not become pregnant while taking this medicine or  for 6 months after stopping it. Women should inform their doctor if they wish to become pregnant or think they might be pregnant. Men should not father a child while taking this medicine and for 6 months after stopping it. There is a potential for serious side effects to an unborn child. Talk to your health care professional or pharmacist for more information. Do not breast-feed an infant while taking this medicine. This medicine has caused ovarian failure in some women and reduced sperm counts in some men This medicine may interfere with the ability to have a child. Talk with your doctor or health care professional if you are concerned about your fertility. This medicine may cause a decrease in Co-Enzyme Q-10. You should make sure that you get enough Co-Enzyme Q-10 while you are taking this medicine. Discuss the foods you eat and the vitamins you take with your health care professional. What side effects may I notice from receiving  this medicine? Side effects that you should report to your doctor or health care professional as soon as possible:  allergic reactions like skin rash, itching or hives, swelling of the face, lips, or tongue  breathing problems  chest pain  fast or irregular heartbeat  low blood counts - this medicine may decrease the number of white blood cells, red blood cells and platelets. You may be at increased risk for infections and bleeding.  pain, redness, or irritation at site where injected  signs of infection - fever or chills, cough, sore throat, pain or difficulty passing urine  signs of decreased platelets or bleeding - bruising, pinpoint red spots on the skin, black, tarry stools, blood in the urine  swelling of the ankles, feet, hands  tiredness  weakness Side effects that usually do not require medical attention (report to your doctor or health care professional if they continue or are bothersome):  diarrhea  hair loss  mouth sores  nail discoloration  or damage  nausea  red colored urine  vomiting This list may not describe all possible side effects. Call your doctor for medical advice about side effects. You may report side effects to FDA at 1-800-FDA-1088. Where should I keep my medicine? This drug is given in a hospital or clinic and will not be stored at home. NOTE: This sheet is a summary. It may not cover all possible information. If you have questions about this medicine, talk to your doctor, pharmacist, or health care provider.  2020 Elsevier/Gold Standard (2016-11-12 11:01:26)  Cyclophosphamide Injection What is this medicine? CYCLOPHOSPHAMIDE (sye kloe FOSS fa mide) is a chemotherapy drug. It slows the growth of cancer cells. This medicine is used to treat many types of cancer like lymphoma, myeloma, leukemia, breast cancer, and ovarian cancer, to name a few. This medicine may be used for other purposes; ask your health care provider or pharmacist if you have questions. COMMON BRAND NAME(S): Cytoxan, Neosar What should I tell my health care provider before I take this medicine? They need to know if you have any of these conditions:  heart disease  history of irregular heartbeat  infection  kidney disease  liver disease  low blood counts, like white cells, platelets, or red blood cells  on hemodialysis  recent or ongoing radiation therapy  scarring or thickening of the lungs  trouble passing urine  an unusual or allergic reaction to cyclophosphamide, other medicines, foods, dyes, or preservatives  pregnant or trying to get pregnant  breast-feeding How should I use this medicine? This drug is usually given as an injection into a vein or muscle or by infusion into a vein. It is administered in a hospital or clinic by a specially trained health care professional. Talk to your pediatrician regarding the use of this medicine in children. Special care may be needed. Overdosage: If you think you have taken too  much of this medicine contact a poison control center or emergency room at once. NOTE: This medicine is only for you. Do not share this medicine with others. What if I miss a dose? It is important not to miss your dose. Call your doctor or health care professional if you are unable to keep an appointment. What may interact with this medicine?  amphotericin B  azathioprine  certain antivirals for HIV or hepatitis  certain medicines for blood pressure, heart disease, irregular heart beat  certain medicines that treat or prevent blood clots like warfarin  certain other medicines for cancer  cyclosporine  etanercept  indomethacin  medicines that relax muscles for surgery  medicines to increase blood counts  metronidazole This list may not describe all possible interactions. Give your health care provider a list of all the medicines, herbs, non-prescription drugs, or dietary supplements you use. Also tell them if you smoke, drink alcohol, or use illegal drugs. Some items may interact with your medicine. What should I watch for while using this medicine? Your condition will be monitored carefully while you are receiving this medicine. You may need blood work done while you are taking this medicine. Drink water or other fluids as directed. Urinate often, even at night. Some products may contain alcohol. Ask your health care professional if this medicine contains alcohol. Be sure to tell all health care professionals you are taking this medicine. Certain medicines, like metronidazole and disulfiram, can cause an unpleasant reaction when taken with alcohol. The reaction includes flushing, headache, nausea, vomiting, sweating, and increased thirst. The reaction can last from 30 minutes to several hours. Do not become pregnant while taking this medicine or for 1 year after stopping it. Women should inform their health care professional if they wish to become pregnant or think they might be  pregnant. Men should not father a child while taking this medicine and for 4 months after stopping it. There is potential for serious side effects to an unborn child. Talk to your health care professional for more information. Do not breast-feed an infant while taking this medicine or for 1 week after stopping it. This medicine has caused ovarian failure in some women. This medicine may make it more difficult to get pregnant. Talk to your health care professional if you are concerned about your fertility. This medicine has caused decreased sperm counts in some men. This may make it more difficult to father a child. Talk to your health care professional if you are concerned about your fertility. Call your health care professional for advice if you get a fever, chills, or sore throat, or other symptoms of a cold or flu. Do not treat yourself. This medicine decreases your body's ability to fight infections. Try to avoid being around people who are sick. Avoid taking medicines that contain aspirin, acetaminophen, ibuprofen, naproxen, or ketoprofen unless instructed by your health care professional. These medicines may hide a fever. Talk to your health care professional about your risk of cancer. You may be more at risk for certain types of cancer if you take this medicine. If you are going to need surgery or other procedure, tell your health care professional that you are using this medicine. Be careful brushing or flossing your teeth or using a toothpick because you may get an infection or bleed more easily. If you have any dental work done, tell your dentist you are receiving this medicine. What side effects may I notice from receiving this medicine? Side effects that you should report to your doctor or health care professional as soon as possible:  allergic reactions like skin rash, itching or hives, swelling of the face, lips, or tongue  breathing problems  nausea, vomiting  signs and symptoms of  bleeding such as bloody or black, tarry stools; red or dark brown urine; spitting up blood or brown material that looks like coffee grounds; red spots on the skin; unusual bruising or bleeding from the eyes, gums, or nose  signs and symptoms of heart failure like fast, irregular heartbeat, sudden weight gain; swelling of the ankles, feet, hands  signs and symptoms of infection  like fever; chills; cough; sore throat; pain or trouble passing urine  signs and symptoms of kidney injury like trouble passing urine or change in the amount of urine  signs and symptoms of liver injury like dark yellow or brown urine; general ill feeling or flu-like symptoms; light-colored stools; loss of appetite; nausea; right upper belly pain; unusually weak or tired; yellowing of the eyes or skin Side effects that usually do not require medical attention (report to your doctor or health care professional if they continue or are bothersome):  confusion  decreased hearing  diarrhea  facial flushing  hair loss  headache  loss of appetite  missed menstrual periods  signs and symptoms of low red blood cells or anemia such as unusually weak or tired; feeling faint or lightheaded; falls  skin discoloration This list may not describe all possible side effects. Call your doctor for medical advice about side effects. You may report side effects to FDA at 1-800-FDA-1088. Where should I keep my medicine? This drug is given in a hospital or clinic and will not be stored at home. NOTE: This sheet is a summary. It may not cover all possible information. If you have questions about this medicine, talk to your doctor, pharmacist, or health care provider.  2020 Elsevier/Gold Standard (2019-01-03 09:53:29)

## 2019-05-12 NOTE — Patient Instructions (Addendum)

## 2019-05-12 NOTE — Anesthesia Postprocedure Evaluation (Signed)
Anesthesia Post Note  Patient: Lonia Blood  Procedure(s) Performed: INSERTION PORT-A-CATH WITH ULTRASOUND (N/A Chest)     Patient location during evaluation: PACU Anesthesia Type: General Level of consciousness: awake and alert Pain management: pain level controlled Vital Signs Assessment: post-procedure vital signs reviewed and stable Respiratory status: spontaneous breathing, nonlabored ventilation and respiratory function stable Cardiovascular status: blood pressure returned to baseline and stable Postop Assessment: no apparent nausea or vomiting Anesthetic complications: no    Last Vitals:  Vitals:   05/11/19 1730 05/11/19 1745  BP: 126/83 135/83  Pulse: 84 73  Resp: (!) 21 (!) 22  Temp:  37.2 C  SpO2: 100% 100%    Last Pain:  Vitals:   05/11/19 1745  TempSrc: Oral  PainSc: Adell Dedrick Heffner

## 2019-05-12 NOTE — Telephone Encounter (Signed)
This RN was notified by pre auth personnel - MRI scheduled for [place] imaging needs to be done at [facility] due to [employer].  All scans must be done at [facility] for appropriate coverage under her [place] [employer].  This RN called Central Scheduling to reschedule- obtained appointment time for 05/18/2019 at 7 am- delay is due to need for " new coil for breast MRI machine ".  Pt is being seen today by MD and will be made aware of above information.

## 2019-05-13 ENCOUNTER — Ambulatory Visit
Admission: RE | Admit: 2019-05-13 | Discharge: 2019-05-13 | Disposition: A | Discharge status: Home / Self Care | Payer: 59 | Source: Ambulatory Visit | Attending: Hematology and Oncology | Admitting: Hematology and Oncology

## 2019-05-13 ENCOUNTER — Telehealth: Payer: Self-pay | Admitting: Emergency Medicine

## 2019-05-13 ENCOUNTER — Telehealth: Payer: Self-pay | Admitting: *Deleted

## 2019-05-13 ENCOUNTER — Other Ambulatory Visit: Payer: 59

## 2019-05-13 DIAGNOSIS — Z171 Estrogen receptor negative status [ER-]: Secondary | ICD-10-CM

## 2019-05-13 DIAGNOSIS — C50812 Malignant neoplasm of overlapping sites of left female breast: Secondary | ICD-10-CM

## 2019-05-13 LAB — THYROID PANEL WITH TSH
Free Thyroxine Index: 2.6 (ref 1.2–4.9)
T3 Uptake Ratio: 25 % (ref 24–39)
T4, Total: 10.2 ug/dL (ref 4.5–12.0)
TSH: 0.771 u[IU]/mL (ref 0.450–4.500)

## 2019-05-13 MED ORDER — GADOBUTROL 1 MMOL/ML IV SOLN
10.0000 mL | Freq: Once | INTRAVENOUS | Status: AC | PRN
  Administered 2019-05-13: 10 mL via INTRAVENOUS

## 2019-05-13 NOTE — Telephone Encounter (Signed)
Chemo f/u call, spoke w/patient who denies any questions or concerns at this time.  Reports taking nausea medicine as prescribed and no events of N/V.  Pt also spoke with RN Navigator Dawn this am.  Pt aware to f/u as needed.

## 2019-05-13 NOTE — Telephone Encounter (Signed)
Received appt for MRI at GI. Called pt and confirmed for toay 1/29 at 3pm.  Pt relate doing well after 1st chemo, reinforced anti-nausea medications and discussed claritin after neulasta injection. Received verbal understanding.

## 2019-05-13 NOTE — Telephone Encounter (Signed)
-----   Message from Veverly Fells, RN sent at 05/12/2019  3:08 PM EST ----- Regarding: GUDENA: First time follow-up First time Keytruda, Adriamycin and Cytoxan today. Tolerated well with no complaints

## 2019-05-14 ENCOUNTER — Inpatient Hospital Stay: Payer: 59

## 2019-05-14 ENCOUNTER — Other Ambulatory Visit: Payer: Self-pay

## 2019-05-14 ENCOUNTER — Other Ambulatory Visit: Payer: 59

## 2019-05-14 VITALS — BP 148/88 | HR 91 | Temp 98.1°F | Resp 16

## 2019-05-14 DIAGNOSIS — C50812 Malignant neoplasm of overlapping sites of left female breast: Secondary | ICD-10-CM

## 2019-05-14 DIAGNOSIS — Z171 Estrogen receptor negative status [ER-]: Secondary | ICD-10-CM

## 2019-05-14 MED ORDER — PEGFILGRASTIM-CBQV 6 MG/0.6ML ~~LOC~~ SOSY
6.0000 mg | PREFILLED_SYRINGE | Freq: Once | SUBCUTANEOUS | Status: AC
  Administered 2019-05-14: 6 mg via SUBCUTANEOUS

## 2019-05-14 NOTE — Patient Instructions (Signed)
Pegfilgrastim injection What is this medicine? PEGFILGRASTIM (PEG fil gra stim) is a long-acting granulocyte colony-stimulating factor that stimulates the growth of neutrophils, a type of white blood cell important in the body's fight against infection. It is used to reduce the incidence of fever and infection in patients with certain types of cancer who are receiving chemotherapy that affects the bone marrow, and to increase survival after being exposed to high doses of radiation. This medicine may be used for other purposes; ask your health care provider or pharmacist if you have questions. COMMON BRAND NAME(S): Fulphila, Neulasta, UDENYCA, Ziextenzo What should I tell my health care provider before I take this medicine? They need to know if you have any of these conditions:  kidney disease  latex allergy  ongoing radiation therapy  sickle cell disease  skin reactions to acrylic adhesives (On-Body Injector only)  an unusual or allergic reaction to pegfilgrastim, filgrastim, other medicines, foods, dyes, or preservatives  pregnant or trying to get pregnant  breast-feeding How should I use this medicine? This medicine is for injection under the skin. If you get this medicine at home, you will be taught how to prepare and give the pre-filled syringe or how to use the On-body Injector. Refer to the patient Instructions for Use for detailed instructions. Use exactly as directed. Tell your healthcare provider immediately if you suspect that the On-body Injector may not have performed as intended or if you suspect the use of the On-body Injector resulted in a missed or partial dose. It is important that you put your used needles and syringes in a special sharps container. Do not put them in a trash can. If you do not have a sharps container, call your pharmacist or healthcare provider to get one. Talk to your pediatrician regarding the use of this medicine in children. While this drug may be  prescribed for selected conditions, precautions do apply. Overdosage: If you think you have taken too much of this medicine contact a poison control center or emergency room at once. NOTE: This medicine is only for you. Do not share this medicine with others. What if I miss a dose? It is important not to miss your dose. Call your doctor or health care professional if you miss your dose. If you miss a dose due to an On-body Injector failure or leakage, a new dose should be administered as soon as possible using a single prefilled syringe for manual use. What may interact with this medicine? Interactions have not been studied. Give your health care provider a list of all the medicines, herbs, non-prescription drugs, or dietary supplements you use. Also tell them if you smoke, drink alcohol, or use illegal drugs. Some items may interact with your medicine. This list may not describe all possible interactions. Give your health care provider a list of all the medicines, herbs, non-prescription drugs, or dietary supplements you use. Also tell them if you smoke, drink alcohol, or use illegal drugs. Some items may interact with your medicine. What should I watch for while using this medicine? You may need blood work done while you are taking this medicine. If you are going to need a MRI, CT scan, or other procedure, tell your doctor that you are using this medicine (On-Body Injector only). What side effects may I notice from receiving this medicine? Side effects that you should report to your doctor or health care professional as soon as possible:  allergic reactions like skin rash, itching or hives, swelling of the   face, lips, or tongue  back pain  dizziness  fever  pain, redness, or irritation at site where injected  pinpoint red spots on the skin  red or dark-brown urine  shortness of breath or breathing problems  stomach or side pain, or pain at the  shoulder  swelling  tiredness  trouble passing urine or change in the amount of urine Side effects that usually do not require medical attention (report to your doctor or health care professional if they continue or are bothersome):  bone pain  muscle pain This list may not describe all possible side effects. Call your doctor for medical advice about side effects. You may report side effects to FDA at 1-800-FDA-1088. Where should I keep my medicine? Keep out of the reach of children. If you are using this medicine at home, you will be instructed on how to store it. Throw away any unused medicine after the expiration date on the label. NOTE: This sheet is a summary. It may not cover all possible information. If you have questions about this medicine, talk to your doctor, pharmacist, or health care provider.  2020 Elsevier/Gold Standard (2017-07-06 16:57:08)  Coronavirus (COVID-19) Are you at risk?  Are you at risk for the Coronavirus (COVID-19)?  To be considered HIGH RISK for Coronavirus (COVID-19), you have to meet the following criteria:  . Traveled to [place], [place], [place], [place] or [place]; or in the [place] to [place], [place], [place], or [place]; and have fever, cough, and shortness of breath within the last 2 weeks of travel OR . Been in close contact with a person diagnosed with COVID-19 within the last 2 weeks and have fever, cough, and shortness of breath . IF YOU DO NOT MEET THESE CRITERIA, YOU ARE CONSIDERED LOW RISK FOR COVID-19.  What to do if you are HIGH RISK for COVID-19?  Marland Kitchen If you are having a medical emergency, call 911. . Seek medical care right away. Before you go to a doctor's office, urgent care or emergency department, call ahead and tell them about your recent travel, contact with someone diagnosed with COVID-19, and your symptoms. You should receive instructions from your physician's office regarding next steps of care.  . When you arrive  at healthcare provider, tell the healthcare staff immediately you have returned from visiting [place], [place], [place], [place] or [place]; or traveled in the [place] to [place], [place], [place], or [place]; in the last two weeks or you have been in close contact with a person diagnosed with COVID-19 in the last 2 weeks.   . Tell the health care staff about your symptoms: fever, cough and shortness of breath. . After you have been seen by a medical provider, you will be either: o Tested for (COVID-19) and discharged home on quarantine except to seek medical care if symptoms worsen, and asked to  - Stay home and avoid contact with others until you get your results (4-5 days)  - Avoid travel on public transportation if possible (such as bus, train, or airplane) or o Sent to the Emergency Department by EMS for evaluation, COVID-19 testing, and possible admission depending on your condition and test results.  What to do if you are LOW RISK for COVID-19?  Reduce your risk of any infection by using the same precautions used for avoiding the common cold or flu:  Marland Kitchen Wash your hands often with soap and warm water for at least 20 seconds.  If soap  and water are not readily available, use an alcohol-based hand sanitizer with at least 60% alcohol.  . If coughing or sneezing, cover your mouth and nose by coughing or sneezing into the elbow areas of your shirt or coat, into a tissue or into your sleeve (not your hands). . Avoid shaking hands with others and consider head nods or verbal greetings only. . Avoid touching your eyes, nose, or mouth with unwashed hands.  . Avoid close contact with people who are sick. . Avoid places or events with large numbers of people in one location, like concerts or sporting events. . Carefully consider travel plans you have or are making. . If you are planning any travel outside or inside the [place], visit the CDC's Travelers' Health webpage for the latest health  notices. . If you have some symptoms but not all symptoms, continue to monitor at home and seek medical attention if your symptoms worsen. . If you are having a medical emergency, call 911.   [employer] / e-Visit: [email or web address]         [facility]: [facility]: [id number]                   [facility]: [phone]

## 2019-05-16 ENCOUNTER — Encounter: Payer: Self-pay | Admitting: *Deleted

## 2019-05-18 ENCOUNTER — Ambulatory Visit (HOSPITAL_COMMUNITY): Payer: 59

## 2019-05-18 NOTE — Progress Notes (Signed)
Heather Mckenzie Care Team: Heather Mckenzie, No Pcp Per as PCP - General (General Practice) [name], RN as Oncology Nurse Navigator [name], RN as Oncology Nurse Navigator [name], MD as Consulting Physician (General Surgery) [name], MD as Consulting Physician (Hematology and Oncology) [name], MD as Consulting Physician (Radiation Oncology)  DIAGNOSIS:    ICD-10-CM   1. Malignant neoplasm of overlapping sites of left breast in female, estrogen receptor negative ([place])  C50.812    Z17.1     SUMMARY OF ONCOLOGIC HISTORY: Oncology History  Malignant neoplasm of overlapping sites of left breast in female, estrogen receptor negative ([place])  04/25/2019 Initial Diagnosis   Heather Mckenzie palpated a left breast and axillary mass x103month. [place] left breast showed a 3.6cm mass at the 1 o'clock position with 6 smaller adjacent masses extending toward the nipple ranging in size from 1.1cm to 4.0cm. [place] the left axilla showed five bulky lymph nodes, the largest measuring 4.2cm, and second largest measuring 2.8cm. 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    04/27/2019 Cancer Staging   Staging form: Breast, AJCC 8th Edition - Clinical: Stage IIIC (cT1c, cN2a, cM0, G3, ER-, PR-, HER2-) - Signed by GNicholas Lose MD on 04/27/2019   05/12/2019 -  Chemotherapy   The Heather Mckenzie had DOXOrubicin (ADRIAMYCIN) chemo injection 128 mg, 60 mg/m2 = 128 mg, Intravenous,  Once, 1 of 4 cycles Administration: 128 mg (05/12/2019) palonosetron (ALOXI) injection 0.25 mg, 0.25 mg, Intravenous,  Once, 1 of 8 cycles Administration: 0.25 mg (05/12/2019) pegfilgrastim-cbqv (UDENYCA) injection 6 mg, 6 mg, Subcutaneous, Once, 1 of 4 cycles Administration: 6 mg (05/14/2019) CARBOplatin (PARAPLATIN) 700 mg in sodium chloride 0.9 % 250 mL chemo infusion, 700 mg (100 % of original dose 700 mg), Intravenous,  Once, 0 of 4 cycles Dose modification: 700 mg (original dose 700  mg, Cycle 5) cyclophosphamide (CYTOXAN) 1,280 mg in sodium chloride 0.9 % 250 mL chemo infusion, 600 mg/m2 = 1,280 mg, Intravenous,  Once, 1 of 4 cycles Administration: 1,280 mg (05/12/2019) PACLitaxel (TAXOL) 174 mg in sodium chloride 0.9 % 250 mL chemo infusion (</= 895mm2), 80 mg/m2 = 174 mg, Intravenous,  Once, 0 of 4 cycles fosaprepitant (EMEND) 150 mg in sodium chloride 0.9 % 145 mL IVPB, 150 mg, Intravenous,  Once, 1 of 8 cycles Administration: 150 mg (05/12/2019) pembrolizumab (KEYTRUDA) 200 mg in sodium chloride 0.9 % 50 mL chemo infusion, 2 mg/kg = 200 mg, Intravenous, Once, 1 of 8 cycles Administration: 200 mg (05/12/2019)  for chemotherapy treatment.     Genetic Testing   Negative genetic testing. No pathogenic variants identified. VUS in HOXB13 called c.473C>A, VUS in POLD1 called c.1610G>C, and VUS in RAD50 called c.1094G>A identified on the [facility] Common Hereditary Cancers Panel. The report date is 05/10/2019.  The Common Hereditary Cancers Panel offered by [facility] includes sequencing and/or deletion duplication testing of the following 48 genes: APC, ATM, AXIN2, BARD1, BMPR1A, BRCA1, BRCA2, BRIP1, CDH1, CDKN2A (p14ARF), CDKN2A (p16INK4a), CKD4, CHEK2, CTNNA1, DICER1, EPCAM (Deletion/duplication testing only), GREM1 (promoter region deletion/duplication testing only), KIT, MEN1, MLH1, MSH2, MSH3, MSH6, MUTYH, NBN, NF1, NHTL1, PALB2, PDGFRA, PMS2, POLD1, POLE, PTEN, RAD50, RAD51C, RAD51D, RNF43, SDHB, SDHC, SDHD, SMAD4, SMARCA4. STK11, TP53, TSC1, TSC2, and VHL.  The following genes were evaluated for sequence changes only: SDHA and HOXB13 c.251G>A variant only.     CHIEF COMPLIANT: Cycle 1 Day 8 Adriamycin and Cytoxan  INTERVAL HISTORY: CrLonia Blood  is a 39 y.o. 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 She is currently on neoadjuvant chemotherapy with dose dense Adriamycin and Cytoxan. Breast MRI on 05/13/19 showed the biopsy proven malignancy in the left  breast with numerous suspicious enhancing masses in the superior and lower outer quadrants, with greater then 12 abnormal left axillary lymph nodes. She presents to the clinic today for a toxicity check following cycle 1.  Her major complaint today is mild nausea as well as soreness in the back of the throat and she has sensitivity for hot and cold liquids.  ALLERGIES:  has No Known Allergies.  MEDICATIONS:  Current Outpatient Medications  Medication Sig Dispense Refill  . ibuprofen (ADVIL,MOTRIN) 600 MG tablet Take 1 tablet (600 mg total) by mouth every 6 (six) hours as needed for pain. 30 tablet 2  . levonorgestrel-ethinyl estradiol (AMETHYST) 90-20 MCG tablet Take 1 tablet by mouth daily. (28) 90 mcg-20 mcg tablet    . lidocaine-prilocaine (EMLA) cream Apply to affected area once 30 g 3  . LORazepam (ATIVAN) 0.5 MG tablet Take 1 tablet (0.5 mg total) by mouth at bedtime as needed for sleep. 30 tablet 0  . ondansetron (ZOFRAN) 8 MG tablet Take 1 tablet (8 mg total) by mouth 2 (two) times daily as needed. Start on the third day after chemotherapy. 30 tablet 1  . oxyCODONE (OXY IR/ROXICODONE) 5 MG immediate release tablet Take 1 tablet (5 mg total) by mouth every 6 (six) hours as needed for severe pain. 10 tablet 0  . prochlorperazine (COMPAZINE) 10 MG tablet Take 1 tablet (10 mg total) by mouth every 6 (six) hours as needed (Nausea or vomiting). 30 tablet 1   No current facility-administered medications for this visit.    PHYSICAL EXAMINATION: ECOG PERFORMANCE STATUS: 1 - Symptomatic but completely ambulatory  Vitals:   05/19/19 1020  BP: 131/79  Pulse: 92  Resp: 17  Temp: 97.8 F (36.6 C)  SpO2: 100%   Filed Weights   05/19/19 1020  Weight: 221 lb 4.8 oz (100.4 kg)    LABORATORY DATA:  I have reviewed the data as listed CMP Latest Ref Rng & Units 05/12/2019 04/27/2019 02/24/2011  Glucose 70 - 99 mg/dL 92 87 104(H)  BUN 6 - 20 mg/dL _0 Creatinine 0.44 - 1.00 mg/dL 0.72  0.71 0.57  Sodium 135 - 145 mmol/L 140 138 133(L)  Potassium 3.5 - 5.1 mmol/L 3.7 4.0 4.0  Chloride 98 - 111 mmol/L 105 103 100  CO2 22 - 32 mmol/L _1 Calcium 8.9 - 10.3 mg/dL 8.8(L) 8.8(L) 9.3  Total Protein 6.5 - 8.1 g/dL 7.1 7.4 6.0  Total Bilirubin 0.3 - 1.2 mg/dL 0.4 0.4 0.1(L)  Alkaline Phos 38 - 126 U/L 85 111 262(H)  AST 15 - 41 U/L _2 ALT 0 - 44 U/L _3 Lab Results  Component Value Date   WBC 10.3 05/12/2019   HGB 12.2 05/12/2019   HCT 37.9 05/12/2019   MCV 85.0 05/12/2019   PLT 374 05/12/2019   NEUTROABS 6.0 05/12/2019    ASSESSMENT & PLAN:  Malignant neoplasm of overlapping sites of left breast in female, estrogen receptor negative ([place]) 04/25/2019:Heather Mckenzie palpated a left breast and axillary mass x11month. [place] the left breast showed a 3.6cm mass at the 1 o'clock position with 6 smaller adjacent masses extending toward the nipple ranging in size from 1.1cm to 4.0cm. [place] the left axilla  showed five bulky lymph nodes, the largest measuring 4.2cm, and second largest measuring 2.8cm. 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 T1c N2 stage IIIc  Treatment plan: 1. Neoadjuvant chemotherapy with Adriamycin and Cytoxanevery 3 weeks4 followed byTaxolweekly 12with carboplatin every 3 weeks and based on keynote 522 clinical trial we will add pembrolizumab every 3 weeks(8 cycles every 3 weeks) After she finishes neoadjuvant chemo appearing sternal lesions.  Go on pembrolizumab maintenance every 3 weeks for 9 more cycles 2. Followed bymastectomy andaxillary lymph nodedissection 3. Followed by adjuvant radiation therapy ----------------------------------------------------------------------------------------------------------------------------------------------------- Current treatment: Cycle 1 day 8 Adriamycin and Cytoxan with pembrolizumab Chemo toxicities: 1.  Mild nausea well controlled with nausea  drugs 2.  Thrush: I gave her a prescription for nystatin swish and swallow. 3.  Mild leukopenia: Expected for day 8 of chemotherapy.  She tolerated the Udenyca very well without any bone pain.  Bone scan: Sternal lesions CT chest abdomen pelvis was reviewed: It showed that the 2 sternal lesions do not have any CT correlate's of significance.  A PET CT scan was recommended. Liver showed a vague abnormality for which we will obtain a liver MRI.  Return to clinic in 2 weeks for cycle 2 MRI liver and PET/CT scan have been ordered.    No orders of the defined types were placed in this encounter.  The Heather Mckenzie has a good understanding of the overall plan. she agrees with it. she will call with any problems that may develop before the next visit here.  Total time spent: 30 mins including face to face time and time spent for planning, charting and coordination of care  Gudena, Vinay, MD 05/19/2019  I, Molly Dorshimer, am acting as scribe for Dr. Vinay Gudena.  I have reviewed the above documentation for accuracy and completeness, and I agree with the above.       

## 2019-05-19 ENCOUNTER — Inpatient Hospital Stay: Payer: 59 | Attending: Hematology and Oncology | Admitting: Hematology and Oncology

## 2019-05-19 ENCOUNTER — Other Ambulatory Visit: Payer: Self-pay

## 2019-05-19 ENCOUNTER — Telehealth: Payer: Self-pay | Admitting: *Deleted

## 2019-05-19 ENCOUNTER — Inpatient Hospital Stay: Payer: 59

## 2019-05-19 DIAGNOSIS — Z5189 Encounter for other specified aftercare: Secondary | ICD-10-CM | POA: Insufficient documentation

## 2019-05-19 DIAGNOSIS — Z171 Estrogen receptor negative status [ER-]: Secondary | ICD-10-CM

## 2019-05-19 DIAGNOSIS — Z79899 Other long term (current) drug therapy: Secondary | ICD-10-CM | POA: Diagnosis not present

## 2019-05-19 DIAGNOSIS — C50812 Malignant neoplasm of overlapping sites of left female breast: Secondary | ICD-10-CM | POA: Insufficient documentation

## 2019-05-19 DIAGNOSIS — Z5111 Encounter for antineoplastic chemotherapy: Secondary | ICD-10-CM | POA: Diagnosis present

## 2019-05-19 DIAGNOSIS — Z452 Encounter for adjustment and management of vascular access device: Secondary | ICD-10-CM | POA: Insufficient documentation

## 2019-05-19 DIAGNOSIS — R110 Nausea: Secondary | ICD-10-CM | POA: Diagnosis not present

## 2019-05-19 DIAGNOSIS — Z5112 Encounter for antineoplastic immunotherapy: Secondary | ICD-10-CM | POA: Diagnosis not present

## 2019-05-19 DIAGNOSIS — Z95828 Presence of other vascular implants and grafts: Secondary | ICD-10-CM

## 2019-05-19 LAB — CMP (CANCER CENTER ONLY)
ALT: 13 U/L (ref 0–44)
AST: 12 U/L — ABNORMAL LOW (ref 15–41)
Albumin: 3.6 g/dL (ref 3.5–5.0)
Alkaline Phosphatase: 116 U/L (ref 38–126)
Anion gap: 7 (ref 5–15)
BUN: 12 mg/dL (ref 6–20)
CO2: 26 mmol/L (ref 22–32)
Calcium: 9.3 mg/dL (ref 8.9–10.3)
Chloride: 103 mmol/L (ref 98–111)
Creatinine: 0.56 mg/dL (ref 0.44–1.00)
GFR, Est AFR Am: >60 mL/min (ref >60)
GFR, Estimated: >60 mL/min (ref >60)
Glucose, Bld: 96 mg/dL (ref 70–99)
Potassium: 4.4 mmol/L (ref 3.5–5.1)
Sodium: 136 mmol/L (ref 135–145)
Total Bilirubin: 0.4 mg/dL (ref 0.3–1.2)
Total Protein: 7.3 g/dL (ref 6.5–8.1)

## 2019-05-19 LAB — CBC WITH DIFFERENTIAL (CANCER CENTER ONLY)
Abs Immature Granulocytes: 0 10*3/uL (ref 0.00–0.07)
Basophils Absolute: 0 10*3/uL (ref 0.0–0.1)
Basophils Relative: 1 %
Eosinophils Absolute: 0.2 10*3/uL (ref 0.0–0.5)
Eosinophils Relative: 10 %
HCT: 38.6 % (ref 36.0–46.0)
Hemoglobin: 12.5 g/dL (ref 12.0–15.0)
Lymphocytes Relative: 56 %
Lymphs Abs: 1 10*3/uL (ref 0.7–4.0)
MCH: 27.4 pg (ref 26.0–34.0)
MCHC: 32.4 g/dL (ref 30.0–36.0)
MCV: 84.5 fL (ref 80.0–100.0)
Monocytes Absolute: 0.1 10*3/uL (ref 0.1–1.0)
Monocytes Relative: 5 %
Neutro Abs: 0.5 10*3/uL — ABNORMAL LOW (ref 1.7–7.7)
Neutrophils Relative %: 28 %
Platelet Count: 194 10*3/uL (ref 150–400)
RBC: 4.57 MIL/uL (ref 3.87–5.11)
RDW: 12.8 % (ref 11.5–15.5)
WBC Count: 1.8 10*3/uL — ABNORMAL LOW (ref 4.0–10.5)
nRBC: 0 % (ref 0.0–0.2)

## 2019-05-19 MED ORDER — SODIUM CHLORIDE 0.9% FLUSH
10.0000 mL | Freq: Once | INTRAVENOUS | Status: AC
  Administered 2019-05-19: 10 mL
  Filled 2019-05-19: qty 10

## 2019-05-19 MED ORDER — NYSTATIN 100000 UNIT/ML MT SUSP: 5.0000 mL | Freq: Three times a day (TID) | OROMUCOSAL | 1 refills | Status: DC

## 2019-05-19 MED ORDER — HEPARIN SOD (PORK) LOCK FLUSH 100 UNIT/ML IV SOLN
500.0000 [IU] | Freq: Once | INTRAVENOUS | Status: AC
  Administered 2019-05-19: 500 [IU]
  Filled 2019-05-19: qty 5

## 2019-05-19 NOTE — Patient Instructions (Signed)

## 2019-05-19 NOTE — Telephone Encounter (Signed)
Sent secure email to pt informing of PET appt with instructions. Contact information provided for questions or needs.

## 2019-05-19 NOTE — Assessment & Plan Note (Signed)
04/25/2019:Patient palpated a left breast and axillary mass x3months. US of the left breast showed a 3.6cm mass at the 1 o'clock position with 6 smaller adjacent masses extending toward the nipple ranging in size from 1.1cm to 4.0cm. US of the left axilla showed five bulky lymph nodes, the largest measuring 4.2cm, and second largest measuring 2.8cm. 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 T1c N2 stage IIIc  Treatment plan: 1. Neoadjuvant chemotherapy with Adriamycin and Cytoxanevery 3 weeks4 followed byTaxolweekly 12with carboplatin every 3 weeks and based on keynote 522 clinical trial we will add pembrolizumab every 3 weeks(8 cycles every 3 weeks) After she finishes neoadjuvant chemo appearing sternal lesions.  Go on pembrolizumab maintenance every 3 weeks for 9 more cycles 2. Followed bymastectomy andaxillary lymph nodedissection 3. Followed by adjuvant radiation therapy ----------------------------------------------------------------------------------------------------------------------------------------------------- Current treatment: Cycle 1 day 8 Adriamycin and Cytoxan with pembrolizumab Chemo toxicities:  Bone scan: Sternal lesions CT chest abdomen pelvis was reviewed: It showed that the 2 sternal lesions do not have any CT correlate's of significance.  A PET CT scan was recommended. Liver showed a vague abnormality for which we will obtain a liver MRI.  Return to clinic in 2 weeks for cycle 2 MRI liver and PET/CT scan have been ordered. 

## 2019-05-20 ENCOUNTER — Other Ambulatory Visit: Payer: Self-pay | Admitting: *Deleted

## 2019-05-20 LAB — THYROID PANEL WITH TSH
Free Thyroxine Index: 2.2 (ref 1.2–4.9)
T3 Uptake Ratio: 23 % — ABNORMAL LOW (ref 24–39)
T4, Total: 9.5 ug/dL (ref 4.5–12.0)
TSH: 0.884 u[IU]/mL (ref 0.450–4.500)

## 2019-05-20 MED ORDER — FLUCONAZOLE 100 MG PO TABS: 100.0000 mg | ORAL_TABLET | Freq: Every day | ORAL | 0 refills | Status: DC

## 2019-05-20 NOTE — Progress Notes (Signed)
Received call from pt stating the nystatin rinse MD prescribed upsets her stomach and makes her vomit.  Per MD pt to be prescribed Diflucan 100 mg tablet daily x7 days.  Pt notified and verbalized understanding.

## 2019-05-27 ENCOUNTER — Other Ambulatory Visit: Payer: Self-pay

## 2019-05-27 ENCOUNTER — Encounter (HOSPITAL_COMMUNITY)
Admission: RE | Admit: 2019-05-27 | Discharge: 2019-05-27 | Disposition: A | Discharge status: Home / Self Care | Payer: 59 | Source: Ambulatory Visit | Attending: Hematology and Oncology | Admitting: Hematology and Oncology

## 2019-05-27 DIAGNOSIS — Z171 Estrogen receptor negative status [ER-]: Secondary | ICD-10-CM | POA: Diagnosis present

## 2019-05-27 DIAGNOSIS — C50812 Malignant neoplasm of overlapping sites of left female breast: Secondary | ICD-10-CM | POA: Insufficient documentation

## 2019-05-27 LAB — GLUCOSE, CAPILLARY: Glucose-Capillary: 81 mg/dL (ref 70–99)

## 2019-05-27 MED ORDER — FLUDEOXYGLUCOSE F - 18 (FDG) INJECTION
10.9600 | Freq: Once | INTRAVENOUS | Status: AC
  Administered 2019-05-27: 16:00:00 10.96 via INTRAVENOUS

## 2019-05-30 ENCOUNTER — Encounter: Payer: Self-pay | Admitting: *Deleted

## 2019-06-01 ENCOUNTER — Encounter: Payer: Self-pay | Admitting: *Deleted

## 2019-06-01 NOTE — Assessment & Plan Note (Deleted)
04/25/2019:Patient palpated a left breast and axillary mass x3months. US of the left breast showed a 3.6cm mass at the 1 o'clock position with 6 smaller adjacent masses extending toward the nipple ranging in size from 1.1cm to 4.0cm. US of the left axilla showed five bulky lymph nodes, the largest measuring 4.2cm, and second largest measuring 2.8cm. 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 T1c N2 stage IIIc  Treatment plan: 1. Neoadjuvant chemotherapy with Adriamycin and Cytoxanevery 3 weeks4 followed byTaxolweekly 12with carboplatin every 3 weeks and based on keynote 522 clinical trial we will add pembrolizumab every 3 weeks(8 cycles every 3 weeks) After she finishes neoadjuvant chemoappearing sternal lesions. Go on pembrolizumab maintenance every 3 weeks for 9 more cycles 2. Followed bymastectomy andaxillary lymph nodedissection 3. Followed by adjuvant radiation therapy ----------------------------------------------------------------------------------------------------------------------------------------------------- Current treatment: Cycle 2 Adriamycin and Cytoxan with pembrolizumab Chemo toxicities: 1.  Mild nausea well controlled with nausea drugs 2.  Thrush: I gave her a prescription for nystatin swish and swallow. 3.  Mild leukopenia: Expected for day 8 of chemotherapy.  She tolerated the Udenyca very well without any bone pain.  Bone scan: Sternal lesions CT chest abdomen pelvis was reviewed: It showed that the 2 sternal lesions do not have any CT correlate's of significance.  Breast MRI 05/16/2019: Biopsy-proven malignancy left breast, numerous suspicious enhancing masses or nodules, 12+ morphologically abnormal lymph nodes. PET CT scan: 05/27/2019: Hypermetabolic nodules in the left breast with hypermetabolic left axillary and subpectoral adenopathy.  Bilateral lymph nodes in the neck, thyroid activity for thyroiditis, no distant  metastatic disease.  I discussed that the results of the PET CT scan indicate cervical lymphadenopathy which is concerning for metastatic disease.  I would like to obtain a biopsy of the cervical lymph node to confirm this.  Liver showed a vague abnormality for which we will obtain a liver MRI.  This will be done on 06/09/2019  Return to clinic in 3 weeks for cycle 3 MRI liver and PET/CT scan have been ordered. 

## 2019-06-02 ENCOUNTER — Other Ambulatory Visit: Payer: Self-pay

## 2019-06-02 ENCOUNTER — Ambulatory Visit: Payer: Self-pay | Admitting: Hematology and Oncology

## 2019-06-02 ENCOUNTER — Ambulatory Visit: Payer: Self-pay

## 2019-06-02 NOTE — Progress Notes (Signed)
Patient Care Team: Patient, No Pcp Per as PCP - General (General Practice) [name], RN as Oncology Nurse Navigator [name], RN as Oncology Nurse Navigator [name], MD as Consulting Physician (General Surgery) [name], MD as Consulting Physician (Hematology and Oncology) [name], MD as Consulting Physician (Radiation Oncology)  DIAGNOSIS:    ICD-10-CM   1. Malignant neoplasm of overlapping sites of left breast in female, estrogen receptor negative ([place])  C50.812    Z17.1     SUMMARY OF ONCOLOGIC HISTORY: Oncology History  Malignant neoplasm of overlapping sites of left breast in female, estrogen receptor negative ([place])  04/25/2019 Initial Diagnosis   Patient palpated a left breast and axillary mass x63month. [place] left breast showed a 3.6cm mass at the 1 o'clock position with 6 smaller adjacent masses extending toward the nipple ranging in size from 1.1cm to 4.0cm. [place] the left axilla showed five bulky lymph nodes, the largest measuring 4.2cm, and second largest measuring 2.8cm. 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    04/27/2019 Cancer Staging   Staging form: Breast, AJCC 8th Edition - Clinical: Stage IIIC (cT1c, cN2a, cM0, G3, ER-, PR-, HER2-) - Signed by GNicholas Lose MD on 04/27/2019   05/12/2019 -  Chemotherapy   The patient had DOXOrubicin (ADRIAMYCIN) chemo injection 128 mg, 60 mg/m2 = 128 mg, Intravenous,  Once, 1 of 4 cycles Administration: 128 mg (05/12/2019) palonosetron (ALOXI) injection 0.25 mg, 0.25 mg, Intravenous,  Once, 1 of 8 cycles Administration: 0.25 mg (05/12/2019) pegfilgrastim-cbqv (UDENYCA) injection 6 mg, 6 mg, Subcutaneous, Once, 1 of 4 cycles Administration: 6 mg (05/14/2019) CARBOplatin (PARAPLATIN) 700 mg in sodium chloride 0.9 % 250 mL chemo infusion, 700 mg (100 % of original dose 700 mg), Intravenous,  Once, 0 of 4 cycles Dose modification: 700 mg (original dose 700  mg, Cycle 5) cyclophosphamide (CYTOXAN) 1,280 mg in sodium chloride 0.9 % 250 mL chemo infusion, 600 mg/m2 = 1,280 mg, Intravenous,  Once, 1 of 4 cycles Administration: 1,280 mg (05/12/2019) PACLitaxel (TAXOL) 174 mg in sodium chloride 0.9 % 250 mL chemo infusion (</= 823mm2), 80 mg/m2 = 174 mg, Intravenous,  Once, 0 of 4 cycles fosaprepitant (EMEND) 150 mg in sodium chloride 0.9 % 145 mL IVPB, 150 mg, Intravenous,  Once, 1 of 8 cycles Administration: 150 mg (05/12/2019) pembrolizumab (KEYTRUDA) 200 mg in sodium chloride 0.9 % 50 mL chemo infusion, 2 mg/kg = 200 mg, Intravenous, Once, 1 of 8 cycles Administration: 200 mg (05/12/2019)  for chemotherapy treatment.     Genetic Testing   Negative genetic testing. No pathogenic variants identified. VUS in HOXB13 called c.473C>A, VUS in POLD1 called c.1610G>C, and VUS in RAD50 called c.1094G>A identified on the [facility] Common Hereditary Cancers Panel. The report date is 05/10/2019.  The Common Hereditary Cancers Panel offered by [facility] includes sequencing and/or deletion duplication testing of the following 48 genes: APC, ATM, AXIN2, BARD1, BMPR1A, BRCA1, BRCA2, BRIP1, CDH1, CDKN2A (p14ARF), CDKN2A (p16INK4a), CKD4, CHEK2, CTNNA1, DICER1, EPCAM (Deletion/duplication testing only), GREM1 (promoter region deletion/duplication testing only), KIT, MEN1, MLH1, MSH2, MSH3, MSH6, MUTYH, NBN, NF1, NHTL1, PALB2, PDGFRA, PMS2, POLD1, POLE, PTEN, RAD50, RAD51C, RAD51D, RNF43, SDHB, SDHC, SDHD, SMAD4, SMARCA4. STK11, TP53, TSC1, TSC2, and VHL.  The following genes were evaluated for sequence changes only: SDHA and HOXB13 c.251G>A variant only.     CHIEF COMPLIANT: Cycle 2 Adriamycin and Cytoxan  INTERVAL HISTORY: Heather Mckenzie a 39 y.o. 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 She is currently on neoadjuvant chemotherapy with dose dense Adriamycin and Cytoxan. PET scan on 05/27/19 showed the known left breast malignancy and hypermetabolic  bilateral lymph nodes in the neck likely reflecting malignant involvement. She presents to the clinic today for cycle 2.   She has been feeling much better.  She denies any nausea or vomiting.  Denies any diarrhea or constipation.  Denies any rashes. She had a PET CT scan which showed cervical lymphadenopathy.  There was no activity in the sternal lesion.  Ovarian adnexal lesion also did not have any activity.   ALLERGIES:  has No Known Allergies.  MEDICATIONS:  Current Outpatient Medications  Medication Sig Dispense Refill  . fluconazole (DIFLUCAN) 100 MG tablet Take 1 tablet (100 mg total) by mouth daily. 7 tablet 0  . ibuprofen (ADVIL,MOTRIN) 600 MG tablet Take 1 tablet (600 mg total) by mouth every 6 (six) hours as needed for pain. 30 tablet 2  . levonorgestrel-ethinyl estradiol (AMETHYST) 90-20 MCG tablet Take 1 tablet by mouth daily. (28) 90 mcg-20 mcg tablet    . lidocaine-prilocaine (EMLA) cream Apply to affected area once 30 g 3  . LORazepam (ATIVAN) 0.5 MG tablet Take 1 tablet (0.5 mg total) by mouth at bedtime as needed for sleep. 30 tablet 0  . nystatin (MYCOSTATIN) 100000 UNIT/ML suspension Take 5 mLs (500,000 Units total) by mouth 3 (three) times daily. 60 mL 1  . ondansetron (ZOFRAN) 8 MG tablet Take 1 tablet (8 mg total) by mouth 2 (two) times daily as needed. Start on the third day after chemotherapy. 30 tablet 1  . prochlorperazine (COMPAZINE) 10 MG tablet Take 1 tablet (10 mg total) by mouth every 6 (six) hours as needed (Nausea or vomiting). 30 tablet 1   No current facility-administered medications for this visit.    PHYSICAL EXAMINATION: ECOG PERFORMANCE STATUS: 1 - Symptomatic but completely ambulatory  Vitals:   06/03/19 1204  BP: 119/69  Pulse: 100  Resp: 18  Temp: 98 F (36.7 C)  SpO2: 99%   Filed Weights   06/03/19 1204  Weight: 219 lb 14.4 oz (99.7 kg)    LABORATORY DATA:  I have reviewed the data as listed CMP Latest Ref Rng & Units 06/03/2019  05/19/2019 05/12/2019  Glucose 70 - 99 mg/dL 99 96 92  BUN 6 - 20 mg/dL '11 12 7  ' Creatinine 0.44 - 1.00 mg/dL 0.62 0.56 0.72  Sodium 135 - 145 mmol/L 140 136 140  Potassium 3.5 - 5.1 mmol/L 4.4 4.4 3.7  Chloride 98 - 111 mmol/L 106 103 105  CO2 22 - 32 mmol/L '24 26 25  ' Calcium 8.9 - 10.3 mg/dL 9.2 9.3 8.8(L)  Total Protein 6.5 - 8.1 g/dL 7.1 7.3 7.1  Total Bilirubin 0.3 - 1.2 mg/dL 0.3 0.4 0.4  Alkaline Phos 38 - 126 U/L 110 116 85  AST 15 - 41 U/L 23 12(L) 15  ALT 0 - 44 U/L 34 13 16    Lab Results  Component Value Date   WBC 8.6 06/03/2019   HGB 11.3 (L) 06/03/2019   HCT 35.0 (L) 06/03/2019   MCV 85.6 06/03/2019   PLT 508 (H) 06/03/2019   NEUTROABS 5.6 06/03/2019    ASSESSMENT & PLAN:  Malignant neoplasm of overlapping sites of left breast in female, estrogen receptor negative ([place]) 04/25/2019:Patient palpated a left breast and axillary mass x54month. [place] left breast showed a 3.6cm mass at the  1 o'clock position with 6 smaller adjacent masses extending toward the nipple ranging in size from 1.1cm to 4.0cm. US of the left axilla showed five bulky lymph nodes, the largest measuring 4.2cm, and second largest measuring 2.8cm. 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 T1c N2 stage IIIc Based on PET CT scan showing bilateral cervical nodes, it is stage IV (however her goal is to cure given the oligometastatic nature of her cancer.)  Treatment plan: 1. Neoadjuvant chemotherapy with Adriamycin and Cytoxanevery 3 weeks4 followed byTaxolweekly 12with carboplatin every 3 weeks and based on keynote 522 clinical trial we will add pembrolizumab every 3 weeks(8 cycles every 3 weeks) After she finishes neoadjuvant chemoappearing sternal lesions. Go on pembrolizumab maintenance every 3 weeks for 9 more cycles 2. Followed bymastectomy andaxillary lymph nodedissection 3. Followed by adjuvant radiation  therapy ----------------------------------------------------------------------------------------------------------------------------------------------------- Current treatment: Cycle 2 Adriamycin and Cytoxan with pembrolizumab  Chemo toxicities: 1.  Nausea: Mild 2.  Oral thrush: Resolved Monitoring closely for toxicities including immune mediated adverse effects. Monitoring closely for thyroid toxicities: PET/CT showed a thyroid inflammation.  TSH on 05/19/2019: 0.88  PET CT scan 7/00/4849: Hypermetabolic nodules in the left breast and left axilla and subpectoral adenopathy.  Small hypermetabolic lymph nodes in the neck.  Marrow activity from G-CSF, thyroid activity possibly from immunotherapy.   MRI of the liver has been ordered. Radiology review: I am concerned that the lymphadenopathy in the neck is worrisome for metastatic disease.  We will try to obtain a biopsy.  Return to clinic in 3 weeks for cycle 3    No orders of the defined types were placed in this encounter.  The patient has a good understanding of the overall plan. she agrees with it. she will call with any problems that may develop before the next visit here.  Total time spent: 30 mins including face to face time and time spent for planning, charting and coordination of care  [name], MD 06/03/2019  I, Heather Mckenzie, am acting as scribe for Dr. [name].  I have reviewed the above documentation for accuracy and completeness, and I agree with the above.

## 2019-06-03 ENCOUNTER — Inpatient Hospital Stay (HOSPITAL_BASED_OUTPATIENT_CLINIC_OR_DEPARTMENT_OTHER): Payer: 59 | Admitting: Hematology and Oncology

## 2019-06-03 ENCOUNTER — Ambulatory Visit: Payer: Self-pay | Admitting: Hematology and Oncology

## 2019-06-03 ENCOUNTER — Other Ambulatory Visit: Payer: Self-pay

## 2019-06-03 ENCOUNTER — Inpatient Hospital Stay: Payer: 59

## 2019-06-03 DIAGNOSIS — C50812 Malignant neoplasm of overlapping sites of left female breast: Secondary | ICD-10-CM

## 2019-06-03 DIAGNOSIS — Z5112 Encounter for antineoplastic immunotherapy: Secondary | ICD-10-CM | POA: Diagnosis not present

## 2019-06-03 DIAGNOSIS — Z171 Estrogen receptor negative status [ER-]: Secondary | ICD-10-CM

## 2019-06-03 LAB — CMP (CANCER CENTER ONLY)
ALT: 34 U/L (ref 0–44)
AST: 23 U/L (ref 15–41)
Albumin: 3.3 g/dL — ABNORMAL LOW (ref 3.5–5.0)
Alkaline Phosphatase: 110 U/L (ref 38–126)
Anion gap: 10 (ref 5–15)
BUN: 11 mg/dL (ref 6–20)
CO2: 24 mmol/L (ref 22–32)
Calcium: 9.2 mg/dL (ref 8.9–10.3)
Chloride: 106 mmol/L (ref 98–111)
Creatinine: 0.62 mg/dL (ref 0.44–1.00)
GFR, Est AFR Am: >60 mL/min (ref >60)
GFR, Estimated: >60 mL/min (ref >60)
Glucose, Bld: 99 mg/dL (ref 70–99)
Potassium: 4.4 mmol/L (ref 3.5–5.1)
Sodium: 140 mmol/L (ref 135–145)
Total Bilirubin: 0.3 mg/dL (ref 0.3–1.2)
Total Protein: 7.1 g/dL (ref 6.5–8.1)

## 2019-06-03 LAB — CBC WITH DIFFERENTIAL (CANCER CENTER ONLY)
Abs Immature Granulocytes: 0.06 10*3/uL (ref 0.00–0.07)
Basophils Absolute: 0.1 10*3/uL (ref 0.0–0.1)
Basophils Relative: 1 %
Eosinophils Absolute: 0 10*3/uL (ref 0.0–0.5)
Eosinophils Relative: 0 %
HCT: 35 % — ABNORMAL LOW (ref 36.0–46.0)
Hemoglobin: 11.3 g/dL — ABNORMAL LOW (ref 12.0–15.0)
Immature Granulocytes: 1 %
Lymphocytes Relative: 19 %
Lymphs Abs: 1.6 10*3/uL (ref 0.7–4.0)
MCH: 27.6 pg (ref 26.0–34.0)
MCHC: 32.3 g/dL (ref 30.0–36.0)
MCV: 85.6 fL (ref 80.0–100.0)
Monocytes Absolute: 1.2 10*3/uL — ABNORMAL HIGH (ref 0.1–1.0)
Monocytes Relative: 14 %
Neutro Abs: 5.6 10*3/uL (ref 1.7–7.7)
Neutrophils Relative %: 65 %
Platelet Count: 508 10*3/uL — ABNORMAL HIGH (ref 150–400)
RBC: 4.09 MIL/uL (ref 3.87–5.11)
RDW: 13.7 % (ref 11.5–15.5)
WBC Count: 8.6 10*3/uL (ref 4.0–10.5)
nRBC: 0 % (ref 0.0–0.2)

## 2019-06-03 MED ORDER — DOXORUBICIN HCL CHEMO IV INJECTION 2 MG/ML
60.0000 mg/m2 | Freq: Once | INTRAVENOUS | Status: AC
  Administered 2019-06-03: 128 mg via INTRAVENOUS
  Filled 2019-06-03: qty 64

## 2019-06-03 MED ORDER — DEXAMETHASONE SODIUM PHOSPHATE 10 MG/ML IJ SOLN
10.0000 mg | Freq: Once | INTRAMUSCULAR | Status: AC
  Administered 2019-06-03: 10 mg via INTRAVENOUS

## 2019-06-03 MED ORDER — HEPARIN SOD (PORK) LOCK FLUSH 100 UNIT/ML IV SOLN
500.0000 [IU] | Freq: Once | INTRAVENOUS | Status: AC | PRN
  Administered 2019-06-03: 500 [IU]
  Filled 2019-06-03: qty 5

## 2019-06-03 MED ORDER — PALONOSETRON HCL INJECTION 0.25 MG/5ML
0.2500 mg | Freq: Once | INTRAVENOUS | Status: AC
  Administered 2019-06-03: 0.25 mg via INTRAVENOUS

## 2019-06-03 MED ORDER — DIPHENHYDRAMINE HCL 25 MG PO CAPS
ORAL_CAPSULE | ORAL | Status: AC
  Filled 2019-06-03: qty 1

## 2019-06-03 MED ORDER — SODIUM CHLORIDE 0.9 % IV SOLN
150.0000 mg | Freq: Once | INTRAVENOUS | Status: AC
  Administered 2019-06-03: 150 mg via INTRAVENOUS
  Filled 2019-06-03: qty 150

## 2019-06-03 MED ORDER — DEXAMETHASONE SODIUM PHOSPHATE 10 MG/ML IJ SOLN
INTRAMUSCULAR | Status: AC
  Filled 2019-06-03: qty 1

## 2019-06-03 MED ORDER — SODIUM CHLORIDE 0.9 % IV SOLN
Freq: Once | INTRAVENOUS | Status: AC
  Filled 2019-06-03: qty 250

## 2019-06-03 MED ORDER — SODIUM CHLORIDE 0.9 % IV SOLN
2.0000 mg/kg | Freq: Once | INTRAVENOUS | Status: AC
  Administered 2019-06-03: 200 mg via INTRAVENOUS
  Filled 2019-06-03: qty 8

## 2019-06-03 MED ORDER — ACETAMINOPHEN 325 MG PO TABS
ORAL_TABLET | ORAL | Status: AC
  Filled 2019-06-03: qty 2

## 2019-06-03 MED ORDER — SODIUM CHLORIDE 0.9 % IV SOLN
600.0000 mg/m2 | Freq: Once | INTRAVENOUS | Status: AC
  Administered 2019-06-03: 1280 mg via INTRAVENOUS
  Filled 2019-06-03: qty 64

## 2019-06-03 MED ORDER — PALONOSETRON HCL INJECTION 0.25 MG/5ML
INTRAVENOUS | Status: AC
  Filled 2019-06-03: qty 5

## 2019-06-03 MED ORDER — SODIUM CHLORIDE 0.9% FLUSH
10.0000 mL | INTRAVENOUS | Status: DC | PRN
  Administered 2019-06-03: 10 mL
  Filled 2019-06-03: qty 10

## 2019-06-03 NOTE — Patient Instructions (Signed)
[facility] Discharge Instructions for Patients Receiving Chemotherapy  Today you received the following chemotherapy agents: pembrolizumab, doxorubicin, and cyclophosphamide.  To help prevent nausea and vomiting after your treatment, we encourage you to take your nausea medication as directed.   If you develop nausea and vomiting that is not controlled by your nausea medication, call the clinic.   BELOW ARE SYMPTOMS THAT SHOULD BE REPORTED IMMEDIATELY:  *FEVER GREATER THAN 100.5 F  *CHILLS WITH OR WITHOUT FEVER  NAUSEA AND VOMITING THAT IS NOT CONTROLLED WITH YOUR NAUSEA MEDICATION  *UNUSUAL SHORTNESS OF BREATH  *UNUSUAL BRUISING OR BLEEDING  TENDERNESS IN MOUTH AND THROAT WITH OR WITHOUT PRESENCE OF ULCERS  *URINARY PROBLEMS  *BOWEL PROBLEMS  UNUSUAL RASH Items with * indicate a potential emergency and should be followed up as soon as possible.  Feel free to call the clinic should you have any questions or concerns. The clinic phone number is [phone].  Please show the CHEMO ALERT CARD at check-in to the Emergency Department and triage nurse.   

## 2019-06-03 NOTE — Assessment & Plan Note (Signed)
04/25/2019:Patient palpated a left breast and axillary mass x7month. [place] left breast showed a 3.6cm mass at the 1 o'clock position with 6 smaller adjacent masses extending toward the nipple ranging in size from 1.1cm to 4.0cm. [place] the left axilla showed five bulky lymph nodes, the largest measuring 4.2cm, and second largest measuring 2.8cm. 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 T1c N2 stage IIIc  Treatment plan: 1. Neoadjuvant chemotherapy with Adriamycin and Cytoxanevery 3 weeks4 followed byTaxolweekly 12with carboplatin every 3 weeks and based on keynote 522 clinical trial we will add pembrolizumab every 3 weeks(8 cycles every 3 weeks) After she finishes neoadjuvant chemoappearing sternal lesions. Go on pembrolizumab maintenance every 3 weeks for 9 more cycles 2. Followed bymastectomy andaxillary lymph nodedissection 3. Followed by adjuvant radiation therapy ----------------------------------------------------------------------------------------------------------------------------------------------------- Current treatment: Cycle 2 Adriamycin and Cytoxan with pembrolizumab  Chemo toxicities: 1.  Nausea: Mild 2.  Oral thrush: Resolved Monitoring closely for toxicities including immune mediated adverse effects.  PET CT scan 26/77/3736 Hypermetabolic nodules in the left breast and left axilla and subpectoral adenopathy.  Small hypermetabolic lymph nodes in the neck.  Marrow activity from G-CSF, thyroid activity possibly from immunotherapy.  MRI of the liver has been ordered. Radiology review: I am concerned that the lymphadenopathy in the neck is worrisome for metastatic disease.  We will try to obtain a biopsy.  Return to clinic in 3 weeks for cycle 3

## 2019-06-04 ENCOUNTER — Ambulatory Visit: Payer: Self-pay

## 2019-06-04 ENCOUNTER — Inpatient Hospital Stay: Payer: 59

## 2019-06-06 ENCOUNTER — Inpatient Hospital Stay: Payer: 59

## 2019-06-06 ENCOUNTER — Other Ambulatory Visit: Payer: Self-pay

## 2019-06-06 VITALS — BP 118/72 | HR 88 | Temp 98.2°F | Resp 18

## 2019-06-06 DIAGNOSIS — Z5112 Encounter for antineoplastic immunotherapy: Secondary | ICD-10-CM | POA: Diagnosis not present

## 2019-06-06 DIAGNOSIS — Z171 Estrogen receptor negative status [ER-]: Secondary | ICD-10-CM

## 2019-06-06 DIAGNOSIS — C50812 Malignant neoplasm of overlapping sites of left female breast: Secondary | ICD-10-CM

## 2019-06-06 MED ORDER — PEGFILGRASTIM-CBQV 6 MG/0.6ML ~~LOC~~ SOSY
PREFILLED_SYRINGE | SUBCUTANEOUS | Status: AC
  Filled 2019-06-06: qty 0.6

## 2019-06-06 MED ORDER — PEGFILGRASTIM-CBQV 6 MG/0.6ML ~~LOC~~ SOSY
6.0000 mg | PREFILLED_SYRINGE | Freq: Once | SUBCUTANEOUS | Status: AC
  Administered 2019-06-06: 6 mg via SUBCUTANEOUS

## 2019-06-06 NOTE — Patient Instructions (Signed)

## 2019-06-09 ENCOUNTER — Other Ambulatory Visit: Payer: Self-pay | Admitting: *Deleted

## 2019-06-09 ENCOUNTER — Inpatient Hospital Stay: Admission: RE | Admit: 2019-06-09 | Payer: 59 | Source: Ambulatory Visit

## 2019-06-10 ENCOUNTER — Other Ambulatory Visit: Payer: Self-pay | Admitting: *Deleted

## 2019-06-10 ENCOUNTER — Encounter: Payer: Self-pay | Admitting: *Deleted

## 2019-06-10 DIAGNOSIS — C50812 Malignant neoplasm of overlapping sites of left female breast: Secondary | ICD-10-CM

## 2019-06-10 DIAGNOSIS — Z171 Estrogen receptor negative status [ER-]: Secondary | ICD-10-CM

## 2019-06-13 ENCOUNTER — Encounter (HOSPITAL_COMMUNITY): Payer: Self-pay

## 2019-06-13 ENCOUNTER — Telehealth: Payer: Self-pay | Admitting: *Deleted

## 2019-06-13 NOTE — Telephone Encounter (Signed)
Pt was no show for liver MR last week. Was able to get r/s to 3/2. Per GI, pt relates MR is not needed and did not schedule. Dr. Lindi Adie notified.

## 2019-06-13 NOTE — Progress Notes (Signed)
Jonnae Murai Female, 39 y.o., [birth year] MRN:  [id number] Phone:  [phone] Jerilynn Mages) PCP:  Patient, No Pcp Per Coverage:  [facility] 06/23/2019 at 8:45 AM  RE: Biopsy Received: Today Message Contents  Sandi Mariscal, MD  [facility]      US guided R Cx LN Bx - PET CT - image 29, series 4.   Sedation per pt request.   Cathren Harsh   Previous Messages  ----- Message -----  From: Lenore Cordia  Sent: 06/10/2019  2:00 PM EST  To: Ir Procedure Requests  Subject: Biopsy                      Procedure Requested: [place] Core Biopsy (Lymph nodes)    Reason for Procedure: new breast cancer- Pet shows bil enlarged LN in neck, suspicious of malignancy    Provider Requesting: [name]  Provider Telephone:  [phone]  Other Info: Rad Exams in Epic

## 2019-06-14 ENCOUNTER — Encounter: Payer: Self-pay | Admitting: *Deleted

## 2019-06-22 NOTE — Progress Notes (Signed)
Patient Care Team: Patient, No Pcp Per as PCP - General (General Practice) [name], RN as Oncology Nurse Navigator [name], RN as Oncology Nurse Navigator [name], MD as Consulting Physician (General Surgery) [name], MD as Consulting Physician (Hematology and Oncology) [name], MD as Consulting Physician (Radiation Oncology)  DIAGNOSIS:    ICD-10-CM   1. Malignant neoplasm of overlapping sites of left breast in female, estrogen receptor negative ([place])  C50.812    Z17.1     SUMMARY OF ONCOLOGIC HISTORY: Oncology History  Malignant neoplasm of overlapping sites of left breast in female, estrogen receptor negative ([place])  04/25/2019 Initial Diagnosis   Patient palpated a left breast and axillary mass x56month. [place] the left breast showed a 3.6cm mass at the 1 o'clock position with 6 smaller adjacent masses extending toward the nipple ranging in size from 1.1cm to 4.0cm. [place] the left axilla showed five bulky lymph nodes, the largest measuring 4.2cm, and second largest measuring 2.8cm. 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    04/27/2019 Cancer Staging   Staging form: Breast, AJCC 8th Edition - Clinical: Stage IIIC (cT1c, cN2a, cM0, G3, ER-, PR-, HER2-) - Signed by GNicholas Lose MD on 04/27/2019   05/12/2019 -  Chemotherapy   The patient had DOXOrubicin (ADRIAMYCIN) chemo injection 128 mg, 60 mg/m2 = 128 mg, Intravenous,  Once, 2 of 4 cycles Administration: 128 mg (05/12/2019), 128 mg (06/03/2019) palonosetron (ALOXI) injection 0.25 mg, 0.25 mg, Intravenous,  Once, 2 of 8 cycles Administration: 0.25 mg (05/12/2019), 0.25 mg (06/03/2019) pegfilgrastim-cbqv (UDENYCA) injection 6 mg, 6 mg, Subcutaneous, Once, 2 of 4 cycles Administration: 6 mg (05/14/2019) CARBOplatin (PARAPLATIN) 700 mg in sodium chloride 0.9 % 250 mL chemo infusion, 700 mg (100 % of original dose 700 mg), Intravenous,  Once, 0 of 4  cycles Dose modification: 700 mg (original dose 700 mg, Cycle 5) cyclophosphamide (CYTOXAN) 1,280 mg in sodium chloride 0.9 % 250 mL chemo infusion, 600 mg/m2 = 1,280 mg, Intravenous,  Once, 2 of 4 cycles Administration: 1,280 mg (05/12/2019), 1,280 mg (06/03/2019) PACLitaxel (TAXOL) 174 mg in sodium chloride 0.9 % 250 mL chemo infusion (</= 853mm2), 80 mg/m2 = 174 mg, Intravenous,  Once, 0 of 4 cycles fosaprepitant (EMEND) 150 mg in sodium chloride 0.9 % 145 mL IVPB, 150 mg, Intravenous,  Once, 2 of 8 cycles Administration: 150 mg (05/12/2019), 150 mg (06/03/2019) pembrolizumab (KEYTRUDA) 200 mg in sodium chloride 0.9 % 50 mL chemo infusion, 2 mg/kg = 200 mg, Intravenous, Once, 2 of 8 cycles Administration: 200 mg (05/12/2019), 200 mg (06/03/2019)  for chemotherapy treatment.     Genetic Testing   Negative genetic testing. No pathogenic variants identified. VUS in HOXB13 called c.473C>A, VUS in POLD1 called c.1610G>C, and VUS in RAD50 called c.1094G>A identified on the [employer] Common Hereditary Cancers Panel. The report date is 05/10/2019.  The Common Hereditary Cancers Panel offered by [facility] includes sequencing and/or deletion duplication testing of the following 48 genes: APC, ATM, AXIN2, BARD1, BMPR1A, BRCA1, BRCA2, BRIP1, CDH1, CDKN2A (p14ARF), CDKN2A (p16INK4a), CKD4, CHEK2, CTNNA1, DICER1, EPCAM (Deletion/duplication testing only), GREM1 (promoter region deletion/duplication testing only), KIT, MEN1, MLH1, MSH2, MSH3, MSH6, MUTYH, NBN, NF1, NHTL1, PALB2, PDGFRA, PMS2, POLD1, POLE, PTEN, RAD50, RAD51C, RAD51D, RNF43, SDHB, SDHC, SDHD, SMAD4, SMARCA4. STK11, TP53, TSC1, TSC2, and VHL.  The following genes were evaluated for sequence changes only: SDHA and HOXB13 c.251G>A variant only.  CHIEF COMPLIANT: Cycle 3 Adriamycin and Cytoxan  INTERVAL HISTORY: Heather Mckenzie is a 39 y.o. 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 She is currently on neoadjuvant chemotherapy with  dose dense Adriamycin and Cytoxan. She presents to the clinic todayfor cycle 3.   ALLERGIES:  has No Known Allergies.  MEDICATIONS:  Current Outpatient Medications  Medication Sig Dispense Refill  . fluconazole (DIFLUCAN) 100 MG tablet Take 1 tablet (100 mg total) by mouth daily. 7 tablet 0  . ibuprofen (ADVIL,MOTRIN) 600 MG tablet Take 1 tablet (600 mg total) by mouth every 6 (six) hours as needed for pain. 30 tablet 2  . levonorgestrel-ethinyl estradiol (AMETHYST) 90-20 MCG tablet Take 1 tablet by mouth daily. (28) 90 mcg-20 mcg tablet    . lidocaine-prilocaine (EMLA) cream Apply to affected area once 30 g 3  . LORazepam (ATIVAN) 0.5 MG tablet Take 1 tablet (0.5 mg total) by mouth at bedtime as needed for sleep. 30 tablet 0  . nystatin (MYCOSTATIN) 100000 UNIT/ML suspension Take 5 mLs (500,000 Units total) by mouth 3 (three) times daily. 60 mL 1  . ondansetron (ZOFRAN) 8 MG tablet Take 1 tablet (8 mg total) by mouth 2 (two) times daily as needed. Start on the third day after chemotherapy. 30 tablet 1  . prochlorperazine (COMPAZINE) 10 MG tablet Take 1 tablet (10 mg total) by mouth every 6 (six) hours as needed (Nausea or vomiting). 30 tablet 1   No current facility-administered medications for this visit.    PHYSICAL EXAMINATION: ECOG PERFORMANCE STATUS: 1 - Symptomatic but completely ambulatory  Vitals:   06/23/19 0929  BP: 122/74  Pulse: 93  Resp: 18  Temp: (!) 97.4 F (36.3 C)  SpO2: 100%   Filed Weights   06/23/19 0929  Weight: 222 lb 11.2 oz (101 kg)    LABORATORY DATA:  I have reviewed the data as listed CMP Latest Ref Rng & Units 06/23/2019 06/03/2019 05/19/2019  Glucose 70 - 99 mg/dL 103(H) 99 96  BUN 6 - 20 mg/dL '7 11 12  ' Creatinine 0.44 - 1.00 mg/dL 0.70 0.62 0.56  Sodium 135 - 145 mmol/L 138 140 136  Potassium 3.5 - 5.1 mmol/L 4.1 4.4 4.4  Chloride 98 - 111 mmol/L 106 106 103  CO2 22 - 32 mmol/L '23 24 26  ' Calcium 8.9 - 10.3 mg/dL 8.5(L) 9.2 9.3  Total Protein  6.5 - 8.1 g/dL 6.7 7.1 7.3  Total Bilirubin 0.3 - 1.2 mg/dL 0.2(L) 0.3 0.4  Alkaline Phos 38 - 126 U/L 113 110 116  AST 15 - 41 U/L 16 23 12(L)  ALT 0 - 44 U/L 19 34 13    Lab Results  Component Value Date   WBC 9.6 06/23/2019   HGB 11.5 (L) 06/23/2019   HCT 35.6 (L) 06/23/2019   MCV 87.0 06/23/2019   PLT 350 06/23/2019   NEUTROABS 6.9 06/23/2019    ASSESSMENT & PLAN:  Malignant neoplasm of overlapping sites of left breast in female, estrogen receptor negative ([place]) 04/25/2019:Patient palpated a left breast and axillary mass x63month. [place] the left breast showed a 3.6cm mass at the 1 o'clock position with 6 smaller adjacent masses extending toward the nipple ranging in size from 1.1cm to 4.0cm. [place] the left axilla showed five bulky lymph nodes, the largest measuring 4.2cm, and second largest measuring 2.8cm. 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 T1c N2 stage IIIc Based on PET CT  scan showing bilateral cervical nodes, it is stage IV (however her goal is to cure given the oligometastatic nature of her cancer.)  Treatment plan: 1. Neoadjuvant chemotherapy with Adriamycin and Cytoxanevery 3 weeks4 followed byTaxolweekly 12with carboplatin every 3 weeks and based on keynote 522 clinical trial we will add pembrolizumab every 3 weeks(8 cycles every 3 weeks) After she finishes neoadjuvant chemoappearing sternal lesions. Go on pembrolizumab maintenance every 3 weeks for 9 more cycles 2. Followed bymastectomy andaxillary lymph nodedissection 3. Followed by adjuvant radiation therapy ----------------------------------------------------------------------------------------------------------------------------------------------------- Current treatment: Cycle 3Adriamycin and Cytoxan with pembrolizumab  Chemo toxicities: 1.  Nausea: Mild 2.  Oral thrush: Resolved Monitoring closely for toxicities including immune mediated adverse  effects. Monitoring closely for thyroid toxicities: PET/CT showed a thyroid inflammation.  TSH on 05/19/2019: 0.88 We will obtain a TSH today.  PET CT scan 3/71/9070: Hypermetabolic nodules in the left breast and left axilla and subpectoral adenopathy.  Small hypermetabolic lymph nodes in the neck.  Marrow activity from G-CSF, thyroid activity possibly from immunotherapy.  MRI of the liver and LN: decided to observe after much discussion   Return to clinic in 3 weeks for cycle 4  No orders of the defined types were placed in this encounter.  The patient has a good understanding of the overall plan. she agrees with it. she will call with any problems that may develop before the next visit here.  Total time spent: 30 mins including face to face time and time spent for planning, charting and coordination of care  [name], MD 06/23/2019  I, Cloyde Reams Dorshimer, am acting as scribe for Dr. [name].  I have reviewed the above documentation for accuracy and completeness, and I agree with the above.

## 2019-06-23 ENCOUNTER — Inpatient Hospital Stay: Payer: 59

## 2019-06-23 ENCOUNTER — Other Ambulatory Visit: Payer: Self-pay

## 2019-06-23 ENCOUNTER — Inpatient Hospital Stay: Payer: 59 | Attending: Hematology and Oncology

## 2019-06-23 ENCOUNTER — Inpatient Hospital Stay (HOSPITAL_BASED_OUTPATIENT_CLINIC_OR_DEPARTMENT_OTHER): Payer: 59 | Admitting: Hematology and Oncology

## 2019-06-23 DIAGNOSIS — Z5112 Encounter for antineoplastic immunotherapy: Secondary | ICD-10-CM | POA: Diagnosis not present

## 2019-06-23 DIAGNOSIS — Z171 Estrogen receptor negative status [ER-]: Secondary | ICD-10-CM

## 2019-06-23 DIAGNOSIS — Z5189 Encounter for other specified aftercare: Secondary | ICD-10-CM | POA: Diagnosis not present

## 2019-06-23 DIAGNOSIS — C50812 Malignant neoplasm of overlapping sites of left female breast: Secondary | ICD-10-CM

## 2019-06-23 DIAGNOSIS — Z5111 Encounter for antineoplastic chemotherapy: Secondary | ICD-10-CM | POA: Diagnosis present

## 2019-06-23 DIAGNOSIS — D649 Anemia, unspecified: Secondary | ICD-10-CM | POA: Insufficient documentation

## 2019-06-23 LAB — CBC WITH DIFFERENTIAL (CANCER CENTER ONLY)
Abs Immature Granulocytes: 0.09 10*3/uL — ABNORMAL HIGH (ref 0.00–0.07)
Basophils Absolute: 0.1 10*3/uL (ref 0.0–0.1)
Basophils Relative: 1 %
Eosinophils Absolute: 0.1 10*3/uL (ref 0.0–0.5)
Eosinophils Relative: 1 %
HCT: 35.6 % — ABNORMAL LOW (ref 36.0–46.0)
Hemoglobin: 11.5 g/dL — ABNORMAL LOW (ref 12.0–15.0)
Immature Granulocytes: 1 %
Lymphocytes Relative: 16 %
Lymphs Abs: 1.6 10*3/uL (ref 0.7–4.0)
MCH: 28.1 pg (ref 26.0–34.0)
MCHC: 32.3 g/dL (ref 30.0–36.0)
MCV: 87 fL (ref 80.0–100.0)
Monocytes Absolute: 0.8 10*3/uL (ref 0.1–1.0)
Monocytes Relative: 9 %
Neutro Abs: 6.9 10*3/uL (ref 1.7–7.7)
Neutrophils Relative %: 72 %
Platelet Count: 350 10*3/uL (ref 150–400)
RBC: 4.09 MIL/uL (ref 3.87–5.11)
RDW: 15.8 % — ABNORMAL HIGH (ref 11.5–15.5)
WBC Count: 9.6 10*3/uL (ref 4.0–10.5)
nRBC: 0 % (ref 0.0–0.2)

## 2019-06-23 LAB — CMP (CANCER CENTER ONLY)
ALT: 19 U/L (ref 0–44)
AST: 16 U/L (ref 15–41)
Albumin: 3.4 g/dL — ABNORMAL LOW (ref 3.5–5.0)
Alkaline Phosphatase: 113 U/L (ref 38–126)
Anion gap: 9 (ref 5–15)
BUN: 7 mg/dL (ref 6–20)
CO2: 23 mmol/L (ref 22–32)
Calcium: 8.5 mg/dL — ABNORMAL LOW (ref 8.9–10.3)
Chloride: 106 mmol/L (ref 98–111)
Creatinine: 0.7 mg/dL (ref 0.44–1.00)
GFR, Est AFR Am: >60 mL/min (ref >60)
GFR, Estimated: >60 mL/min (ref >60)
Glucose, Bld: 103 mg/dL — ABNORMAL HIGH (ref 70–99)
Potassium: 4.1 mmol/L (ref 3.5–5.1)
Sodium: 138 mmol/L (ref 135–145)
Total Bilirubin: 0.2 mg/dL — ABNORMAL LOW (ref 0.3–1.2)
Total Protein: 6.7 g/dL (ref 6.5–8.1)

## 2019-06-23 MED ORDER — SODIUM CHLORIDE 0.9 % IV SOLN
Freq: Once | INTRAVENOUS | Status: AC
  Filled 2019-06-23: qty 250

## 2019-06-23 MED ORDER — DEXAMETHASONE SODIUM PHOSPHATE 10 MG/ML IJ SOLN
10.0000 mg | Freq: Once | INTRAMUSCULAR | Status: AC
  Administered 2019-06-23: 10 mg via INTRAVENOUS

## 2019-06-23 MED ORDER — DOXORUBICIN HCL CHEMO IV INJECTION 2 MG/ML
60.0000 mg/m2 | Freq: Once | INTRAVENOUS | Status: AC
  Administered 2019-06-23: 128 mg via INTRAVENOUS
  Filled 2019-06-23: qty 64

## 2019-06-23 MED ORDER — SODIUM CHLORIDE 0.9 % IV SOLN
150.0000 mg | Freq: Once | INTRAVENOUS | Status: AC
  Administered 2019-06-23: 150 mg via INTRAVENOUS
  Filled 2019-06-23: qty 150

## 2019-06-23 MED ORDER — SODIUM CHLORIDE 0.9 % IV SOLN
2.0000 mg/kg | Freq: Once | INTRAVENOUS | Status: AC
  Administered 2019-06-23: 200 mg via INTRAVENOUS
  Filled 2019-06-23: qty 8

## 2019-06-23 MED ORDER — SODIUM CHLORIDE 0.9 % IV SOLN
600.0000 mg/m2 | Freq: Once | INTRAVENOUS | Status: AC
  Administered 2019-06-23: 1280 mg via INTRAVENOUS
  Filled 2019-06-23: qty 64

## 2019-06-23 MED ORDER — PALONOSETRON HCL INJECTION 0.25 MG/5ML
INTRAVENOUS | Status: AC
  Filled 2019-06-23: qty 5

## 2019-06-23 MED ORDER — PALONOSETRON HCL INJECTION 0.25 MG/5ML
0.2500 mg | Freq: Once | INTRAVENOUS | Status: AC
  Administered 2019-06-23: 0.25 mg via INTRAVENOUS

## 2019-06-23 MED ORDER — HEPARIN SOD (PORK) LOCK FLUSH 100 UNIT/ML IV SOLN
500.0000 [IU] | Freq: Once | INTRAVENOUS | Status: AC | PRN
  Administered 2019-06-23: 500 [IU]
  Filled 2019-06-23: qty 5

## 2019-06-23 MED ORDER — DEXAMETHASONE SODIUM PHOSPHATE 10 MG/ML IJ SOLN
INTRAMUSCULAR | Status: AC
  Filled 2019-06-23: qty 1

## 2019-06-23 MED ORDER — SODIUM CHLORIDE 0.9% FLUSH
10.0000 mL | INTRAVENOUS | Status: DC | PRN
  Administered 2019-06-23: 10 mL
  Filled 2019-06-23: qty 10

## 2019-06-23 NOTE — Patient Instructions (Signed)
[facility] Discharge Instructions for Patients Receiving Chemotherapy  Today you received the following chemotherapy agents Pembrolizumab (KEYTRUDA), Doxorubicin (ADRIAMYCIN) & Cyclophosphamide (CYTOXAN).  To help prevent nausea and vomiting after your treatment, we encourage you to take your nausea medication as prescribed.   If you develop nausea and vomiting that is not controlled by your nausea medication, call the clinic.   BELOW ARE SYMPTOMS THAT SHOULD BE REPORTED IMMEDIATELY:  *FEVER GREATER THAN 100.5 F  *CHILLS WITH OR WITHOUT FEVER  NAUSEA AND VOMITING THAT IS NOT CONTROLLED WITH YOUR NAUSEA MEDICATION  *UNUSUAL SHORTNESS OF BREATH  *UNUSUAL BRUISING OR BLEEDING  TENDERNESS IN MOUTH AND THROAT WITH OR WITHOUT PRESENCE OF ULCERS  *URINARY PROBLEMS  *BOWEL PROBLEMS  UNUSUAL RASH Items with * indicate a potential emergency and should be followed up as soon as possible.  Feel free to call the clinic should you have any questions or concerns. The clinic phone number is [phone].  Please show the [facility] at check-in to the Emergency Department and triage nurse.  Coronavirus (COVID-19) Are you at risk?  Are you at risk for the Coronavirus (COVID-19)?  To be considered HIGH RISK for Coronavirus (COVID-19), you have to meet the following criteria:  . Traveled to [place], [place], [place], [place] or [place]; or in the [place] to [place], [place], [place], or [place]; and have fever, cough, and shortness of breath within the last 2 weeks of travel OR . Been in close contact with a person diagnosed with COVID-19 within the last 2 weeks and have fever, cough, and shortness of breath . IF YOU DO NOT MEET THESE CRITERIA, YOU ARE CONSIDERED LOW RISK FOR COVID-19.  What to do if you are HIGH RISK for COVID-19?  Marland Kitchen If you are having a medical emergency, call 911. . Seek medical care right away. Before you go to a doctor's office,  urgent care or emergency department, call ahead and tell them about your recent travel, contact with someone diagnosed with COVID-19, and your symptoms. You should receive instructions from your physician's office regarding next steps of care.  . When you arrive at healthcare provider, tell the healthcare staff immediately you have returned from visiting [place], [place], [place], [place] or [place]; or traveled in the [place] to [place], [place], [place], or [place]; in the last two weeks or you have been in close contact with a person diagnosed with COVID-19 in the last 2 weeks.   . Tell the health care staff about your symptoms: fever, cough and shortness of breath. . After you have been seen by a medical provider, you will be either: o Tested for (COVID-19) and discharged home on quarantine except to seek medical care if symptoms worsen, and asked to  - Stay home and avoid contact with others until you get your results (4-5 days)  - Avoid travel on public transportation if possible (such as bus, train, or airplane) or o Sent to the Emergency Department by EMS for evaluation, COVID-19 testing, and possible admission depending on your condition and test results.  What to do if you are LOW RISK for COVID-19?  Reduce your risk of any infection by using the same precautions used for avoiding the common cold or flu:  Marland Kitchen Wash your hands often with soap and warm water for at least 20 seconds.  If soap and water are not readily available, use an alcohol-based hand sanitizer with at least 60% alcohol.  Marland Kitchen  If coughing or sneezing, cover your mouth and nose by coughing or sneezing into the elbow areas of your shirt or coat, into a tissue or into your sleeve (not your hands). . Avoid shaking hands with others and consider head nods or verbal greetings only. . Avoid touching your eyes, nose, or mouth with unwashed hands.  . Avoid close contact with people who are sick. . Avoid places or events with  large numbers of people in one location, like concerts or sporting events. . Carefully consider travel plans you have or are making. . If you are planning any travel outside or inside the [place], visit the CDC's Travelers' Health webpage for the latest health notices. . If you have some symptoms but not all symptoms, continue to monitor at home and seek medical attention if your symptoms worsen. . If you are having a medical emergency, call 911.   [facility] / e-Visit: [email or web address]         [facility]: [facility]: [id number]                   [facility]: [phone]

## 2019-06-23 NOTE — Assessment & Plan Note (Signed)
04/25/2019:Patient palpated a left breast and axillary mass x52month. [place]of the left breast showed a 3.6cm mass at the 1 o'clock position with 6 smaller adjacent masses extending toward the nipple ranging in size from 1.1cm to 4.0cm. [place]of the left axilla showed five bulky lymph nodes, the largest measuring 4.2cm, and second largest measuring 2.8cm. 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 T1c N2 stage IIIc Based on PET CT scan showing bilateral cervical nodes, it is stage IV (however her goal is to cure given the oligometastatic nature of her cancer.)  Treatment plan: 1. Neoadjuvant chemotherapy with Adriamycin and Cytoxanevery 3 weeks4 followed byTaxolweekly 12with carboplatin every 3 weeks and based on keynote 522 clinical trial we will add pembrolizumab every 3 weeks(8 cycles every 3 weeks) After she finishes neoadjuvant chemoappearing sternal lesions. Go on pembrolizumab maintenance every 3 weeks for 9 more cycles 2. Followed bymastectomy andaxillary lymph nodedissection 3. Followed by adjuvant radiation therapy ----------------------------------------------------------------------------------------------------------------------------------------------------- Current treatment: Cycle 3Adriamycin and Cytoxan with pembrolizumab  Chemo toxicities: 1.  Nausea: Mild 2.  Oral thrush: Resolved Monitoring closely for toxicities including immune mediated adverse effects. Monitoring closely for thyroid toxicities: PET/CT showed a thyroid inflammation.  TSH on 05/19/2019: 0.88  PET CT scan 27/50/5183 Hypermetabolic nodules in the left breast and left axilla and subpectoral adenopathy.  Small hypermetabolic lymph nodes in the neck.  Marrow activity from G-CSF, thyroid activity possibly from immunotherapy.  MRI of the liver and LN: decided to observe after much discussion   Return to clinic in 3 weeks for cycle 4

## 2019-06-24 ENCOUNTER — Encounter: Payer: Self-pay | Admitting: *Deleted

## 2019-06-25 ENCOUNTER — Other Ambulatory Visit: Payer: Self-pay

## 2019-06-25 ENCOUNTER — Inpatient Hospital Stay: Payer: 59

## 2019-06-25 VITALS — BP 108/66 | HR 78 | Temp 98.9°F | Resp 18

## 2019-06-25 DIAGNOSIS — Z171 Estrogen receptor negative status [ER-]: Secondary | ICD-10-CM

## 2019-06-25 DIAGNOSIS — C50812 Malignant neoplasm of overlapping sites of left female breast: Secondary | ICD-10-CM

## 2019-06-25 DIAGNOSIS — Z5112 Encounter for antineoplastic immunotherapy: Secondary | ICD-10-CM | POA: Diagnosis not present

## 2019-06-25 MED ORDER — PEGFILGRASTIM-CBQV 6 MG/0.6ML ~~LOC~~ SOSY
6.0000 mg | PREFILLED_SYRINGE | Freq: Once | SUBCUTANEOUS | Status: AC
  Administered 2019-06-25: 6 mg via SUBCUTANEOUS

## 2019-06-25 NOTE — Patient Instructions (Signed)

## 2019-06-27 ENCOUNTER — Encounter: Payer: Self-pay | Admitting: *Deleted

## 2019-07-08 NOTE — Progress Notes (Signed)
Pharmacist Chemotherapy Monitoring - Follow Up Assessment    I verify that I have reviewed each item in the below checklist:  . Regimen for the patient is scheduled for the appropriate day and plan matches scheduled date. Marland Kitchen Appropriate non-routine labs are ordered dependent on drug ordered. . If applicable, additional medications reviewed and ordered per protocol based on lifetime cumulative doses and/or treatment regimen.   Plan for follow-up and/or issues identified: No . I-vent associated with next due treatment: No . MD and/or nursing notified: No  Philomena Course 07/08/2019 10:07 AM

## 2019-07-14 ENCOUNTER — Inpatient Hospital Stay (HOSPITAL_BASED_OUTPATIENT_CLINIC_OR_DEPARTMENT_OTHER): Payer: 59 | Admitting: Adult Health

## 2019-07-14 ENCOUNTER — Inpatient Hospital Stay: Payer: 59 | Attending: Hematology and Oncology

## 2019-07-14 ENCOUNTER — Inpatient Hospital Stay: Payer: 59

## 2019-07-14 ENCOUNTER — Encounter: Payer: Self-pay | Admitting: *Deleted

## 2019-07-14 ENCOUNTER — Other Ambulatory Visit: Payer: Self-pay

## 2019-07-14 VITALS — BP 132/92 | HR 92 | Temp 98.7°F | Resp 20 | Ht 63.0 in | Wt 227.9 lb

## 2019-07-14 DIAGNOSIS — Z95828 Presence of other vascular implants and grafts: Secondary | ICD-10-CM | POA: Insufficient documentation

## 2019-07-14 DIAGNOSIS — Z5112 Encounter for antineoplastic immunotherapy: Secondary | ICD-10-CM | POA: Diagnosis present

## 2019-07-14 DIAGNOSIS — Z171 Estrogen receptor negative status [ER-]: Secondary | ICD-10-CM | POA: Diagnosis not present

## 2019-07-14 DIAGNOSIS — C50812 Malignant neoplasm of overlapping sites of left female breast: Secondary | ICD-10-CM

## 2019-07-14 DIAGNOSIS — Z5189 Encounter for other specified aftercare: Secondary | ICD-10-CM | POA: Diagnosis not present

## 2019-07-14 DIAGNOSIS — Z79899 Other long term (current) drug therapy: Secondary | ICD-10-CM | POA: Diagnosis not present

## 2019-07-14 DIAGNOSIS — Z5111 Encounter for antineoplastic chemotherapy: Secondary | ICD-10-CM | POA: Insufficient documentation

## 2019-07-14 LAB — CBC WITH DIFFERENTIAL (CANCER CENTER ONLY)
Abs Immature Granulocytes: 0.19 10*3/uL — ABNORMAL HIGH (ref 0.00–0.07)
Basophils Absolute: 0.1 10*3/uL (ref 0.0–0.1)
Basophils Relative: 1 %
Eosinophils Absolute: 0.1 10*3/uL (ref 0.0–0.5)
Eosinophils Relative: 1 %
HCT: 35.6 % — ABNORMAL LOW (ref 36.0–46.0)
Hemoglobin: 11.6 g/dL — ABNORMAL LOW (ref 12.0–15.0)
Immature Granulocytes: 2 %
Lymphocytes Relative: 15 %
Lymphs Abs: 1.8 10*3/uL (ref 0.7–4.0)
MCH: 28.6 pg (ref 26.0–34.0)
MCHC: 32.6 g/dL (ref 30.0–36.0)
MCV: 87.9 fL (ref 80.0–100.0)
Monocytes Absolute: 0.8 10*3/uL (ref 0.1–1.0)
Monocytes Relative: 7 %
Neutro Abs: 8.7 10*3/uL — ABNORMAL HIGH (ref 1.7–7.7)
Neutrophils Relative %: 74 %
Platelet Count: 423 10*3/uL — ABNORMAL HIGH (ref 150–400)
RBC: 4.05 MIL/uL (ref 3.87–5.11)
RDW: 17.3 % — ABNORMAL HIGH (ref 11.5–15.5)
WBC Count: 11.8 10*3/uL — ABNORMAL HIGH (ref 4.0–10.5)
nRBC: 0 % (ref 0.0–0.2)

## 2019-07-14 LAB — CMP (CANCER CENTER ONLY)
ALT: 18 U/L (ref 0–44)
AST: 25 U/L (ref 15–41)
Albumin: 3.7 g/dL (ref 3.5–5.0)
Alkaline Phosphatase: 111 U/L (ref 38–126)
Anion gap: 12 (ref 5–15)
BUN: 13 mg/dL (ref 6–20)
CO2: 23 mmol/L (ref 22–32)
Calcium: 9.5 mg/dL (ref 8.9–10.3)
Chloride: 103 mmol/L (ref 98–111)
Creatinine: 0.84 mg/dL (ref 0.44–1.00)
GFR, Est AFR Am: >60 mL/min (ref >60)
GFR, Estimated: >60 mL/min (ref >60)
Glucose, Bld: 101 mg/dL — ABNORMAL HIGH (ref 70–99)
Potassium: 4.2 mmol/L (ref 3.5–5.1)
Sodium: 138 mmol/L (ref 135–145)
Total Bilirubin: 0.2 mg/dL — ABNORMAL LOW (ref 0.3–1.2)
Total Protein: 7.4 g/dL (ref 6.5–8.1)

## 2019-07-14 MED ORDER — SODIUM CHLORIDE 0.9 % IV SOLN
150.0000 mg | Freq: Once | INTRAVENOUS | Status: AC
  Administered 2019-07-14: 13:00:00 150 mg via INTRAVENOUS
  Filled 2019-07-14: qty 150

## 2019-07-14 MED ORDER — DEXAMETHASONE SODIUM PHOSPHATE 10 MG/ML IJ SOLN
INTRAMUSCULAR | Status: AC
  Filled 2019-07-14: qty 1

## 2019-07-14 MED ORDER — PALONOSETRON HCL INJECTION 0.25 MG/5ML
INTRAVENOUS | Status: AC
  Filled 2019-07-14: qty 5

## 2019-07-14 MED ORDER — HEPARIN SOD (PORK) LOCK FLUSH 100 UNIT/ML IV SOLN
500.0000 [IU] | Freq: Once | INTRAVENOUS | Status: AC | PRN
  Administered 2019-07-14: 15:00:00 500 [IU]
  Filled 2019-07-14: qty 5

## 2019-07-14 MED ORDER — DEXAMETHASONE SODIUM PHOSPHATE 10 MG/ML IJ SOLN
10.0000 mg | Freq: Once | INTRAMUSCULAR | Status: AC
  Administered 2019-07-14: 13:00:00 10 mg via INTRAVENOUS

## 2019-07-14 MED ORDER — SODIUM CHLORIDE 0.9 % IV SOLN
600.0000 mg/m2 | Freq: Once | INTRAVENOUS | Status: AC
  Administered 2019-07-14: 1280 mg via INTRAVENOUS
  Filled 2019-07-14: qty 64

## 2019-07-14 MED ORDER — SODIUM CHLORIDE 0.9 % IV SOLN
2.0000 mg/kg | Freq: Once | INTRAVENOUS | Status: AC
  Administered 2019-07-14: 200 mg via INTRAVENOUS
  Filled 2019-07-14: qty 8

## 2019-07-14 MED ORDER — SODIUM CHLORIDE 0.9 % IV SOLN
Freq: Once | INTRAVENOUS | Status: AC
  Filled 2019-07-14: qty 250

## 2019-07-14 MED ORDER — PALONOSETRON HCL INJECTION 0.25 MG/5ML
0.2500 mg | Freq: Once | INTRAVENOUS | Status: AC
  Administered 2019-07-14: 13:00:00 0.25 mg via INTRAVENOUS

## 2019-07-14 MED ORDER — SODIUM CHLORIDE 0.9% FLUSH
10.0000 mL | INTRAVENOUS | Status: DC | PRN
  Administered 2019-07-14: 10 mL
  Filled 2019-07-14: qty 10

## 2019-07-14 MED ORDER — SODIUM CHLORIDE 0.9% FLUSH
10.0000 mL | INTRAVENOUS | Status: DC | PRN
  Administered 2019-07-14: 15:00:00 10 mL
  Filled 2019-07-14: qty 10

## 2019-07-14 MED ORDER — DOXORUBICIN HCL CHEMO IV INJECTION 2 MG/ML
60.0000 mg/m2 | Freq: Once | INTRAVENOUS | Status: AC
  Administered 2019-07-14: 14:00:00 128 mg via INTRAVENOUS
  Filled 2019-07-14: qty 64

## 2019-07-14 NOTE — Patient Instructions (Signed)
[facility] Discharge Instructions for Patients Receiving Chemotherapy  Today you received the following chemotherapy agents: pembrolizumab, doxorubicin, and cyclophosphamide.  To help prevent nausea and vomiting after your treatment, we encourage you to take your nausea medication as directed.   If you develop nausea and vomiting that is not controlled by your nausea medication, call the clinic.   BELOW ARE SYMPTOMS THAT SHOULD BE REPORTED IMMEDIATELY:  *FEVER GREATER THAN 100.5 F  *CHILLS WITH OR WITHOUT FEVER  NAUSEA AND VOMITING THAT IS NOT CONTROLLED WITH YOUR NAUSEA MEDICATION  *UNUSUAL SHORTNESS OF BREATH  *UNUSUAL BRUISING OR BLEEDING  TENDERNESS IN MOUTH AND THROAT WITH OR WITHOUT PRESENCE OF ULCERS  *URINARY PROBLEMS  *BOWEL PROBLEMS  UNUSUAL RASH Items with * indicate a potential emergency and should be followed up as soon as possible.  Feel free to call the clinic should you have any questions or concerns. The clinic phone number is [phone].  Please show the CHEMO ALERT CARD at check-in to the Emergency Department and triage nurse.   
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
## 2019-07-14 NOTE — Assessment & Plan Note (Addendum)
04/25/2019:Patient palpated a left breast and axillary mass x11month. [place]of the left breast showed a 3.6cm mass at the 1 o'clock position with 6 smaller adjacent masses extending toward the nipple ranging in size from 1.1cm to 4.0cm. [place]of the left axilla showed five bulky lymph nodes, the largest measuring 4.2cm, and second largest measuring 2.8cm. 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 T1c N2 stage IIIc Based on PET CT scan showing bilateral cervical nodes, it is stage IV (however her goal is to cure given the oligometastatic nature of her cancer.)  PET CT scan 21/58/7276 Hypermetabolic nodules in the left breast and left axilla and subpectoral adenopathy.  Small hypermetabolic lymph nodes in the neck.  Marrow activity from G-CSF, thyroid activity possibly from immunotherapy.  MRI of the liver and LN: decided to observe after much   Treatment plan: 1. Neoadjuvant chemotherapy with Adriamycin and Cytoxanevery 3 weeks4 followed byTaxolweekly 12with carboplatin every 3 weeks and based on keynote 522 clinical trial we will add pembrolizumab every 3 weeks(8 cycles every 3 weeks) After she finishes neoadjuvant chemoappearing sternal lesions. Go on pembrolizumab maintenance every 3 weeks for 9 more cycles 2. Followed bymastectomy andaxillary lymph nodedissection 3. Followed by adjuvant radiation therapy ----------------------------------------------------------------------------------------------------------------------------------------------------- Current treatment: Cycle 4Adriamycin and Cytoxan with pembrolizumab  Chemo toxicities: 1.  Nausea: Mild 2.  Oral thrush: Resolved  She notes her breast cancer is much improved and she has difficulty palpating any disease.  I placed an order for an ultrasound to evaluate for the mass, or left axillary adenopathy to establish a new baseline prior to starting the Taxol/Carbo.     Return to  clinic in 3 weeks for labs, f/u with Dr. GLindi Adie and Taxol/Carbo/Pembrolizumab

## 2019-07-15 ENCOUNTER — Other Ambulatory Visit: Payer: Self-pay | Admitting: Adult Health

## 2019-07-15 ENCOUNTER — Telehealth: Payer: Self-pay | Admitting: *Deleted

## 2019-07-15 DIAGNOSIS — C50812 Malignant neoplasm of overlapping sites of left female breast: Secondary | ICD-10-CM

## 2019-07-15 DIAGNOSIS — E039 Hypothyroidism, unspecified: Secondary | ICD-10-CM

## 2019-07-15 DIAGNOSIS — Z171 Estrogen receptor negative status [ER-]: Secondary | ICD-10-CM

## 2019-07-15 LAB — THYROID PANEL WITH TSH
Free Thyroxine Index: 0 — ABNORMAL LOW (ref 1.2–4.9)
T3 Uptake Ratio: 7 % — ABNORMAL LOW (ref 24–39)
T4, Total: 0.5 ug/dL — ABNORMAL LOW (ref 4.5–12.0)
TSH: 77 u[IU]/mL — ABNORMAL HIGH (ref 0.450–4.500)

## 2019-07-15 MED ORDER — LEVOTHYROXINE SODIUM 50 MCG PO TABS: 50.0000 ug | ORAL_TABLET | Freq: Every day | ORAL | 2 refills | Status: DC

## 2019-07-15 NOTE — Telephone Encounter (Signed)
Per Wilber Bihari, NP pt thyroid function is not within normal limits due to the pembrolizumab.  Synthroid 50 mcg script sent to pharmacy on file.  Pt educated on taking synthroid first thing in the morning and waiting one hour prior to eating. Pt also educated that MD and NP will continue to monitor thyroid functions to regulate medication.  Pt verbalized understanding.

## 2019-07-15 NOTE — Progress Notes (Signed)
Patient TSH is 77.  Synthroid sent into pharmacy.  Notified nursing.

## 2019-07-16 ENCOUNTER — Inpatient Hospital Stay: Payer: 59

## 2019-07-16 ENCOUNTER — Other Ambulatory Visit: Payer: Self-pay

## 2019-07-16 VITALS — BP 118/74 | HR 72 | Temp 98.7°F | Resp 17

## 2019-07-16 DIAGNOSIS — Z5112 Encounter for antineoplastic immunotherapy: Secondary | ICD-10-CM | POA: Diagnosis not present

## 2019-07-16 DIAGNOSIS — Z171 Estrogen receptor negative status [ER-]: Secondary | ICD-10-CM

## 2019-07-16 DIAGNOSIS — C50812 Malignant neoplasm of overlapping sites of left female breast: Secondary | ICD-10-CM

## 2019-07-16 MED ORDER — PEGFILGRASTIM-CBQV 6 MG/0.6ML ~~LOC~~ SOSY
6.0000 mg | PREFILLED_SYRINGE | Freq: Once | SUBCUTANEOUS | Status: AC
  Administered 2019-07-16: 6 mg via SUBCUTANEOUS

## 2019-07-16 NOTE — Patient Instructions (Signed)

## 2019-07-19 ENCOUNTER — Encounter: Payer: Self-pay | Admitting: *Deleted

## 2019-07-19 NOTE — Progress Notes (Signed)
Left vm for pt to call [facility] to schedule [place] to assess response to chemo. Contact information provided for pt to schedule appt. Request return call for questions or needs. Contact information provided.

## 2019-07-25 ENCOUNTER — Encounter: Payer: Self-pay | Admitting: *Deleted

## 2019-07-26 ENCOUNTER — Encounter: Payer: Self-pay | Admitting: *Deleted

## 2019-07-29 NOTE — Progress Notes (Signed)
Pharmacist Chemotherapy Monitoring - Follow Up Assessment    I verify that I have reviewed each item in the below checklist:  . Regimen for the patient is scheduled for the appropriate day and plan matches scheduled date. Marland Kitchen Appropriate non-routine labs are ordered dependent on drug ordered. . If applicable, additional medications reviewed and ordered per protocol based on lifetime cumulative doses and/or treatment regimen.   Plan for follow-up and/or issues identified: No . I-vent associated with next due treatment: No . MD and/or nursing notified: No  Philomena Course 07/29/2019 10:22 AM

## 2019-08-02 ENCOUNTER — Ambulatory Visit
Admission: RE | Admit: 2019-08-02 | Discharge: 2019-08-02 | Disposition: A | Discharge status: Home / Self Care | Payer: 59 | Source: Ambulatory Visit | Attending: Adult Health | Admitting: Adult Health

## 2019-08-02 ENCOUNTER — Encounter: Payer: Self-pay | Admitting: *Deleted

## 2019-08-02 ENCOUNTER — Other Ambulatory Visit: Payer: Self-pay

## 2019-08-02 DIAGNOSIS — Z171 Estrogen receptor negative status [ER-]: Secondary | ICD-10-CM

## 2019-08-02 DIAGNOSIS — C50812 Malignant neoplasm of overlapping sites of left female breast: Secondary | ICD-10-CM

## 2019-08-03 ENCOUNTER — Encounter: Payer: Self-pay | Admitting: *Deleted

## 2019-08-04 ENCOUNTER — Other Ambulatory Visit: Payer: Self-pay

## 2019-08-04 ENCOUNTER — Inpatient Hospital Stay: Payer: 59

## 2019-08-04 VITALS — BP 124/86 | HR 79 | Temp 98.2°F | Resp 18 | Wt 227.5 lb

## 2019-08-04 DIAGNOSIS — Z95828 Presence of other vascular implants and grafts: Secondary | ICD-10-CM

## 2019-08-04 DIAGNOSIS — Z5112 Encounter for antineoplastic immunotherapy: Secondary | ICD-10-CM | POA: Diagnosis not present

## 2019-08-04 DIAGNOSIS — Z171 Estrogen receptor negative status [ER-]: Secondary | ICD-10-CM

## 2019-08-04 DIAGNOSIS — C50812 Malignant neoplasm of overlapping sites of left female breast: Secondary | ICD-10-CM

## 2019-08-04 LAB — CBC WITH DIFFERENTIAL (CANCER CENTER ONLY)
Abs Immature Granulocytes: 0.38 10*3/uL — ABNORMAL HIGH (ref 0.00–0.07)
Basophils Absolute: 0.2 10*3/uL — ABNORMAL HIGH (ref 0.0–0.1)
Basophils Relative: 1 %
Eosinophils Absolute: 0.2 10*3/uL (ref 0.0–0.5)
Eosinophils Relative: 1 %
HCT: 33.7 % — ABNORMAL LOW (ref 36.0–46.0)
Hemoglobin: 11 g/dL — ABNORMAL LOW (ref 12.0–15.0)
Immature Granulocytes: 3 %
Lymphocytes Relative: 11 %
Lymphs Abs: 1.7 10*3/uL (ref 0.7–4.0)
MCH: 29.4 pg (ref 26.0–34.0)
MCHC: 32.6 g/dL (ref 30.0–36.0)
MCV: 90.1 fL (ref 80.0–100.0)
Monocytes Absolute: 1.2 10*3/uL — ABNORMAL HIGH (ref 0.1–1.0)
Monocytes Relative: 8 %
Neutro Abs: 11.4 10*3/uL — ABNORMAL HIGH (ref 1.7–7.7)
Neutrophils Relative %: 76 %
Platelet Count: 384 10*3/uL (ref 150–400)
RBC: 3.74 MIL/uL — ABNORMAL LOW (ref 3.87–5.11)
RDW: 19.2 % — ABNORMAL HIGH (ref 11.5–15.5)
WBC Count: 14.9 10*3/uL — ABNORMAL HIGH (ref 4.0–10.5)
nRBC: 0.3 % — ABNORMAL HIGH (ref 0.0–0.2)

## 2019-08-04 LAB — CMP (CANCER CENTER ONLY)
ALT: 19 U/L (ref 0–44)
AST: 24 U/L (ref 15–41)
Albumin: 3.9 g/dL (ref 3.5–5.0)
Alkaline Phosphatase: 139 U/L — ABNORMAL HIGH (ref 38–126)
Anion gap: 12 (ref 5–15)
BUN: 9 mg/dL (ref 6–20)
CO2: 24 mmol/L (ref 22–32)
Calcium: 9.2 mg/dL (ref 8.9–10.3)
Chloride: 102 mmol/L (ref 98–111)
Creatinine: 0.83 mg/dL (ref 0.44–1.00)
GFR, Est AFR Am: >60 mL/min (ref >60)
GFR, Estimated: >60 mL/min (ref >60)
Glucose, Bld: 88 mg/dL (ref 70–99)
Potassium: 4.3 mmol/L (ref 3.5–5.1)
Sodium: 138 mmol/L (ref 135–145)
Total Bilirubin: 0.3 mg/dL (ref 0.3–1.2)
Total Protein: 7.7 g/dL (ref 6.5–8.1)

## 2019-08-04 MED ORDER — FAMOTIDINE IN NACL 20-0.9 MG/50ML-% IV SOLN
INTRAVENOUS | Status: AC
  Filled 2019-08-04: qty 50

## 2019-08-04 MED ORDER — SODIUM CHLORIDE 0.9 % IV SOLN
2.0000 mg/kg | Freq: Once | INTRAVENOUS | Status: AC
  Administered 2019-08-04: 200 mg via INTRAVENOUS
  Filled 2019-08-04: qty 8

## 2019-08-04 MED ORDER — SODIUM CHLORIDE 0.9% FLUSH
10.0000 mL | INTRAVENOUS | Status: DC | PRN
  Administered 2019-08-04: 10 mL
  Filled 2019-08-04: qty 10

## 2019-08-04 MED ORDER — SODIUM CHLORIDE 0.9 % IV SOLN
Freq: Once | INTRAVENOUS | Status: AC
  Filled 2019-08-04: qty 250

## 2019-08-04 MED ORDER — DIPHENHYDRAMINE HCL 50 MG/ML IJ SOLN
25.0000 mg | Freq: Once | INTRAMUSCULAR | Status: AC
  Administered 2019-08-04: 25 mg via INTRAVENOUS

## 2019-08-04 MED ORDER — SODIUM CHLORIDE 0.9 % IV SOLN
10.0000 mg | Freq: Once | INTRAVENOUS | Status: AC
  Administered 2019-08-04: 10 mg via INTRAVENOUS
  Filled 2019-08-04: qty 10

## 2019-08-04 MED ORDER — SODIUM CHLORIDE 0.9 % IV SOLN
150.0000 mg | Freq: Once | INTRAVENOUS | Status: AC
  Administered 2019-08-04: 150 mg via INTRAVENOUS
  Filled 2019-08-04: qty 150

## 2019-08-04 MED ORDER — DIPHENHYDRAMINE HCL 50 MG/ML IJ SOLN
INTRAMUSCULAR | Status: AC
  Filled 2019-08-04: qty 1

## 2019-08-04 MED ORDER — SODIUM CHLORIDE 0.9 % IV SOLN
700.0000 mg | Freq: Once | INTRAVENOUS | Status: AC
  Administered 2019-08-04: 700 mg via INTRAVENOUS
  Filled 2019-08-04: qty 70

## 2019-08-04 MED ORDER — HEPARIN SOD (PORK) LOCK FLUSH 100 UNIT/ML IV SOLN
500.0000 [IU] | Freq: Once | INTRAVENOUS | Status: AC | PRN
  Administered 2019-08-04: 500 [IU]
  Filled 2019-08-04: qty 5

## 2019-08-04 MED ORDER — FAMOTIDINE IN NACL 20-0.9 MG/50ML-% IV SOLN
20.0000 mg | Freq: Once | INTRAVENOUS | Status: AC
  Administered 2019-08-04: 20 mg via INTRAVENOUS

## 2019-08-04 MED ORDER — PALONOSETRON HCL INJECTION 0.25 MG/5ML
0.2500 mg | Freq: Once | INTRAVENOUS | Status: AC
  Administered 2019-08-04: 0.25 mg via INTRAVENOUS

## 2019-08-04 MED ORDER — SODIUM CHLORIDE 0.9 % IV SOLN
80.0000 mg/m2 | Freq: Once | INTRAVENOUS | Status: AC
  Administered 2019-08-04: 174 mg via INTRAVENOUS
  Filled 2019-08-04: qty 29

## 2019-08-04 MED ORDER — PALONOSETRON HCL INJECTION 0.25 MG/5ML
INTRAVENOUS | Status: AC
  Filled 2019-08-04: qty 5

## 2019-08-04 NOTE — Patient Instructions (Signed)
[facility] Discharge Instructions for Patients Receiving Chemotherapy  Today you received the following chemotherapy agents: pembrolizumab, paclitaxel, carboplatin  To help prevent nausea and vomiting after your treatment, we encourage you to take your nausea medication as directed.   If you develop nausea and vomiting that is not controlled by your nausea medication, call the clinic.   BELOW ARE SYMPTOMS THAT SHOULD BE REPORTED IMMEDIATELY:  *FEVER GREATER THAN 100.5 F  *CHILLS WITH OR WITHOUT FEVER  NAUSEA AND VOMITING THAT IS NOT CONTROLLED WITH YOUR NAUSEA MEDICATION  *UNUSUAL SHORTNESS OF BREATH  *UNUSUAL BRUISING OR BLEEDING  TENDERNESS IN MOUTH AND THROAT WITH OR WITHOUT PRESENCE OF ULCERS  *URINARY PROBLEMS  *BOWEL PROBLEMS  UNUSUAL RASH Items with * indicate a potential emergency and should be followed up as soon as possible.  Feel free to call the clinic should you have any questions or concerns. The clinic phone number is [phone].  Please show the [facility] at check-in to the Emergency Department and triage nurse.  Paclitaxel injection What is this medicine? PACLITAXEL (PAK li TAX el) is a chemotherapy drug. It targets fast dividing cells, like cancer cells, and causes these cells to die. This medicine is used to treat ovarian cancer, breast cancer, lung cancer, Kaposi's sarcoma, and other cancers. This medicine may be used for other purposes; ask your health care provider or pharmacist if you have questions. COMMON BRAND NAME(S): Onxol, Taxol What should I tell my health care provider before I take this medicine? They need to know if you have any of these conditions:  history of irregular heartbeat  liver disease  low blood counts, like low white cell, platelet, or red cell counts  lung or breathing disease, like asthma  tingling of the fingers or toes, or other nerve disorder  an unusual or allergic reaction to paclitaxel,  alcohol, polyoxyethylated castor oil, other chemotherapy, other medicines, foods, dyes, or preservatives  pregnant or trying to get pregnant  breast-feeding How should I use this medicine? This drug is given as an infusion into a vein. It is administered in a hospital or clinic by a specially trained health care professional. Talk to your pediatrician regarding the use of this medicine in children. Special care may be needed. Overdosage: If you think you have taken too much of this medicine contact a poison control center or emergency room at once. NOTE: This medicine is only for you. Do not share this medicine with others. What if I miss a dose? It is important not to miss your dose. Call your doctor or health care professional if you are unable to keep an appointment. What may interact with this medicine? Do not take this medicine with any of the following medications:  disulfiram  metronidazole This medicine may also interact with the following medications:  antiviral medicines for hepatitis, HIV or AIDS  certain antibiotics like erythromycin and clarithromycin  certain medicines for fungal infections like ketoconazole and itraconazole  certain medicines for seizures like carbamazepine, phenobarbital, phenytoin  gemfibrozil  nefazodone  rifampin  St. John's wort This list may not describe all possible interactions. Give your health care provider a list of all the medicines, herbs, non-prescription drugs, or dietary supplements you use. Also tell them if you smoke, drink alcohol, or use illegal drugs. Some items may interact with your medicine. What should I watch for while using this medicine? Your condition will be monitored carefully while you are receiving this medicine. You will need important  blood work done while you are taking this medicine. This medicine can cause serious allergic reactions. To reduce your risk you will need to take other medicine(s) before treatment  with this medicine. If you experience allergic reactions like skin rash, itching or hives, swelling of the face, lips, or tongue, tell your doctor or health care professional right away. In some cases, you may be given additional medicines to help with side effects. Follow all directions for their use. This drug may make you feel generally unwell. This is not uncommon, as chemotherapy can affect healthy cells as well as cancer cells. Report any side effects. Continue your course of treatment even though you feel ill unless your doctor tells you to stop. Call your doctor or health care professional for advice if you get a fever, chills or sore throat, or other symptoms of a cold or flu. Do not treat yourself. This drug decreases your body's ability to fight infections. Try to avoid being around people who are sick. This medicine may increase your risk to bruise or bleed. Call your doctor or health care professional if you notice any unusual bleeding. Be careful brushing and flossing your teeth or using a toothpick because you may get an infection or bleed more easily. If you have any dental work done, tell your dentist you are receiving this medicine. Avoid taking products that contain aspirin, acetaminophen, ibuprofen, naproxen, or ketoprofen unless instructed by your doctor. These medicines may hide a fever. Do not become pregnant while taking this medicine. Women should inform their doctor if they wish to become pregnant or think they might be pregnant. There is a potential for serious side effects to an unborn child. Talk to your health care professional or pharmacist for more information. Do not breast-feed an infant while taking this medicine. Men are advised not to father a child while receiving this medicine. This product may contain alcohol. Ask your pharmacist or healthcare provider if this medicine contains alcohol. Be sure to tell all healthcare providers you are taking this medicine. Certain  medicines, like metronidazole and disulfiram, can cause an unpleasant reaction when taken with alcohol. The reaction includes flushing, headache, nausea, vomiting, sweating, and increased thirst. The reaction can last from 30 minutes to several hours. What side effects may I notice from receiving this medicine? Side effects that you should report to your doctor or health care professional as soon as possible:  allergic reactions like skin rash, itching or hives, swelling of the face, lips, or tongue  breathing problems  changes in vision  fast, irregular heartbeat  high or low blood pressure  mouth sores  pain, tingling, numbness in the hands or feet  signs of decreased platelets or bleeding - bruising, pinpoint red spots on the skin, black, tarry stools, blood in the urine  signs of decreased red blood cells - unusually weak or tired, feeling faint or lightheaded, falls  signs of infection - fever or chills, cough, sore throat, pain or difficulty passing urine  signs and symptoms of liver injury like dark yellow or brown urine; general ill feeling or flu-like symptoms; light-colored stools; loss of appetite; nausea; right upper belly pain; unusually weak or tired; yellowing of the eyes or skin  swelling of the ankles, feet, hands  unusually slow heartbeat Side effects that usually do not require medical attention (report to your doctor or health care professional if they continue or are bothersome):  diarrhea  hair loss  loss of appetite  muscle or joint  pain  nausea, vomiting  pain, redness, or irritation at site where injected  tiredness This list may not describe all possible side effects. Call your doctor for medical advice about side effects. You may report side effects to FDA at 1-800-FDA-1088. Where should I keep my medicine? This drug is given in a hospital or clinic and will not be stored at home. NOTE: This sheet is a summary. It may not cover all possible  information. If you have questions about this medicine, talk to your doctor, pharmacist, or health care provider.  2020 Elsevier/Gold Standard (2016-12-02 13:14:55)  Carboplatin injection What is this medicine? CARBOPLATIN (KAR boe pla tin) is a chemotherapy drug. It targets fast dividing cells, like cancer cells, and causes these cells to die. This medicine is used to treat ovarian cancer and many other cancers. This medicine may be used for other purposes; ask your health care provider or pharmacist if you have questions. COMMON BRAND NAME(S): Paraplatin What should I tell my health care provider before I take this medicine? They need to know if you have any of these conditions:  blood disorders  hearing problems  kidney disease  recent or ongoing radiation therapy  an unusual or allergic reaction to carboplatin, cisplatin, other chemotherapy, other medicines, foods, dyes, or preservatives  pregnant or trying to get pregnant  breast-feeding How should I use this medicine? This drug is usually given as an infusion into a vein. It is administered in a hospital or clinic by a specially trained health care professional. Talk to your pediatrician regarding the use of this medicine in children. Special care may be needed. Overdosage: If you think you have taken too much of this medicine contact a poison control center or emergency room at once. NOTE: This medicine is only for you. Do not share this medicine with others. What if I miss a dose? It is important not to miss a dose. Call your doctor or health care professional if you are unable to keep an appointment. What may interact with this medicine?  medicines for seizures  medicines to increase blood counts like filgrastim, pegfilgrastim, sargramostim  some antibiotics like amikacin, gentamicin, neomycin, streptomycin, tobramycin  vaccines Talk to your doctor or health care professional before taking any of these  medicines:  acetaminophen  aspirin  ibuprofen  ketoprofen  naproxen This list may not describe all possible interactions. Give your health care provider a list of all the medicines, herbs, non-prescription drugs, or dietary supplements you use. Also tell them if you smoke, drink alcohol, or use illegal drugs. Some items may interact with your medicine. What should I watch for while using this medicine? Your condition will be monitored carefully while you are receiving this medicine. You will need important blood work done while you are taking this medicine. This drug may make you feel generally unwell. This is not uncommon, as chemotherapy can affect healthy cells as well as cancer cells. Report any side effects. Continue your course of treatment even though you feel ill unless your doctor tells you to stop. In some cases, you may be given additional medicines to help with side effects. Follow all directions for their use. Call your doctor or health care professional for advice if you get a fever, chills or sore throat, or other symptoms of a cold or flu. Do not treat yourself. This drug decreases your body's ability to fight infections. Try to avoid being around people who are sick. This medicine may increase your risk to bruise  or bleed. Call your doctor or health care professional if you notice any unusual bleeding. Be careful brushing and flossing your teeth or using a toothpick because you may get an infection or bleed more easily. If you have any dental work done, tell your dentist you are receiving this medicine. Avoid taking products that contain aspirin, acetaminophen, ibuprofen, naproxen, or ketoprofen unless instructed by your doctor. These medicines may hide a fever. Do not become pregnant while taking this medicine. Women should inform their doctor if they wish to become pregnant or think they might be pregnant. There is a potential for serious side effects to an unborn child. Talk  to your health care professional or pharmacist for more information. Do not breast-feed an infant while taking this medicine. What side effects may I notice from receiving this medicine? Side effects that you should report to your doctor or health care professional as soon as possible:  allergic reactions like skin rash, itching or hives, swelling of the face, lips, or tongue  signs of infection - fever or chills, cough, sore throat, pain or difficulty passing urine  signs of decreased platelets or bleeding - bruising, pinpoint red spots on the skin, black, tarry stools, nosebleeds  signs of decreased red blood cells - unusually weak or tired, fainting spells, lightheadedness  breathing problems  changes in hearing  changes in vision  chest pain  high blood pressure  low blood counts - This drug may decrease the number of white blood cells, red blood cells and platelets. You may be at increased risk for infections and bleeding.  nausea and vomiting  pain, swelling, redness or irritation at the injection site  pain, tingling, numbness in the hands or feet  problems with balance, talking, walking  trouble passing urine or change in the amount of urine Side effects that usually do not require medical attention (report to your doctor or health care professional if they continue or are bothersome):  hair loss  loss of appetite  metallic taste in the mouth or changes in taste This list may not describe all possible side effects. Call your doctor for medical advice about side effects. You may report side effects to FDA at 1-800-FDA-1088. Where should I keep my medicine? This drug is given in a hospital or clinic and will not be stored at home. NOTE: This sheet is a summary. It may not cover all possible information. If you have questions about this medicine, talk to your doctor, pharmacist, or health care provider.  2020 Elsevier/Gold Standard (2007-07-06 14:38:05)

## 2019-08-05 ENCOUNTER — Telehealth: Payer: Self-pay | Admitting: *Deleted

## 2019-08-05 LAB — THYROID PANEL WITH TSH
Free Thyroxine Index: 0.1 — ABNORMAL LOW (ref 1.2–4.9)
T3 Uptake Ratio: 7 % — ABNORMAL LOW (ref 24–39)
T4, Total: 1.5 ug/dL — ABNORMAL LOW (ref 4.5–12.0)
TSH: 76.1 u[IU]/mL — ABNORMAL HIGH (ref 0.450–4.500)

## 2019-08-05 NOTE — Telephone Encounter (Signed)
-----   Message from Teodoro Spray, RN sent at 08/04/2019  3:00 PM EDT ----- Regarding: Dr. Lindi Adie first time chemo followup Dr. Lindi Adie patient first time Taxol/carbo on 08/04/19.  Patient tolerated treatment well.  Thank you.

## 2019-08-05 NOTE — Progress Notes (Signed)
Pharmacist Chemotherapy Monitoring - Follow Up Assessment    I verify that I have reviewed each item in the below checklist:  . Regimen for the patient is scheduled for the appropriate day and plan matches scheduled date. Marland Kitchen Appropriate non-routine labs are ordered dependent on drug ordered. . If applicable, additional medications reviewed and ordered per protocol based on lifetime cumulative doses and/or treatment regimen.   Plan for follow-up and/or issues identified: No . I-vent associated with next due treatment: No . MD and/or nursing notified: No  Acquanetta Belling 08/05/2019 4:30 PM

## 2019-08-05 NOTE — Telephone Encounter (Signed)
Called pt to see how she did with her treatment yest.  She reports doing well with no c/o's.  She reports sleeping a lot post benadryl but other than that, no problems.  Reminded to call with any concerns/questions & she expressed understanding.

## 2019-08-08 ENCOUNTER — Other Ambulatory Visit: Payer: 59

## 2019-08-10 NOTE — Progress Notes (Signed)
Patient Care Team: Patient, No Pcp Per as PCP - General (General Practice) [name], RN as Oncology Nurse Navigator [name], RN as Oncology Nurse Navigator [name], MD as Consulting Physician (General Surgery) [name], MD as Consulting Physician (Hematology and Oncology) [name], MD as Consulting Physician (Radiation Oncology)  DIAGNOSIS:    ICD-10-CM   1. Malignant neoplasm of overlapping sites of left breast in female, estrogen receptor negative ([place])  C50.812    Z17.1     SUMMARY OF ONCOLOGIC HISTORY: Oncology History  Malignant neoplasm of overlapping sites of left breast in female, estrogen receptor negative ([place])  04/25/2019 Initial Diagnosis   Patient palpated a left breast and axillary mass x13month. [place] left breast showed a 3.6cm mass at the 1 o'clock position with 6 smaller adjacent masses extending toward the nipple ranging in size from 1.1cm to 4.0cm. [place] the left axilla showed five bulky lymph nodes, the largest measuring 4.2cm, and second largest measuring 2.8cm. 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    04/27/2019 Cancer Staging   Staging form: Breast, AJCC 8th Edition - Clinical: Stage IIIC (cT1c, cN2a, cM0, G3, ER-, PR-, HER2-) - Signed by GNicholas Lose MD on 04/27/2019   05/12/2019 -  Chemotherapy   The patient had DOXOrubicin (ADRIAMYCIN) chemo injection 128 mg, 60 mg/m2 = 128 mg, Intravenous,  Once, 4 of 4 cycles Administration: 128 mg (05/12/2019), 128 mg (06/03/2019), 128 mg (06/23/2019), 128 mg (07/14/2019) palonosetron (ALOXI) injection 0.25 mg, 0.25 mg, Intravenous,  Once, 5 of 8 cycles Administration: 0.25 mg (05/12/2019), 0.25 mg (08/04/2019), 0.25 mg (06/03/2019), 0.25 mg (06/23/2019), 0.25 mg (07/14/2019) pegfilgrastim-cbqv (UDENYCA) injection 6 mg, 6 mg, Subcutaneous, Once, 4 of 4 cycles Administration: 6 mg (05/14/2019), 6 mg (06/06/2019), 6 mg (06/25/2019), 6 mg  (07/16/2019) CARBOplatin (PARAPLATIN) 700 mg in sodium chloride 0.9 % 250 mL chemo infusion, 700 mg (100 % of original dose 700 mg), Intravenous,  Once, 1 of 4 cycles Dose modification: 700 mg (original dose 700 mg, Cycle 5) Administration: 700 mg (08/04/2019) cyclophosphamide (CYTOXAN) 1,280 mg in sodium chloride 0.9 % 250 mL chemo infusion, 600 mg/m2 = 1,280 mg, Intravenous,  Once, 4 of 4 cycles Administration: 1,280 mg (05/12/2019), 1,280 mg (06/03/2019), 1,280 mg (06/23/2019), 1,280 mg (07/14/2019) PACLitaxel (TAXOL) 174 mg in sodium chloride 0.9 % 250 mL chemo infusion (</= 84mm2), 80 mg/m2 = 174 mg, Intravenous,  Once, 1 of 4 cycles Administration: 174 mg (08/04/2019), 174 mg (08/11/2019) fosaprepitant (EMEND) 150 mg in sodium chloride 0.9 % 145 mL IVPB, 150 mg, Intravenous,  Once, 5 of 8 cycles Administration: 150 mg (05/12/2019), 150 mg (08/04/2019), 150 mg (06/03/2019), 150 mg (06/23/2019), 150 mg (07/14/2019) pembrolizumab (KEYTRUDA) 200 mg in sodium chloride 0.9 % 50 mL chemo infusion, 2 mg/kg = 200 mg, Intravenous, Once, 5 of 8 cycles Administration: 200 mg (05/12/2019), 200 mg (06/03/2019), 200 mg (06/23/2019), 200 mg (07/14/2019), 200 mg (08/04/2019)  for chemotherapy treatment.     Genetic Testing   Negative genetic testing. No pathogenic variants identified. VUS in HOXB13 called c.473C>A, VUS in POLD1 called c.1610G>C, and VUS in RAD50 called c.1094G>A identified on the [facility] Common Hereditary Cancers Panel. The report date is 05/10/2019.  The Common Hereditary Cancers Panel offered by [facility] includes sequencing and/or deletion duplication testing of the following 48 genes: APC, ATM, AXIN2, BARD1, BMPR1A, BRCA1, BRCA2, BRIP1, CDH1, CDKN2A (p14ARF), CDKN2A (p16INK4a), CKD4, CHEK2, CTNNA1, DICER1, EPCAM (  Deletion/duplication testing only), GREM1 (promoter region deletion/duplication testing only), KIT, MEN1, MLH1, MSH2, MSH3, MSH6, MUTYH, NBN, NF1, NHTL1, PALB2, PDGFRA, PMS2, POLD1, POLE, PTEN, RAD50,  RAD51C, RAD51D, RNF43, SDHB, SDHC, SDHD, SMAD4, SMARCA4. STK11, TP53, TSC1, TSC2, and VHL.  The following genes were evaluated for sequence changes only: SDHA and HOXB13 c.251G>A variant only.     CHIEF COMPLIANT: Cycle 2 Taxol  INTERVAL HISTORY: Heather Mckenzie is a 39 y.o. 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 She is currently on neoadjuvant chemotherapy with weekly Taxol and Carboplatin every 3.She presents to the clinic todayfor cycle2.She was diagnosed with hypothyroidism and was started on 50 mcg of Synthroid.  TSH still remains elevated.  We are waiting on today's TSH level.  She received her first Taxol last week and tolerated fairly well.  She also received carboplatin.  Today she receives primarily Taxol.  Did not have any signs or symptoms of neuropathy.  ALLERGIES:  has No Known Allergies.  MEDICATIONS:  Current Outpatient Medications  Medication Sig Dispense Refill   fluconazole (DIFLUCAN) 100 MG tablet Take 1 tablet (100 mg total) by mouth daily. 7 tablet 0   ibuprofen (ADVIL,MOTRIN) 600 MG tablet Take 1 tablet (600 mg total) by mouth every 6 (six) hours as needed for pain. 30 tablet 2   levonorgestrel-ethinyl estradiol (AMETHYST) 90-20 MCG tablet Take 1 tablet by mouth daily. (28) 90 mcg-20 mcg tablet     levothyroxine (SYNTHROID) 50 MCG tablet Take 1 tablet (50 mcg total) by mouth daily before breakfast. 30 tablet 2   lidocaine-prilocaine (EMLA) cream Apply to affected area once 30 g 3   LORazepam (ATIVAN) 0.5 MG tablet Take 1 tablet (0.5 mg total) by mouth at bedtime as needed for sleep. 30 tablet 0   ondansetron (ZOFRAN) 8 MG tablet Take 1 tablet (8 mg total) by mouth 2 (two) times daily as needed. Start on the third day after chemotherapy. 30 tablet 1   prochlorperazine (COMPAZINE) 10 MG tablet Take 1 tablet (10 mg total) by mouth every 6 (six) hours as needed (Nausea or vomiting). 30 tablet 1   No current facility-administered  medications for this visit.   Facility-Administered Medications Ordered in Other Visits  Medication Dose Route Frequency Provider Last Rate Last Admin   sodium chloride flush (NS) 0.9 % injection 10 mL  10 mL Intracatheter PRN [name], MD   10 mL at 08/11/19 1439    PHYSICAL EXAMINATION: ECOG PERFORMANCE STATUS: 1 - Symptomatic but completely ambulatory  Vitals:   08/11/19 1130  BP: 124/80  Pulse: 90  Resp: 18  Temp: 98 F (36.7 C)  SpO2: 100%   Filed Weights   08/11/19 1130  Weight: 224 lb 14.4 oz (102 kg)    LABORATORY DATA:  I have reviewed the data as listed CMP Latest Ref Rng & Units 08/11/2019 08/04/2019 07/14/2019  Glucose 70 - 99 mg/dL 86 88 101(H)  BUN 6 - 20 mg/dL '16 9 13  ' Creatinine 0.44 - 1.00 mg/dL 0.81 0.83 0.84  Sodium 135 - 145 mmol/L 139 138 138  Potassium 3.5 - 5.1 mmol/L 4.2 4.3 4.2  Chloride 98 - 111 mmol/L 104 102 103  CO2 22 - 32 mmol/L '26 24 23  ' Calcium 8.9 - 10.3 mg/dL 9.1 9.2 9.5  Total Protein 6.5 - 8.1 g/dL 7.3 7.7 7.4  Total Bilirubin 0.3 - 1.2 mg/dL 0.4 0.3 0.2(L)  Alkaline Phos 38 - 126 U/L 103 139(H) 111  AST 15 - 41 U/L 28  24 25  ALT 0 - 44 U/L '22 19 18    ' Lab Results  Component Value Date   WBC 6.6 08/11/2019   HGB 10.4 (L) 08/11/2019   HCT 32.2 (L) 08/11/2019   MCV 92.8 08/11/2019   PLT 359 08/11/2019   NEUTROABS 4.6 08/11/2019    ASSESSMENT & PLAN:  Malignant neoplasm of overlapping sites of left breast in female, estrogen receptor negative ([place]) 04/25/2019:Patient palpated a left breast and axillary mass x46month. [place] left breast showed a 3.6cm mass at the 1 o'clock position with 6 smaller adjacent masses extending toward the nipple ranging in size from 1.1cm to 4.0cm. [place] the left axilla showed five bulky lymph nodes, the largest measuring 4.2cm, and second largest measuring 2.8cm. 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 T1c N2 stage IIIc Based on PET CT scan showing  bilateral cervical nodes, it is stage IV(however her goal is to cure given the oligometastatic nature of her cancer.)  PET CT scan 25/44/9201 Hypermetabolic nodules in the left breast and left axilla and subpectoral adenopathy. Small hypermetabolic lymph nodes in the neck. Marrow activity from G-CSF, thyroid activity possibly from immunotherapy.  MRI of the liver and LN: decided to observe after much   Treatment plan: 1. Neoadjuvant chemotherapy with Adriamycin and Cytoxanevery 3 weeks4 followed byTaxolweekly 12with carboplatin every 3 weeks and based on keynote 522 clinical trial we will add pembrolizumab every 3 weeks(8 cycles every 3 weeks) After she finishes neoadjuvant chemoappearing sternal lesions. Go on pembrolizumab maintenance every 3 weeks for 9 more cycles 2. Followed bymastectomy andaxillary lymph nodedissection 3. Followed by adjuvant radiation therapy ----------------------------------------------------------------------------------------------------------------------------------------------------- Current treatment: Completed 4 cycles of Adriamycin and Cytoxan with pembrolizumab, today is cycle 1 Taxol with carboplatin and pembrolizumab.  Taxol is weekly while cDjiboutiare every 3 weeks  Chemo toxicities: 1.Nausea: Mild 2.Oral thrush: Resolved 3.  Immune mediated adverse effect: Hypothyroidism: Currently on 50 mcg of Synthroid.  We may have to increase the dosage based on today's TSH level. She notes her breast cancer is much improved and she has difficulty palpating any disease.  I placed an order for an ultrasound to evaluate for the mass, or left axillary adenopathy to establish a new baseline prior to starting the Taxol/Carbo.    Breast ultrasound 08/02/2019: Overall there is a decrease in size of the bulk of the masses.  The lymph nodes have returned to normal size. Return to clinic in weekly for chemo and every other week for follow-up with  me.    No orders of the defined types were placed in this encounter.  The patient has a good understanding of the overall plan. she agrees with it. she will call with any problems that may develop before the next visit here.  Total time spent: 30 mins including face to face time and time spent for planning, charting and coordination of care  GNicholas Lose MD 08/11/2019  I, MCloyde ReamsDorshimer, am acting as scribe for Dr. VNicholas Lose  I have reviewed the above documentation for accuracy and completeness, and I agree with the above.

## 2019-08-11 ENCOUNTER — Inpatient Hospital Stay: Payer: 59

## 2019-08-11 ENCOUNTER — Other Ambulatory Visit: Payer: Self-pay

## 2019-08-11 ENCOUNTER — Inpatient Hospital Stay (HOSPITAL_BASED_OUTPATIENT_CLINIC_OR_DEPARTMENT_OTHER): Payer: 59 | Admitting: Hematology and Oncology

## 2019-08-11 DIAGNOSIS — Z95828 Presence of other vascular implants and grafts: Secondary | ICD-10-CM

## 2019-08-11 DIAGNOSIS — C50812 Malignant neoplasm of overlapping sites of left female breast: Secondary | ICD-10-CM

## 2019-08-11 DIAGNOSIS — Z171 Estrogen receptor negative status [ER-]: Secondary | ICD-10-CM

## 2019-08-11 DIAGNOSIS — Z5112 Encounter for antineoplastic immunotherapy: Secondary | ICD-10-CM | POA: Diagnosis not present

## 2019-08-11 LAB — CBC WITH DIFFERENTIAL (CANCER CENTER ONLY)
Abs Immature Granulocytes: 0.06 10*3/uL (ref 0.00–0.07)
Basophils Absolute: 0.2 10*3/uL — ABNORMAL HIGH (ref 0.0–0.1)
Basophils Relative: 2 %
Eosinophils Absolute: 0.1 10*3/uL (ref 0.0–0.5)
Eosinophils Relative: 2 %
HCT: 32.2 % — ABNORMAL LOW (ref 36.0–46.0)
Hemoglobin: 10.4 g/dL — ABNORMAL LOW (ref 12.0–15.0)
Immature Granulocytes: 1 %
Lymphocytes Relative: 17 %
Lymphs Abs: 1.1 10*3/uL (ref 0.7–4.0)
MCH: 30 pg (ref 26.0–34.0)
MCHC: 32.3 g/dL (ref 30.0–36.0)
MCV: 92.8 fL (ref 80.0–100.0)
Monocytes Absolute: 0.6 10*3/uL (ref 0.1–1.0)
Monocytes Relative: 9 %
Neutro Abs: 4.6 10*3/uL (ref 1.7–7.7)
Neutrophils Relative %: 69 %
Platelet Count: 359 10*3/uL (ref 150–400)
RBC: 3.47 MIL/uL — ABNORMAL LOW (ref 3.87–5.11)
RDW: 18.7 % — ABNORMAL HIGH (ref 11.5–15.5)
WBC Count: 6.6 10*3/uL (ref 4.0–10.5)
nRBC: 0 % (ref 0.0–0.2)

## 2019-08-11 LAB — CMP (CANCER CENTER ONLY)
ALT: 22 U/L (ref 0–44)
AST: 28 U/L (ref 15–41)
Albumin: 3.8 g/dL (ref 3.5–5.0)
Alkaline Phosphatase: 103 U/L (ref 38–126)
Anion gap: 9 (ref 5–15)
BUN: 16 mg/dL (ref 6–20)
CO2: 26 mmol/L (ref 22–32)
Calcium: 9.1 mg/dL (ref 8.9–10.3)
Chloride: 104 mmol/L (ref 98–111)
Creatinine: 0.81 mg/dL (ref 0.44–1.00)
GFR, Est AFR Am: >60 mL/min (ref >60)
GFR, Estimated: >60 mL/min (ref >60)
Glucose, Bld: 86 mg/dL (ref 70–99)
Potassium: 4.2 mmol/L (ref 3.5–5.1)
Sodium: 139 mmol/L (ref 135–145)
Total Bilirubin: 0.4 mg/dL (ref 0.3–1.2)
Total Protein: 7.3 g/dL (ref 6.5–8.1)

## 2019-08-11 MED ORDER — FAMOTIDINE IN NACL 20-0.9 MG/50ML-% IV SOLN
20.0000 mg | Freq: Once | INTRAVENOUS | Status: AC
  Administered 2019-08-11: 20 mg via INTRAVENOUS

## 2019-08-11 MED ORDER — HEPARIN SOD (PORK) LOCK FLUSH 100 UNIT/ML IV SOLN
500.0000 [IU] | Freq: Once | INTRAVENOUS | Status: AC | PRN
  Administered 2019-08-11: 500 [IU]
  Filled 2019-08-11: qty 5

## 2019-08-11 MED ORDER — SODIUM CHLORIDE 0.9% FLUSH
10.0000 mL | INTRAVENOUS | Status: DC | PRN
  Administered 2019-08-11: 10 mL
  Filled 2019-08-11: qty 10

## 2019-08-11 MED ORDER — FAMOTIDINE IN NACL 20-0.9 MG/50ML-% IV SOLN
INTRAVENOUS | Status: AC
  Filled 2019-08-11: qty 50

## 2019-08-11 MED ORDER — SODIUM CHLORIDE 0.9 % IV SOLN
20.0000 mg | Freq: Once | INTRAVENOUS | Status: AC
  Administered 2019-08-11: 20 mg via INTRAVENOUS
  Filled 2019-08-11: qty 20

## 2019-08-11 MED ORDER — DIPHENHYDRAMINE HCL 50 MG/ML IJ SOLN
INTRAMUSCULAR | Status: AC
  Filled 2019-08-11: qty 1

## 2019-08-11 MED ORDER — DIPHENHYDRAMINE HCL 50 MG/ML IJ SOLN
25.0000 mg | Freq: Once | INTRAMUSCULAR | Status: AC
  Administered 2019-08-11: 12:00:00 25 mg via INTRAVENOUS

## 2019-08-11 MED ORDER — SODIUM CHLORIDE 0.9 % IV SOLN
Freq: Once | INTRAVENOUS | Status: AC
  Filled 2019-08-11: qty 250

## 2019-08-11 MED ORDER — SODIUM CHLORIDE 0.9 % IV SOLN
80.0000 mg/m2 | Freq: Once | INTRAVENOUS | Status: AC
  Administered 2019-08-11: 174 mg via INTRAVENOUS
  Filled 2019-08-11: qty 29

## 2019-08-11 NOTE — Assessment & Plan Note (Signed)
04/25/2019:Patient palpated a left breast and axillary mass x72month. [place] the left breast showed a 3.6cm mass at the 1 o'clock position with 6 smaller adjacent masses extending toward the nipple ranging in size from 1.1cm to 4.0cm. [place] the left axilla showed five bulky lymph nodes, the largest measuring 4.2cm, and second largest measuring 2.8cm. 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 T1c N2 stage IIIc Based on PET CT scan showing bilateral cervical nodes, it is stage IV(however her goal is to cure given the oligometastatic nature of her cancer.)  PET CT scan 29/97/7414 Hypermetabolic nodules in the left breast and left axilla and subpectoral adenopathy. Small hypermetabolic lymph nodes in the neck. Marrow activity from G-CSF, thyroid activity possibly from immunotherapy.  MRI of the liver and LN: decided to observe after much   Treatment plan: 1. Neoadjuvant chemotherapy with Adriamycin and Cytoxanevery 3 weeks4 followed byTaxolweekly 12with carboplatin every 3 weeks and based on keynote 522 clinical trial we will add pembrolizumab every 3 weeks(8 cycles every 3 weeks) After she finishes neoadjuvant chemoappearing sternal lesions. Go on pembrolizumab maintenance every 3 weeks for 9 more cycles 2. Followed bymastectomy andaxillary lymph nodedissection 3. Followed by adjuvant radiation therapy ----------------------------------------------------------------------------------------------------------------------------------------------------- Current treatment: Completed 4 cycles of Adriamycin and Cytoxan with pembrolizumab, today is cycle 1 Taxol with carboplatin and pembrolizumab.  Taxol is weekly while cDjiboutiare every 3 weeks  Chemo toxicities: 1.Nausea: Mild 2.Oral thrush: Resolved  She notes her breast cancer is much improved and she has difficulty palpating any disease.  I placed an order for an ultrasound  to evaluate for the mass, or left axillary adenopathy to establish a new baseline prior to starting the Taxol/Carbo.    Breast ultrasound 08/02/2019: Overall there is a decrease in size of the bulk of the masses.  The lymph nodes have returned to normal size.  Return to clinic in 1 week for toxicity evaluation.   Return to clinic in 3 weeks for labs, f/u with Dr. GLindi Adie and Taxol/Carbo/Pembrolizumab

## 2019-08-11 NOTE — Patient Instructions (Signed)
[facility] Discharge Instructions for Patients Receiving Chemotherapy  Today you received the following chemotherapy agents:  Taxol.  To help prevent nausea and vomiting after your treatment, we encourage you to take your nausea medication as directed.   If you develop nausea and vomiting that is not controlled by your nausea medication, call the clinic.   BELOW ARE SYMPTOMS THAT SHOULD BE REPORTED IMMEDIATELY:  *FEVER GREATER THAN 100.5 F  *CHILLS WITH OR WITHOUT FEVER  NAUSEA AND VOMITING THAT IS NOT CONTROLLED WITH YOUR NAUSEA MEDICATION  *UNUSUAL SHORTNESS OF BREATH  *UNUSUAL BRUISING OR BLEEDING  TENDERNESS IN MOUTH AND THROAT WITH OR WITHOUT PRESENCE OF ULCERS  *URINARY PROBLEMS  *BOWEL PROBLEMS  UNUSUAL RASH Items with * indicate a potential emergency and should be followed up as soon as possible.  Feel free to call the clinic should you have any questions or concerns. The clinic phone number is [phone].  Please show the CHEMO ALERT CARD at check-in to the Emergency Department and triage nurse.   

## 2019-08-12 ENCOUNTER — Other Ambulatory Visit: Payer: Self-pay | Admitting: Hematology and Oncology

## 2019-08-12 DIAGNOSIS — C50812 Malignant neoplasm of overlapping sites of left female breast: Secondary | ICD-10-CM

## 2019-08-12 DIAGNOSIS — E039 Hypothyroidism, unspecified: Secondary | ICD-10-CM

## 2019-08-12 DIAGNOSIS — Z171 Estrogen receptor negative status [ER-]: Secondary | ICD-10-CM

## 2019-08-12 LAB — THYROID PANEL WITH TSH
Free Thyroxine Index: 0.3 — ABNORMAL LOW (ref 1.2–4.9)
T3 Uptake Ratio: 12 % — ABNORMAL LOW (ref 24–39)
T4, Total: 2.2 ug/dL — ABNORMAL LOW (ref 4.5–12.0)
TSH: 66.7 u[IU]/mL — ABNORMAL HIGH (ref 0.450–4.500)

## 2019-08-12 MED ORDER — LEVOTHYROXINE SODIUM 88 MCG PO TABS: 88.0000 ug | ORAL_TABLET | Freq: Every day | ORAL | 1 refills | Status: DC

## 2019-08-12 NOTE — Progress Notes (Signed)
TSH still 66. Will increase dose of Synthroid to 88 mcg Recheck TSH with next labs. I left her a voice mail.

## 2019-08-12 NOTE — Progress Notes (Signed)
Pharmacist Chemotherapy Monitoring - Follow Up Assessment    I verify that I have reviewed each item in the below checklist:  . Regimen for the patient is scheduled for the appropriate day and plan matches scheduled date. Marland Kitchen Appropriate non-routine labs are ordered dependent on drug ordered. . If applicable, additional medications reviewed and ordered per protocol based on lifetime cumulative doses and/or treatment regimen.   Plan for follow-up and/or issues identified: No . I-vent associated with next due treatment: No . MD and/or nursing notified: No  Philomena Course 08/12/2019 11:16 AM

## 2019-08-17 MED FILL — Dexamethasone Sodium Phosphate Inj 100 MG/10ML: INTRAMUSCULAR | Qty: 2 | Status: AC

## 2019-08-18 ENCOUNTER — Encounter: Payer: Self-pay | Admitting: *Deleted

## 2019-08-18 ENCOUNTER — Inpatient Hospital Stay: Payer: 59

## 2019-08-18 ENCOUNTER — Inpatient Hospital Stay: Payer: 59 | Attending: Hematology and Oncology

## 2019-08-18 ENCOUNTER — Other Ambulatory Visit: Payer: Self-pay

## 2019-08-18 VITALS — BP 106/72 | HR 110 | Temp 97.8°F | Resp 18

## 2019-08-18 DIAGNOSIS — Z5112 Encounter for antineoplastic immunotherapy: Secondary | ICD-10-CM | POA: Insufficient documentation

## 2019-08-18 DIAGNOSIS — Z95828 Presence of other vascular implants and grafts: Secondary | ICD-10-CM

## 2019-08-18 DIAGNOSIS — D701 Agranulocytosis secondary to cancer chemotherapy: Secondary | ICD-10-CM | POA: Diagnosis not present

## 2019-08-18 DIAGNOSIS — Z5189 Encounter for other specified aftercare: Secondary | ICD-10-CM | POA: Insufficient documentation

## 2019-08-18 DIAGNOSIS — Z79899 Other long term (current) drug therapy: Secondary | ICD-10-CM | POA: Insufficient documentation

## 2019-08-18 DIAGNOSIS — C50812 Malignant neoplasm of overlapping sites of left female breast: Secondary | ICD-10-CM | POA: Diagnosis present

## 2019-08-18 DIAGNOSIS — Z452 Encounter for adjustment and management of vascular access device: Secondary | ICD-10-CM | POA: Insufficient documentation

## 2019-08-18 DIAGNOSIS — Z171 Estrogen receptor negative status [ER-]: Secondary | ICD-10-CM | POA: Insufficient documentation

## 2019-08-18 DIAGNOSIS — Z5111 Encounter for antineoplastic chemotherapy: Secondary | ICD-10-CM | POA: Diagnosis present

## 2019-08-18 LAB — CBC WITH DIFFERENTIAL (CANCER CENTER ONLY)
Abs Immature Granulocytes: 0.01 10*3/uL (ref 0.00–0.07)
Basophils Absolute: 0.1 10*3/uL (ref 0.0–0.1)
Basophils Relative: 5 %
Eosinophils Absolute: 0.1 10*3/uL (ref 0.0–0.5)
Eosinophils Relative: 4 %
HCT: 30.1 % — ABNORMAL LOW (ref 36.0–46.0)
Hemoglobin: 9.7 g/dL — ABNORMAL LOW (ref 12.0–15.0)
Immature Granulocytes: 1 %
Lymphocytes Relative: 34 %
Lymphs Abs: 0.4 10*3/uL — ABNORMAL LOW (ref 0.7–4.0)
MCH: 30.6 pg (ref 26.0–34.0)
MCHC: 32.2 g/dL (ref 30.0–36.0)
MCV: 95 fL (ref 80.0–100.0)
Monocytes Absolute: 0.2 10*3/uL (ref 0.1–1.0)
Monocytes Relative: 15 %
Neutro Abs: 0.5 10*3/uL — ABNORMAL LOW (ref 1.7–7.7)
Neutrophils Relative %: 41 %
Platelet Count: 178 10*3/uL (ref 150–400)
RBC: 3.17 MIL/uL — ABNORMAL LOW (ref 3.87–5.11)
RDW: 19 % — ABNORMAL HIGH (ref 11.5–15.5)
WBC Count: 1.2 10*3/uL — ABNORMAL LOW (ref 4.0–10.5)
nRBC: 0 % (ref 0.0–0.2)

## 2019-08-18 LAB — CMP (CANCER CENTER ONLY)
ALT: 22 U/L (ref 0–44)
AST: 33 U/L (ref 15–41)
Albumin: 3.7 g/dL (ref 3.5–5.0)
Alkaline Phosphatase: 91 U/L (ref 38–126)
Anion gap: 13 (ref 5–15)
BUN: 10 mg/dL (ref 6–20)
CO2: 24 mmol/L (ref 22–32)
Calcium: 9.3 mg/dL (ref 8.9–10.3)
Chloride: 101 mmol/L (ref 98–111)
Creatinine: 0.82 mg/dL (ref 0.44–1.00)
GFR, Est AFR Am: >60 mL/min (ref >60)
GFR, Estimated: >60 mL/min (ref >60)
Glucose, Bld: 100 mg/dL — ABNORMAL HIGH (ref 70–99)
Potassium: 4 mmol/L (ref 3.5–5.1)
Sodium: 138 mmol/L (ref 135–145)
Total Bilirubin: 0.5 mg/dL (ref 0.3–1.2)
Total Protein: 7.4 g/dL (ref 6.5–8.1)

## 2019-08-18 MED ORDER — HEPARIN SOD (PORK) LOCK FLUSH 100 UNIT/ML IV SOLN
500.0000 [IU] | Freq: Once | INTRAVENOUS | Status: AC | PRN
  Administered 2019-08-18: 500 [IU]
  Filled 2019-08-18: qty 5

## 2019-08-18 MED ORDER — SODIUM CHLORIDE 0.9% FLUSH
10.0000 mL | INTRAVENOUS | Status: DC | PRN
  Filled 2019-08-18: qty 10

## 2019-08-18 MED ORDER — SODIUM CHLORIDE 0.9% FLUSH
10.0000 mL | INTRAVENOUS | Status: DC | PRN
  Administered 2019-08-18 (×2): 10 mL
  Filled 2019-08-18: qty 10

## 2019-08-18 NOTE — Progress Notes (Signed)
Pt with ANC of 0.5, and HR of 110.  Pt here for Taxol, cycle 3.  MD notified - Hold treatment today, pt will continue with follow up next week with labs/MD.  Infusion nurse notified.

## 2019-08-19 ENCOUNTER — Other Ambulatory Visit: Payer: Self-pay | Admitting: Hematology and Oncology

## 2019-08-19 DIAGNOSIS — E039 Hypothyroidism, unspecified: Secondary | ICD-10-CM

## 2019-08-19 DIAGNOSIS — Z171 Estrogen receptor negative status [ER-]: Secondary | ICD-10-CM

## 2019-08-19 LAB — THYROID PANEL WITH TSH
Free Thyroxine Index: 0.5 — ABNORMAL LOW (ref 1.2–4.9)
T3 Uptake Ratio: 11 % — ABNORMAL LOW (ref 24–39)
T4, Total: 4.5 ug/dL (ref 4.5–12.0)
TSH: 60.1 u[IU]/mL — ABNORMAL HIGH (ref 0.450–4.500)

## 2019-08-19 MED ORDER — LEVOTHYROXINE SODIUM 112 MCG PO TABS: 112.0000 ug | ORAL_TABLET | Freq: Every day | ORAL | 0 refills | Status: DC

## 2019-08-19 NOTE — Progress Notes (Signed)
Increase the dosage of Synthroid to 112 mcg

## 2019-08-19 NOTE — Progress Notes (Signed)
Pharmacist Chemotherapy Monitoring - Follow Up Assessment    I verify that I have reviewed each item in the below checklist:  . Regimen for the patient is scheduled for the appropriate day and plan matches scheduled date. Marland Kitchen Appropriate non-routine labs are ordered dependent on drug ordered. . If applicable, additional medications reviewed and ordered per protocol based on lifetime cumulative doses and/or treatment regimen.   Plan for follow-up and/or issues identified: Yes . I-vent associated with next due treatment: Yes . MD and/or nursing notified: Yes  Seraiah Nowack D 08/19/2019 1:54 PM

## 2019-08-24 NOTE — Progress Notes (Signed)
Patient Care Team: Patient, No Pcp Per as PCP - General (General Practice) [name], RN as Oncology Nurse Navigator [name], RN as Oncology Nurse Navigator [name], MD as Consulting Physician (General Surgery) [name], MD as Consulting Physician (Hematology and Oncology) [name], MD as Consulting Physician (Radiation Oncology)  DIAGNOSIS:    ICD-10-CM   1. Malignant neoplasm of overlapping sites of left breast in female, estrogen receptor negative ([place])  C50.812    Z17.1     SUMMARY OF ONCOLOGIC HISTORY: Oncology History  Malignant neoplasm of overlapping sites of left breast in female, estrogen receptor negative ([place])  04/25/2019 Initial Diagnosis   Patient palpated a left breast and axillary mass x21month. [place] left breast showed a 3.6cm mass at the 1 o'clock position with 6 smaller adjacent masses extending toward the nipple ranging in size from 1.1cm to 4.0cm. [place] the left axilla showed five bulky lymph nodes, the largest measuring 4.2cm, and second largest measuring 2.8cm. 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    04/27/2019 Cancer Staging   Staging form: Breast, AJCC 8th Edition - Clinical: Stage IIIC (cT1c, cN2a, cM0, G3, ER-, PR-, HER2-) - Signed by GNicholas Lose MD on 04/27/2019   05/12/2019 -  Chemotherapy   The patient had DOXOrubicin (ADRIAMYCIN) chemo injection 128 mg, 60 mg/m2 = 128 mg, Intravenous,  Once, 4 of 4 cycles Administration: 128 mg (05/12/2019), 128 mg (06/03/2019), 128 mg (06/23/2019), 128 mg (07/14/2019) palonosetron (ALOXI) injection 0.25 mg, 0.25 mg, Intravenous,  Once, 5 of 8 cycles Administration: 0.25 mg (05/12/2019), 0.25 mg (08/04/2019), 0.25 mg (06/03/2019), 0.25 mg (06/23/2019), 0.25 mg (07/14/2019) pegfilgrastim-cbqv (UDENYCA) injection 6 mg, 6 mg, Subcutaneous, Once, 4 of 4 cycles Administration: 6 mg (05/14/2019), 6 mg (06/06/2019), 6 mg (06/25/2019), 6 mg  (07/16/2019) CARBOplatin (PARAPLATIN) 700 mg in sodium chloride 0.9 % 250 mL chemo infusion, 700 mg (100 % of original dose 700 mg), Intravenous,  Once, 1 of 4 cycles Dose modification: 700 mg (original dose 700 mg, Cycle 5) Administration: 700 mg (08/04/2019) cyclophosphamide (CYTOXAN) 1,280 mg in sodium chloride 0.9 % 250 mL chemo infusion, 600 mg/m2 = 1,280 mg, Intravenous,  Once, 4 of 4 cycles Administration: 1,280 mg (05/12/2019), 1,280 mg (06/03/2019), 1,280 mg (06/23/2019), 1,280 mg (07/14/2019) PACLitaxel (TAXOL) 174 mg in sodium chloride 0.9 % 250 mL chemo infusion (</= 870mm2), 80 mg/m2 = 174 mg, Intravenous,  Once, 1 of 4 cycles Administration: 174 mg (08/04/2019), 174 mg (08/11/2019) fosaprepitant (EMEND) 150 mg in sodium chloride 0.9 % 145 mL IVPB, 150 mg, Intravenous,  Once, 5 of 8 cycles Administration: 150 mg (05/12/2019), 150 mg (08/04/2019), 150 mg (06/03/2019), 150 mg (06/23/2019), 150 mg (07/14/2019) pembrolizumab (KEYTRUDA) 200 mg in sodium chloride 0.9 % 50 mL chemo infusion, 2 mg/kg = 200 mg, Intravenous, Once, 5 of 8 cycles Administration: 200 mg (05/12/2019), 200 mg (06/03/2019), 200 mg (06/23/2019), 200 mg (07/14/2019), 200 mg (08/04/2019)  for chemotherapy treatment.     Genetic Testing   Negative genetic testing. No pathogenic variants identified. VUS in HOXB13 called c.473C>A, VUS in POLD1 called c.1610G>C, and VUS in RAD50 called c.1094G>A identified on the [facility] Common Hereditary Cancers Panel. The report date is 05/10/2019.  The Common Hereditary Cancers Panel offered by [facility] includes sequencing and/or deletion duplication testing of the following 48 genes: APC, ATM, AXIN2, BARD1, BMPR1A, BRCA1, BRCA2, BRIP1, CDH1, CDKN2A (p14ARF), CDKN2A (p16INK4a), CKD4, CHEK2, CTNNA1, DICER1, EPCAM (  Deletion/duplication testing only), GREM1 (promoter region deletion/duplication testing only), KIT, MEN1, MLH1, MSH2, MSH3, MSH6, MUTYH, NBN, NF1, NHTL1, PALB2, PDGFRA, PMS2, POLD1, POLE, PTEN, RAD50,  RAD51C, RAD51D, RNF43, SDHB, SDHC, SDHD, SMAD4, SMARCA4. STK11, TP53, TSC1, TSC2, and VHL.  The following genes were evaluated for sequence changes only: SDHA and HOXB13 c.251G>A variant only.     CHIEF COMPLIANT: Cycle 3 Taxol   INTERVAL HISTORY: Heather Mckenzie is a 39 y.o. 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 She is currently on neoadjuvant chemotherapy with weekly Taxol and Carboplatin every 3 weeks.Cycle 3 was held last week as labs showed ANC 0.5. She presents to the clinic todayfor cycle3. She denies any nausea vomiting or constipation or diarrhea.  Denies neuropathy. Patient experienced tachycardia and increased sweating because of Synthroid replacement therapy.  ALLERGIES:  has No Known Allergies.  MEDICATIONS:  Current Outpatient Medications  Medication Sig Dispense Refill  . fluconazole (DIFLUCAN) 100 MG tablet Take 1 tablet (100 mg total) by mouth daily. 7 tablet 0  . ibuprofen (ADVIL,MOTRIN) 600 MG tablet Take 1 tablet (600 mg total) by mouth every 6 (six) hours as needed for pain. 30 tablet 2  . levonorgestrel-ethinyl estradiol (AMETHYST) 90-20 MCG tablet Take 1 tablet by mouth daily. (28) 90 mcg-20 mcg tablet    . levothyroxine (SYNTHROID) 112 MCG tablet Take 1 tablet (112 mcg total) by mouth daily before breakfast. 14 tablet 0  . lidocaine-prilocaine (EMLA) cream Apply to affected area once 30 g 3  . LORazepam (ATIVAN) 0.5 MG tablet Take 1 tablet (0.5 mg total) by mouth at bedtime as needed for sleep. 30 tablet 0  . ondansetron (ZOFRAN) 8 MG tablet Take 1 tablet (8 mg total) by mouth 2 (two) times daily as needed. Start on the third day after chemotherapy. 30 tablet 1  . prochlorperazine (COMPAZINE) 10 MG tablet Take 1 tablet (10 mg total) by mouth every 6 (six) hours as needed (Nausea or vomiting). 30 tablet 1   No current facility-administered medications for this visit.    PHYSICAL EXAMINATION: ECOG PERFORMANCE STATUS: 1 - Symptomatic but  completely ambulatory  Vitals:   08/25/19 1039  BP: 131/86  Pulse: 96  Resp: 18  Temp: 98.3 F (36.8 C)  SpO2: 100%   Filed Weights   08/25/19 1039  Weight: 226 lb 11.2 oz (102.8 kg)    LABORATORY DATA:  I have reviewed the data as listed CMP Latest Ref Rng & Units 08/18/2019 08/11/2019 08/04/2019  Glucose 70 - 99 mg/dL 100(H) 86 88  BUN 6 - 20 mg/dL _0 Creatinine 0.44 - 1.00 mg/dL 0.82 0.81 0.83  Sodium 135 - 145 mmol/L 138 139 138  Potassium 3.5 - 5.1 mmol/L 4.0 4.2 4.3  Chloride 98 - 111 mmol/L 101 104 102  CO2 22 - 32 mmol/L _1 Calcium 8.9 - 10.3 mg/dL 9.3 9.1 9.2  Total Protein 6.5 - 8.1 g/dL 7.4 7.3 7.7  Total Bilirubin 0.3 - 1.2 mg/dL 0.5 0.4 0.3  Alkaline Phos 38 - 126 U/L 91 103 139(H)  AST 15 - 41 U/L 33 28 24  ALT 0 - 44 U/L _2 Lab Results  Component Value Date   WBC 4.3 08/25/2019   HGB 9.8 (L) 08/25/2019   HCT 31.4 (L) 08/25/2019   MCV 95.7 08/25/2019   PLT 277 08/25/2019   NEUTROABS 1.0 (L) 08/25/2019    ASSESSMENT & PLAN:  Malignant neoplasm of overlapping sites of  left breast in female, estrogen receptor negative ([place]) 04/25/2019:Patient palpated a left breast and axillary mass x106month. [place] left breast showed a 3.6cm mass at the 1 o'clock position with 6 smaller adjacent masses extending toward the nipple ranging in size from 1.1cm to 4.0cm. [place] the left axilla showed five bulky lymph nodes, the largest measuring 4.2cm, and second largest measuring 2.8cm. 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 T1c N2 stage IIIc Based on PET CT scan showing bilateral cervical nodes, it is stage IV(however her goal is to cure given the oligometastatic nature of her cancer.)  PET CT scan 24/50/3888 Hypermetabolic nodules in the left breast and left axilla and subpectoral adenopathy. Small hypermetabolic lymph nodes in the neck. Marrow activity from G-CSF, thyroid activity possibly from  immunotherapy.  MRI of the liver and LN: decided to observe after much   Treatment plan: 1. Neoadjuvant chemotherapy with Adriamycin and Cytoxanevery 3 weeks4 followed byTaxolweekly 12with carboplatin every 3 weeks and based on keynote 522 clinical trial we will add pembrolizumab every 3 weeks(8 cycles every 3 weeks) After she finishes neoadjuvant chemoappearing sternal lesions.  pembrolizumab maintenance every 3 weeks for 9 more cycles 2. Followed bymastectomy andaxillary lymph nodedissection 3. Followed by adjuvant radiation therapy ----------------------------------------------------------------------------------------------------------------------------------------------------- Current treatment: Completed 4 cycles of Adriamycin and Cytoxan with pembrolizumab, today is cycle 3 Taxol (with carboplatin and pembrolizumab every 3 weeks).   Chemo toxicities: 1.Nausea: Mild 2.Oral thrush: Resolved 3.  Immune mediated adverse effect: Hypothyroidism: Patient experienced symptoms of hyperthyroidism on the 88 mcg dose of Synthroid.  Therefore we will encourage her to take 50 mcg 4 days a week and 88 on 3 days a week.    4.  Neutropenia: We will reduce the dosage of Taxol to 65 mg per metered squared with cycle 3.  I would like to give her a dose of Granix on Tuesdays prior to each treatment.   Breast ultrasound 08/02/2019: Overall there is a decrease in size of the bulk of the masses.  The lymph nodes have returned to normal size.  Ultrasound revealed a 5 cm x 1.1 x 3 cm previously it was 3.6 x 2.4 x 2 cm.  (Radiologist felt that the apparent increase in the size can be attributed to treatment changes of the appearance of the tissue) We will discuss her case in tumor board.  I do believe that she is responding to treatment.  Return to clinic weekly for chemo and every other week for follow-up with me.     No orders of the defined types were placed in this encounter.  The  patient has a good understanding of the overall plan. she agrees with it. she will call with any problems that may develop before the next visit here.  Total time spent: 30 mins including face to face time and time spent for planning, charting and coordination of care  GNicholas Lose MD 08/25/2019  I, MCloyde ReamsDorshimer, am acting as scribe for Dr. VNicholas Lose  I have reviewed the above documentation for accuracy and completeness, and I agree with the above.

## 2019-08-25 ENCOUNTER — Inpatient Hospital Stay: Payer: 59

## 2019-08-25 ENCOUNTER — Inpatient Hospital Stay (HOSPITAL_BASED_OUTPATIENT_CLINIC_OR_DEPARTMENT_OTHER): Payer: 59 | Admitting: Hematology and Oncology

## 2019-08-25 ENCOUNTER — Other Ambulatory Visit: Payer: Self-pay

## 2019-08-25 DIAGNOSIS — E039 Hypothyroidism, unspecified: Secondary | ICD-10-CM | POA: Diagnosis not present

## 2019-08-25 DIAGNOSIS — C50812 Malignant neoplasm of overlapping sites of left female breast: Secondary | ICD-10-CM | POA: Diagnosis not present

## 2019-08-25 DIAGNOSIS — Z171 Estrogen receptor negative status [ER-]: Secondary | ICD-10-CM

## 2019-08-25 DIAGNOSIS — Z95828 Presence of other vascular implants and grafts: Secondary | ICD-10-CM

## 2019-08-25 DIAGNOSIS — Z5112 Encounter for antineoplastic immunotherapy: Secondary | ICD-10-CM | POA: Diagnosis not present

## 2019-08-25 LAB — CBC WITH DIFFERENTIAL (CANCER CENTER ONLY)
Abs Immature Granulocytes: 0.05 10*3/uL (ref 0.00–0.07)
Basophils Absolute: 0.1 10*3/uL (ref 0.0–0.1)
Basophils Relative: 1 %
Eosinophils Absolute: 0.1 10*3/uL (ref 0.0–0.5)
Eosinophils Relative: 2 %
HCT: 31.4 % — ABNORMAL LOW (ref 36.0–46.0)
Hemoglobin: 9.8 g/dL — ABNORMAL LOW (ref 12.0–15.0)
Immature Granulocytes: 1 %
Lymphocytes Relative: 56 %
Lymphs Abs: 2.4 10*3/uL (ref 0.7–4.0)
MCH: 29.9 pg (ref 26.0–34.0)
MCHC: 31.2 g/dL (ref 30.0–36.0)
MCV: 95.7 fL (ref 80.0–100.0)
Monocytes Absolute: 0.7 10*3/uL (ref 0.1–1.0)
Monocytes Relative: 17 %
Neutro Abs: 1 10*3/uL — ABNORMAL LOW (ref 1.7–7.7)
Neutrophils Relative %: 23 %
Platelet Count: 277 10*3/uL (ref 150–400)
RBC: 3.28 MIL/uL — ABNORMAL LOW (ref 3.87–5.11)
RDW: 18.2 % — ABNORMAL HIGH (ref 11.5–15.5)
WBC Count: 4.3 10*3/uL (ref 4.0–10.5)
nRBC: 0 % (ref 0.0–0.2)

## 2019-08-25 LAB — CMP (CANCER CENTER ONLY)
ALT: 33 U/L (ref 0–44)
AST: 34 U/L (ref 15–41)
Albumin: 3.7 g/dL (ref 3.5–5.0)
Alkaline Phosphatase: 99 U/L (ref 38–126)
Anion gap: 10 (ref 5–15)
BUN: 10 mg/dL (ref 6–20)
CO2: 24 mmol/L (ref 22–32)
Calcium: 9.1 mg/dL (ref 8.9–10.3)
Chloride: 105 mmol/L (ref 98–111)
Creatinine: 0.83 mg/dL (ref 0.44–1.00)
GFR, Est AFR Am: >60 mL/min (ref >60)
GFR, Estimated: >60 mL/min (ref >60)
Glucose, Bld: 93 mg/dL (ref 70–99)
Potassium: 4.2 mmol/L (ref 3.5–5.1)
Sodium: 139 mmol/L (ref 135–145)
Total Bilirubin: 0.3 mg/dL (ref 0.3–1.2)
Total Protein: 7.1 g/dL (ref 6.5–8.1)

## 2019-08-25 MED ORDER — SODIUM CHLORIDE 0.9 % IV SOLN
20.0000 mg | Freq: Once | INTRAVENOUS | Status: AC
  Administered 2019-08-25: 20 mg via INTRAVENOUS
  Filled 2019-08-25: qty 20

## 2019-08-25 MED ORDER — SODIUM CHLORIDE 0.9 % IV SOLN
Freq: Once | INTRAVENOUS | Status: AC
  Filled 2019-08-25: qty 250

## 2019-08-25 MED ORDER — LEVOTHYROXINE SODIUM 88 MCG PO TABS: 88.0000 ug | ORAL_TABLET | Freq: Every day | ORAL | Status: DC

## 2019-08-25 MED ORDER — DIPHENHYDRAMINE HCL 50 MG/ML IJ SOLN
INTRAMUSCULAR | Status: AC
  Filled 2019-08-25: qty 1

## 2019-08-25 MED ORDER — HEPARIN SOD (PORK) LOCK FLUSH 100 UNIT/ML IV SOLN
500.0000 [IU] | Freq: Once | INTRAVENOUS | Status: AC | PRN
  Administered 2019-08-25: 500 [IU]
  Filled 2019-08-25: qty 5

## 2019-08-25 MED ORDER — FAMOTIDINE IN NACL 20-0.9 MG/50ML-% IV SOLN
20.0000 mg | Freq: Once | INTRAVENOUS | Status: AC
  Administered 2019-08-25: 20 mg via INTRAVENOUS

## 2019-08-25 MED ORDER — SODIUM CHLORIDE 0.9 % IV SOLN
65.0000 mg/m2 | Freq: Once | INTRAVENOUS | Status: AC
  Administered 2019-08-25: 138 mg via INTRAVENOUS
  Filled 2019-08-25: qty 23

## 2019-08-25 MED ORDER — SODIUM CHLORIDE 0.9% FLUSH
10.0000 mL | INTRAVENOUS | Status: DC | PRN
  Administered 2019-08-25: 10 mL
  Filled 2019-08-25: qty 10

## 2019-08-25 MED ORDER — DIPHENHYDRAMINE HCL 50 MG/ML IJ SOLN
25.0000 mg | Freq: Once | INTRAMUSCULAR | Status: AC
  Administered 2019-08-25: 25 mg via INTRAVENOUS

## 2019-08-25 MED ORDER — FAMOTIDINE IN NACL 20-0.9 MG/50ML-% IV SOLN
INTRAVENOUS | Status: AC
  Filled 2019-08-25: qty 50

## 2019-08-25 NOTE — Progress Notes (Signed)
Patient to receive filgrastim on Tuesdays prior to treatment per Dr. Lindi Adie. PA team notified of drug addition.   Demetrius Charity, PharmD, BCPS, [facility] Pharmacist Pharmacy Phone: [phone] 08/25/2019

## 2019-08-25 NOTE — Patient Instructions (Signed)
[facility] Discharge Instructions for Patients Receiving Chemotherapy  Today you received the following chemotherapy agents:  Taxol.  To help prevent nausea and vomiting after your treatment, we encourage you to take your nausea medication as directed.   If you develop nausea and vomiting that is not controlled by your nausea medication, call the clinic.   BELOW ARE SYMPTOMS THAT SHOULD BE REPORTED IMMEDIATELY:  *FEVER GREATER THAN 100.5 F  *CHILLS WITH OR WITHOUT FEVER  NAUSEA AND VOMITING THAT IS NOT CONTROLLED WITH YOUR NAUSEA MEDICATION  *UNUSUAL SHORTNESS OF BREATH  *UNUSUAL BRUISING OR BLEEDING  TENDERNESS IN MOUTH AND THROAT WITH OR WITHOUT PRESENCE OF ULCERS  *URINARY PROBLEMS  *BOWEL PROBLEMS  UNUSUAL RASH Items with * indicate a potential emergency and should be followed up as soon as possible.  Feel free to call the clinic should you have any questions or concerns. The clinic phone number is [phone].  Please show the CHEMO ALERT CARD at check-in to the Emergency Department and triage nurse.   

## 2019-08-25 NOTE — Assessment & Plan Note (Addendum)
04/25/2019:Patient palpated a left breast and axillary mass x4month. [place] the left breast showed a 3.6cm mass at the 1 o'clock position with 6 smaller adjacent masses extending toward the nipple ranging in size from 1.1cm to 4.0cm. [place] the left axilla showed five bulky lymph nodes, the largest measuring 4.2cm, and second largest measuring 2.8cm. 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 T1c N2 stage IIIc Based on PET CT scan showing bilateral cervical nodes, it is stage IV(however her goal is to cure given the oligometastatic nature of her cancer.)  PET CT scan 23/04/6008 Hypermetabolic nodules in the left breast and left axilla and subpectoral adenopathy. Small hypermetabolic lymph nodes in the neck. Marrow activity from G-CSF, thyroid activity possibly from immunotherapy.  MRI of the liver and LN: decided to observe after much   Treatment plan: 1. Neoadjuvant chemotherapy with Adriamycin and Cytoxanevery 3 weeks4 followed byTaxolweekly 12with carboplatin every 3 weeks and based on keynote 522 clinical trial we will add pembrolizumab every 3 weeks(8 cycles every 3 weeks) After she finishes neoadjuvant chemoappearing sternal lesions.  pembrolizumab maintenance every 3 weeks for 9 more cycles 2. Followed bymastectomy andaxillary lymph nodedissection 3. Followed by adjuvant radiation therapy ----------------------------------------------------------------------------------------------------------------------------------------------------- Current treatment: Completed 4 cycles of Adriamycin and Cytoxan with pembrolizumab, today is cycle 3 Taxol (with carboplatin and pembrolizumab every 3 weeks).   Chemo toxicities: 1.Nausea: Mild 2.Oral thrush: Resolved 3.  Immune mediated adverse effect: Hypothyroidism: Currently on 112 mcg of Synthroid.     Breast ultrasound 08/02/2019: Overall there is a decrease in size of the bulk of the  masses.  The lymph nodes have returned to normal size.  Ultrasound revealed a 5 cm x 1.1 x 3 cm previously it was 3.6 x 2.4 x 2 cm.  (Radiologist felt that the apparent increase in the size can be attributed to treatment changes of the appearance of the tissue) We will discuss her case in tumor board.  I do believe that she is responding to treatment.  Return to clinic weekly for chemo and every other week for follow-up with me.

## 2019-08-26 ENCOUNTER — Encounter: Payer: Self-pay | Admitting: *Deleted

## 2019-08-26 LAB — THYROID PANEL WITH TSH
Free Thyroxine Index: 0.3 — ABNORMAL LOW (ref 1.2–4.9)
T3 Uptake Ratio: 9 % — ABNORMAL LOW (ref 24–39)
T4, Total: 2.8 ug/dL — ABNORMAL LOW (ref 4.5–12.0)
TSH: 70.6 u[IU]/mL — ABNORMAL HIGH (ref 0.450–4.500)

## 2019-08-29 ENCOUNTER — Telehealth: Payer: Self-pay | Admitting: Hematology and Oncology

## 2019-08-29 NOTE — Telephone Encounter (Signed)
Scheduled per 5/17 sch message. Noted to print calendar out for pt.

## 2019-08-30 ENCOUNTER — Other Ambulatory Visit: Payer: Self-pay

## 2019-08-30 ENCOUNTER — Inpatient Hospital Stay: Payer: 59

## 2019-08-30 VITALS — BP 132/72 | HR 88 | Temp 98.1°F | Resp 18

## 2019-08-30 DIAGNOSIS — Z95828 Presence of other vascular implants and grafts: Secondary | ICD-10-CM

## 2019-08-30 DIAGNOSIS — Z5112 Encounter for antineoplastic immunotherapy: Secondary | ICD-10-CM | POA: Diagnosis not present

## 2019-08-30 MED ORDER — FILGRASTIM-SNDZ 480 MCG/0.8ML IJ SOSY
PREFILLED_SYRINGE | INTRAMUSCULAR | Status: AC
  Filled 2019-08-30: qty 0.8

## 2019-08-30 MED ORDER — FILGRASTIM-SNDZ 480 MCG/0.8ML IJ SOSY
480.0000 ug | PREFILLED_SYRINGE | Freq: Once | INTRAMUSCULAR | Status: AC
  Administered 2019-08-30: 480 ug via SUBCUTANEOUS

## 2019-08-30 NOTE — Patient Instructions (Signed)

## 2019-09-01 ENCOUNTER — Inpatient Hospital Stay: Payer: 59

## 2019-09-01 ENCOUNTER — Other Ambulatory Visit: Payer: Self-pay

## 2019-09-01 VITALS — BP 124/82 | HR 81 | Temp 98.7°F | Resp 18

## 2019-09-01 DIAGNOSIS — Z95828 Presence of other vascular implants and grafts: Secondary | ICD-10-CM

## 2019-09-01 DIAGNOSIS — Z5112 Encounter for antineoplastic immunotherapy: Secondary | ICD-10-CM | POA: Diagnosis not present

## 2019-09-01 DIAGNOSIS — C50812 Malignant neoplasm of overlapping sites of left female breast: Secondary | ICD-10-CM

## 2019-09-01 DIAGNOSIS — Z171 Estrogen receptor negative status [ER-]: Secondary | ICD-10-CM

## 2019-09-01 LAB — CBC WITH DIFFERENTIAL (CANCER CENTER ONLY)
Abs Immature Granulocytes: 0.11 10*3/uL — ABNORMAL HIGH (ref 0.00–0.07)
Basophils Absolute: 0.1 10*3/uL (ref 0.0–0.1)
Basophils Relative: 1 %
Eosinophils Absolute: 0.1 10*3/uL (ref 0.0–0.5)
Eosinophils Relative: 1 %
HCT: 30.4 % — ABNORMAL LOW (ref 36.0–46.0)
Hemoglobin: 9.8 g/dL — ABNORMAL LOW (ref 12.0–15.0)
Immature Granulocytes: 1 %
Lymphocytes Relative: 16 %
Lymphs Abs: 2.5 10*3/uL (ref 0.7–4.0)
MCH: 30.5 pg (ref 26.0–34.0)
MCHC: 32.2 g/dL (ref 30.0–36.0)
MCV: 94.7 fL (ref 80.0–100.0)
Monocytes Absolute: 0.5 10*3/uL (ref 0.1–1.0)
Monocytes Relative: 3 %
Neutro Abs: 12 10*3/uL — ABNORMAL HIGH (ref 1.7–7.7)
Neutrophils Relative %: 78 %
Platelet Count: 330 10*3/uL (ref 150–400)
RBC: 3.21 MIL/uL — ABNORMAL LOW (ref 3.87–5.11)
RDW: 16.7 % — ABNORMAL HIGH (ref 11.5–15.5)
WBC Count: 15.2 10*3/uL — ABNORMAL HIGH (ref 4.0–10.5)
nRBC: 0 % (ref 0.0–0.2)

## 2019-09-01 LAB — CMP (CANCER CENTER ONLY)
ALT: 28 U/L (ref 0–44)
AST: 26 U/L (ref 15–41)
Albumin: 3.8 g/dL (ref 3.5–5.0)
Alkaline Phosphatase: 105 U/L (ref 38–126)
Anion gap: 10 (ref 5–15)
BUN: 16 mg/dL (ref 6–20)
CO2: 25 mmol/L (ref 22–32)
Calcium: 9.6 mg/dL (ref 8.9–10.3)
Chloride: 103 mmol/L (ref 98–111)
Creatinine: 0.83 mg/dL (ref 0.44–1.00)
GFR, Est AFR Am: >60 mL/min (ref >60)
GFR, Estimated: >60 mL/min (ref >60)
Glucose, Bld: 90 mg/dL (ref 70–99)
Potassium: 4.1 mmol/L (ref 3.5–5.1)
Sodium: 138 mmol/L (ref 135–145)
Total Bilirubin: 0.3 mg/dL (ref 0.3–1.2)
Total Protein: 7.3 g/dL (ref 6.5–8.1)

## 2019-09-01 MED ORDER — SODIUM CHLORIDE 0.9 % IV SOLN
65.0000 mg/m2 | Freq: Once | INTRAVENOUS | Status: AC
  Administered 2019-09-01: 138 mg via INTRAVENOUS
  Filled 2019-09-01: qty 23

## 2019-09-01 MED ORDER — DIPHENHYDRAMINE HCL 50 MG/ML IJ SOLN
INTRAMUSCULAR | Status: AC
  Filled 2019-09-01: qty 1

## 2019-09-01 MED ORDER — SODIUM CHLORIDE 0.9% FLUSH
10.0000 mL | INTRAVENOUS | Status: DC | PRN
  Administered 2019-09-01: 10 mL
  Filled 2019-09-01: qty 10

## 2019-09-01 MED ORDER — FAMOTIDINE IN NACL 20-0.9 MG/50ML-% IV SOLN
INTRAVENOUS | Status: AC
  Filled 2019-09-01: qty 50

## 2019-09-01 MED ORDER — PALONOSETRON HCL INJECTION 0.25 MG/5ML
0.2500 mg | Freq: Once | INTRAVENOUS | Status: AC
  Administered 2019-09-01: 0.25 mg via INTRAVENOUS

## 2019-09-01 MED ORDER — SODIUM CHLORIDE 0.9 % IV SOLN
10.0000 mg | Freq: Once | INTRAVENOUS | Status: AC
  Administered 2019-09-01: 10 mg via INTRAVENOUS
  Filled 2019-09-01: qty 10
  Filled 2019-09-01: qty 1

## 2019-09-01 MED ORDER — DIPHENHYDRAMINE HCL 50 MG/ML IJ SOLN
25.0000 mg | Freq: Once | INTRAMUSCULAR | Status: AC
  Administered 2019-09-01: 25 mg via INTRAVENOUS

## 2019-09-01 MED ORDER — FAMOTIDINE IN NACL 20-0.9 MG/50ML-% IV SOLN
20.0000 mg | Freq: Once | INTRAVENOUS | Status: AC
  Administered 2019-09-01: 20 mg via INTRAVENOUS

## 2019-09-01 MED ORDER — HEPARIN SOD (PORK) LOCK FLUSH 100 UNIT/ML IV SOLN
500.0000 [IU] | Freq: Once | INTRAVENOUS | Status: AC | PRN
  Administered 2019-09-01: 500 [IU]
  Filled 2019-09-01: qty 5

## 2019-09-01 MED ORDER — SODIUM CHLORIDE 0.9 % IV SOLN
Freq: Once | INTRAVENOUS | Status: AC
  Filled 2019-09-01: qty 250

## 2019-09-01 MED ORDER — SODIUM CHLORIDE 0.9 % IV SOLN
700.0000 mg | Freq: Once | INTRAVENOUS | Status: AC
  Administered 2019-09-01: 700 mg via INTRAVENOUS
  Filled 2019-09-01: qty 70

## 2019-09-01 MED ORDER — SODIUM CHLORIDE 0.9 % IV SOLN
2.0000 mg/kg | Freq: Once | INTRAVENOUS | Status: AC
  Administered 2019-09-01: 200 mg via INTRAVENOUS
  Filled 2019-09-01: qty 8

## 2019-09-01 MED ORDER — SODIUM CHLORIDE 0.9 % IV SOLN
150.0000 mg | Freq: Once | INTRAVENOUS | Status: AC
  Administered 2019-09-01: 150 mg via INTRAVENOUS
  Filled 2019-09-01: qty 5
  Filled 2019-09-01: qty 150

## 2019-09-01 NOTE — Patient Instructions (Signed)
[facility] Discharge Instructions for Patients Receiving Chemotherapy  Today you received the following chemotherapy agents: Keytruda, Carboplatin, & Taxol  To help prevent nausea and vomiting after your treatment, we encourage you to take your nausea medication as directed.   If you develop nausea and vomiting that is not controlled by your nausea medication, call the clinic.   BELOW ARE SYMPTOMS THAT SHOULD BE REPORTED IMMEDIATELY:  *FEVER GREATER THAN 100.5 F  *CHILLS WITH OR WITHOUT FEVER  NAUSEA AND VOMITING THAT IS NOT CONTROLLED WITH YOUR NAUSEA MEDICATION  *UNUSUAL SHORTNESS OF BREATH  *UNUSUAL BRUISING OR BLEEDING  TENDERNESS IN MOUTH AND THROAT WITH OR WITHOUT PRESENCE OF ULCERS  *URINARY PROBLEMS  *BOWEL PROBLEMS  UNUSUAL RASH Items with * indicate a potential emergency and should be followed up as soon as possible.  Feel free to call the clinic should you have any questions or concerns. The clinic phone number is [phone].  Please show the CHEMO ALERT CARD at check-in to the Emergency Department and triage nurse.   

## 2019-09-02 ENCOUNTER — Other Ambulatory Visit: Payer: Self-pay | Admitting: *Deleted

## 2019-09-02 DIAGNOSIS — E039 Hypothyroidism, unspecified: Secondary | ICD-10-CM

## 2019-09-02 LAB — THYROID PANEL WITH TSH
Free Thyroxine Index: 0.3 — ABNORMAL LOW (ref 1.2–4.9)
T3 Uptake Ratio: 11 % — ABNORMAL LOW (ref 24–39)
T4, Total: 3 ug/dL — ABNORMAL LOW (ref 4.5–12.0)
TSH: 66.2 u[IU]/mL — ABNORMAL HIGH (ref 0.450–4.500)

## 2019-09-06 ENCOUNTER — Other Ambulatory Visit: Payer: Self-pay

## 2019-09-06 ENCOUNTER — Inpatient Hospital Stay: Payer: 59

## 2019-09-06 VITALS — BP 128/72 | HR 78 | Temp 98.2°F | Resp 18

## 2019-09-06 DIAGNOSIS — Z5112 Encounter for antineoplastic immunotherapy: Secondary | ICD-10-CM | POA: Diagnosis not present

## 2019-09-06 DIAGNOSIS — Z95828 Presence of other vascular implants and grafts: Secondary | ICD-10-CM

## 2019-09-06 MED ORDER — FILGRASTIM-SNDZ 480 MCG/0.8ML IJ SOSY
PREFILLED_SYRINGE | INTRAMUSCULAR | Status: AC
  Filled 2019-09-06: qty 0.8

## 2019-09-06 MED ORDER — FILGRASTIM-SNDZ 480 MCG/0.8ML IJ SOSY
480.0000 ug | PREFILLED_SYRINGE | Freq: Once | INTRAMUSCULAR | Status: AC
  Administered 2019-09-06: 480 ug via SUBCUTANEOUS

## 2019-09-06 NOTE — Patient Instructions (Signed)

## 2019-09-07 NOTE — Progress Notes (Signed)
Heather Mckenzie Care Team: Heather Mckenzie, No Pcp Per as PCP - General (General Practice) [name], RN as Oncology Nurse Navigator [name], RN as Oncology Nurse Navigator [name], MD as Consulting Physician (General Surgery) [name], MD as Consulting Physician (Hematology and Oncology) [name], MD as Consulting Physician (Radiation Oncology)  DIAGNOSIS:    ICD-10-CM   1. Malignant neoplasm of overlapping sites of left breast in female, estrogen receptor negative ([place])  C50.812    Z17.1     SUMMARY OF ONCOLOGIC HISTORY: Oncology History  Malignant neoplasm of overlapping sites of left breast in female, estrogen receptor negative ([place])  04/25/2019 Initial Diagnosis   Heather Mckenzie palpated a left breast and axillary mass x36month. [place] left breast showed a 3.6cm mass at the 1 o'clock position with 6 smaller adjacent masses extending toward the nipple ranging in size from 1.1cm to 4.0cm. [place] the left axilla showed five bulky lymph nodes, the largest measuring 4.2cm, and second largest measuring 2.8cm. 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    04/27/2019 Cancer Staging   Staging form: Breast, AJCC 8th Edition - Clinical: Stage IIIC (cT1c, cN2a, cM0, G3, ER-, PR-, HER2-) - Signed by GNicholas Lose MD on 04/27/2019   05/12/2019 -  Chemotherapy   The Heather Mckenzie had DOXOrubicin (ADRIAMYCIN) chemo injection 128 mg, 60 mg/m2 = 128 mg, Intravenous,  Once, 4 of 4 cycles Administration: 128 mg (05/12/2019), 128 mg (06/03/2019), 128 mg (06/23/2019), 128 mg (07/14/2019) palonosetron (ALOXI) injection 0.25 mg, 0.25 mg, Intravenous,  Once, 6 of 8 cycles Administration: 0.25 mg (05/12/2019), 0.25 mg (08/04/2019), 0.25 mg (06/03/2019), 0.25 mg (09/01/2019), 0.25 mg (06/23/2019), 0.25 mg (07/14/2019) pegfilgrastim-cbqv (UDENYCA) injection 6 mg, 6 mg, Subcutaneous, Once, 4 of 4 cycles Administration: 6 mg (05/14/2019), 6 mg (06/06/2019), 6 mg (06/25/2019),  6 mg (07/16/2019) CARBOplatin (PARAPLATIN) 700 mg in sodium chloride 0.9 % 250 mL chemo infusion, 700 mg (100 % of original dose 700 mg), Intravenous,  Once, 2 of 4 cycles Dose modification: 700 mg (original dose 700 mg, Cycle 5) Administration: 700 mg (08/04/2019), 700 mg (09/01/2019) cyclophosphamide (CYTOXAN) 1,280 mg in sodium chloride 0.9 % 250 mL chemo infusion, 600 mg/m2 = 1,280 mg, Intravenous,  Once, 4 of 4 cycles Administration: 1,280 mg (05/12/2019), 1,280 mg (06/03/2019), 1,280 mg (06/23/2019), 1,280 mg (07/14/2019) PACLitaxel (TAXOL) 174 mg in sodium chloride 0.9 % 250 mL chemo infusion (</= 830mm2), 80 mg/m2 = 174 mg, Intravenous,  Once, 2 of 4 cycles Dose modification: 65 mg/m2 (original dose 80 mg/m2, Cycle 6, Reason: Dose not tolerated) Administration: 174 mg (08/04/2019), 174 mg (08/11/2019), 138 mg (09/01/2019), 138 mg (08/25/2019) fosaprepitant (EMEND) 150 mg in sodium chloride 0.9 % 145 mL IVPB, 150 mg, Intravenous,  Once, 6 of 8 cycles Administration: 150 mg (05/12/2019), 150 mg (08/04/2019), 150 mg (06/03/2019), 150 mg (09/01/2019), 150 mg (06/23/2019), 150 mg (07/14/2019) pembrolizumab (KEYTRUDA) 200 mg in sodium chloride 0.9 % 50 mL chemo infusion, 2 mg/kg = 200 mg, Intravenous, Once, 6 of 7 cycles Administration: 200 mg (05/12/2019), 200 mg (06/03/2019), 200 mg (06/23/2019), 200 mg (07/14/2019), 200 mg (08/04/2019), 200 mg (09/01/2019)  for chemotherapy treatment.     Genetic Testing   Negative genetic testing. No pathogenic variants identified. VUS in HOXB13 called c.473C>A, VUS in POLD1 called c.1610G>C, and VUS in RAD50 called c.1094G>A identified on the [facility] Common Hereditary Cancers Panel. The report date is 05/10/2019.  The Common Hereditary Cancers Panel  offered by [facility] includes sequencing and/or deletion duplication testing of the following 48 genes: APC, ATM, AXIN2, BARD1, BMPR1A, BRCA1, BRCA2, BRIP1, CDH1, CDKN2A (p14ARF), CDKN2A (p16INK4a), CKD4, CHEK2, CTNNA1, DICER1, EPCAM  (Deletion/duplication testing only), GREM1 (promoter region deletion/duplication testing only), KIT, MEN1, MLH1, MSH2, MSH3, MSH6, MUTYH, NBN, NF1, NHTL1, PALB2, PDGFRA, PMS2, POLD1, POLE, PTEN, RAD50, RAD51C, RAD51D, RNF43, SDHB, SDHC, SDHD, SMAD4, SMARCA4. STK11, TP53, TSC1, TSC2, and VHL.  The following genes were evaluated for sequence changes only: SDHA and HOXB13 c.251G>A variant only.     CHIEF COMPLIANT: Cycle 5 Taxol   INTERVAL HISTORY: [name] is a 39 y.o. 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 She is currently on neoadjuvant chemotherapy withweekly Taxol and Carboplatin every 3 weeks.She presents to the clinic todayfor cycle5.  She denies any neuropathy symptoms.  Denies any nausea or vomiting.  She does have mild to moderate fatigue.  She is alternating 50 mcg and 88 mcg of Synthroid.  ALLERGIES:  has No Known Allergies.  MEDICATIONS:  Current Outpatient Medications  Medication Sig Dispense Refill  . fluconazole (DIFLUCAN) 100 MG tablet Take 1 tablet (100 mg total) by mouth daily. 7 tablet 0  . ibuprofen (ADVIL,MOTRIN) 600 MG tablet Take 1 tablet (600 mg total) by mouth every 6 (six) hours as needed for pain. 30 tablet 2  . levonorgestrel-ethinyl estradiol (AMETHYST) 90-20 MCG tablet Take 1 tablet by mouth daily. (28) 90 mcg-20 mcg tablet    . levothyroxine (SYNTHROID) 88 MCG tablet Take 1 tablet (88 mcg total) by mouth daily before breakfast.    . lidocaine-prilocaine (EMLA) cream Apply to affected area once 30 g 3  . LORazepam (ATIVAN) 0.5 MG tablet Take 1 tablet (0.5 mg total) by mouth at bedtime as needed for sleep. 30 tablet 0  . ondansetron (ZOFRAN) 8 MG tablet Take 1 tablet (8 mg total) by mouth 2 (two) times daily as needed. Start on the third day after chemotherapy. 30 tablet 1  . prochlorperazine (COMPAZINE) 10 MG tablet Take 1 tablet (10 mg total) by mouth every 6 (six) hours as needed (Nausea or vomiting). 30 tablet 1   No current  facility-administered medications for this visit.   Facility-Administered Medications Ordered in Other Visits  Medication Dose Route Frequency Provider Last Rate Last Admin  . famotidine (PEPCID) IVPB 20 mg premix  20 mg Intravenous Once [name], MD 100 mL/hr at 09/08/19 1429 20 mg at 09/08/19 1429  . heparin lock flush 100 unit/mL  500 Units Intracatheter Once PRN [name], MD      . PACLitaxel (TAXOL) 138 mg in sodium chloride 0.9 % 250 mL chemo infusion (</= 82m/m2)  65 mg/m2 (Treatment Plan Recorded) Intravenous Once GNicholas Lose MD        PHYSICAL EXAMINATION: ECOG PERFORMANCE STATUS: 1 - Symptomatic but completely ambulatory  Vitals:   09/08/19 1153  BP: 119/79  Pulse: 82  Resp: 17  Temp: 98.2 F (36.8 C)  SpO2: 100%   Filed Weights   09/08/19 1153  Weight: 220 lb 12.8 oz (100.2 kg)    LABORATORY DATA:  I have reviewed the data as listed CMP Latest Ref Rng & Units 09/08/2019 09/01/2019 08/25/2019  Glucose 70 - 99 mg/dL 89 90 93  BUN 6 - 20 mg/dL '18 16 10  ' Creatinine 0.44 - 1.00 mg/dL 0.84 0.83 0.83  Sodium 135 - 145 mmol/L 137 138 139  Potassium 3.5 - 5.1 mmol/L 3.7 4.1 4.2  Chloride 98 - 111 mmol/L 102  103 105  CO2 22 - 32 mmol/L '24 25 24  ' Calcium 8.9 - 10.3 mg/dL 9.2 9.6 9.1  Total Protein 6.5 - 8.1 g/dL 7.6 7.3 7.1  Total Bilirubin 0.3 - 1.2 mg/dL 0.5 0.3 0.3  Alkaline Phos 38 - 126 U/L 80 105 99  AST 15 - 41 U/L 25 26 34  ALT 0 - 44 U/L 22 28 33    Lab Results  Component Value Date   WBC 11.4 (H) 09/08/2019   HGB 9.9 (L) 09/08/2019   HCT 30.4 (L) 09/08/2019   MCV 95.3 09/08/2019   PLT 343 09/08/2019   NEUTROABS 9.0 (H) 09/08/2019    ASSESSMENT & PLAN:  Malignant neoplasm of overlapping sites of left breast in female, estrogen receptor negative ([place]) 04/25/2019:Heather Mckenzie palpated a left breast and axillary mass x52month. [place] left breast showed a 3.6cm mass at the 1 o'clock position with 6 smaller adjacent masses extending toward the  nipple ranging in size from 1.1cm to 4.0cm. [place] the left axilla showed five bulky lymph nodes, the largest measuring 4.2cm, and second largest measuring 2.8cm. 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 T1c N2 stage IIIc Based on PET CT scan showing bilateral cervical nodes, it is stage IV(however her goal is to cure given the oligometastatic nature of her cancer.)  PET CT scan 23/33/5456 Hypermetabolic nodules in the left breast and left axilla and subpectoral adenopathy. Small hypermetabolic lymph nodes in the neck. Marrow activity from G-CSF, thyroid activity possibly from immunotherapy.  MRI of the liver and LN: decided to observe after much   Treatment plan: 1. Neoadjuvant chemotherapy with Adriamycin and Cytoxanevery 3 weeks4 followed byTaxolweekly 12with carboplatin every 3 weeks and based on keynote 522 clinical trial we will add pembrolizumab every 3 weeks(8 cycles every 3 weeks) After she finishes neoadjuvant chemoappearing sternal lesions.  pembrolizumab maintenance every 3 weeks for 9 more cycles 2. Followed bymastectomy andaxillary lymph nodedissection 3. Followed by adjuvant radiation therapy ----------------------------------------------------------------------------------------------------------------------------------------------------- Current treatment:Completed 4 cycles ofAdriamycin and Cytoxan with pembrolizumab,today is cycle 5 Taxol (with carboplatin and pembrolizumab every 3 weeks).   Chemo toxicities: 1.Nausea: Mild 2.Oral thrush: Resolved 3.Immune mediated adverse effect: Hypothyroidism:  Requested endocrinology consultation.  When we increase the dosage of Synthroid Heather Mckenzie feels tachycardia and palpitations.   4.  Neutropenia: We reduced the dosage of Taxol to 65 mg per metered squared with cycle 3.  I would like to give her a dose of Granix on Tuesdays prior to each treatment.  We will  discontinue Benadryl. Return to clinic weekly for chemo and every other week for follow-up with me.    No orders of the defined types were placed in this encounter.  The Heather Mckenzie has a good understanding of the overall plan. she agrees with it. she will call with any problems that may develop before the next visit here.  Total time spent: 30 mins including face to face time and time spent for planning, charting and coordination of care  GNicholas Lose MD 09/08/2019  I, MCloyde ReamsDorshimer, am acting as scribe for Dr. VNicholas Lose  I have reviewed the above documentation for accuracy and completeness, and I agree with the above.

## 2019-09-08 ENCOUNTER — Inpatient Hospital Stay: Payer: 59

## 2019-09-08 ENCOUNTER — Telehealth: Payer: Self-pay | Admitting: *Deleted

## 2019-09-08 ENCOUNTER — Inpatient Hospital Stay (HOSPITAL_BASED_OUTPATIENT_CLINIC_OR_DEPARTMENT_OTHER): Payer: 59 | Admitting: Hematology and Oncology

## 2019-09-08 ENCOUNTER — Other Ambulatory Visit: Payer: Self-pay

## 2019-09-08 ENCOUNTER — Encounter: Payer: Self-pay | Admitting: *Deleted

## 2019-09-08 DIAGNOSIS — C50812 Malignant neoplasm of overlapping sites of left female breast: Secondary | ICD-10-CM

## 2019-09-08 DIAGNOSIS — Z171 Estrogen receptor negative status [ER-]: Secondary | ICD-10-CM

## 2019-09-08 DIAGNOSIS — Z95828 Presence of other vascular implants and grafts: Secondary | ICD-10-CM

## 2019-09-08 DIAGNOSIS — Z5112 Encounter for antineoplastic immunotherapy: Secondary | ICD-10-CM | POA: Diagnosis not present

## 2019-09-08 LAB — CMP (CANCER CENTER ONLY)
ALT: 22 U/L (ref 0–44)
AST: 25 U/L (ref 15–41)
Albumin: 4.3 g/dL (ref 3.5–5.0)
Alkaline Phosphatase: 80 U/L (ref 38–126)
Anion gap: 11 (ref 5–15)
BUN: 18 mg/dL (ref 6–20)
CO2: 24 mmol/L (ref 22–32)
Calcium: 9.2 mg/dL (ref 8.9–10.3)
Chloride: 102 mmol/L (ref 98–111)
Creatinine: 0.84 mg/dL (ref 0.44–1.00)
GFR, Est AFR Am: >60 mL/min (ref >60)
GFR, Estimated: >60 mL/min (ref >60)
Glucose, Bld: 89 mg/dL (ref 70–99)
Potassium: 3.7 mmol/L (ref 3.5–5.1)
Sodium: 137 mmol/L (ref 135–145)
Total Bilirubin: 0.5 mg/dL (ref 0.3–1.2)
Total Protein: 7.6 g/dL (ref 6.5–8.1)

## 2019-09-08 LAB — CBC WITH DIFFERENTIAL (CANCER CENTER ONLY)
Abs Immature Granulocytes: 0.27 10*3/uL — ABNORMAL HIGH (ref 0.00–0.07)
Basophils Absolute: 0.1 10*3/uL (ref 0.0–0.1)
Basophils Relative: 1 %
Eosinophils Absolute: 0.1 10*3/uL (ref 0.0–0.5)
Eosinophils Relative: 1 %
HCT: 30.4 % — ABNORMAL LOW (ref 36.0–46.0)
Hemoglobin: 9.9 g/dL — ABNORMAL LOW (ref 12.0–15.0)
Immature Granulocytes: 2 %
Lymphocytes Relative: 14 %
Lymphs Abs: 1.6 10*3/uL (ref 0.7–4.0)
MCH: 31 pg (ref 26.0–34.0)
MCHC: 32.6 g/dL (ref 30.0–36.0)
MCV: 95.3 fL (ref 80.0–100.0)
Monocytes Absolute: 0.4 10*3/uL (ref 0.1–1.0)
Monocytes Relative: 4 %
Neutro Abs: 9 10*3/uL — ABNORMAL HIGH (ref 1.7–7.7)
Neutrophils Relative %: 78 %
Platelet Count: 343 10*3/uL (ref 150–400)
RBC: 3.19 MIL/uL — ABNORMAL LOW (ref 3.87–5.11)
RDW: 15.9 % — ABNORMAL HIGH (ref 11.5–15.5)
WBC Count: 11.4 10*3/uL — ABNORMAL HIGH (ref 4.0–10.5)
nRBC: 0 % (ref 0.0–0.2)

## 2019-09-08 MED ORDER — HEPARIN SOD (PORK) LOCK FLUSH 100 UNIT/ML IV SOLN
500.0000 [IU] | Freq: Once | INTRAVENOUS | Status: AC | PRN
  Administered 2019-09-08: 500 [IU]
  Filled 2019-09-08: qty 5

## 2019-09-08 MED ORDER — FAMOTIDINE IN NACL 20-0.9 MG/50ML-% IV SOLN
INTRAVENOUS | Status: AC
  Filled 2019-09-08: qty 50

## 2019-09-08 MED ORDER — SODIUM CHLORIDE 0.9% FLUSH
10.0000 mL | INTRAVENOUS | Status: DC | PRN
  Administered 2019-09-08: 10 mL
  Filled 2019-09-08: qty 10

## 2019-09-08 MED ORDER — SODIUM CHLORIDE 0.9 % IV SOLN
20.0000 mg | Freq: Once | INTRAVENOUS | Status: AC
  Administered 2019-09-08: 20 mg via INTRAVENOUS
  Filled 2019-09-08: qty 20

## 2019-09-08 MED ORDER — FAMOTIDINE IN NACL 20-0.9 MG/50ML-% IV SOLN
20.0000 mg | Freq: Once | INTRAVENOUS | Status: AC
  Administered 2019-09-08: 20 mg via INTRAVENOUS

## 2019-09-08 MED ORDER — SODIUM CHLORIDE 0.9 % IV SOLN
Freq: Once | INTRAVENOUS | Status: AC
  Filled 2019-09-08: qty 250

## 2019-09-08 MED ORDER — SODIUM CHLORIDE 0.9 % IV SOLN
65.0000 mg/m2 | Freq: Once | INTRAVENOUS | Status: AC
  Administered 2019-09-08: 138 mg via INTRAVENOUS
  Filled 2019-09-08: qty 23

## 2019-09-08 MED ORDER — SODIUM CHLORIDE 0.9% FLUSH
10.0000 mL | INTRAVENOUS | Status: DC | PRN
  Filled 2019-09-08: qty 10

## 2019-09-08 NOTE — Assessment & Plan Note (Signed)
04/25/2019:Patient palpated a left breast and axillary mass x61month. [place] the left breast showed a 3.6cm mass at the 1 o'clock position with 6 smaller adjacent masses extending toward the nipple ranging in size from 1.1cm to 4.0cm. [place] the left axilla showed five bulky lymph nodes, the largest measuring 4.2cm, and second largest measuring 2.8cm. 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 T1c N2 stage IIIc Based on PET CT scan showing bilateral cervical nodes, it is stage IV(however her goal is to cure given the oligometastatic nature of her cancer.)  PET CT scan 25/79/7282 Hypermetabolic nodules in the left breast and left axilla and subpectoral adenopathy. Small hypermetabolic lymph nodes in the neck. Marrow activity from G-CSF, thyroid activity possibly from immunotherapy.  MRI of the liver and LN: decided to observe after much   Treatment plan: 1. Neoadjuvant chemotherapy with Adriamycin and Cytoxanevery 3 weeks4 followed byTaxolweekly 12with carboplatin every 3 weeks and based on keynote 522 clinical trial we will add pembrolizumab every 3 weeks(8 cycles every 3 weeks) After she finishes neoadjuvant chemoappearing sternal lesions.  pembrolizumab maintenance every 3 weeks for 9 more cycles 2. Followed bymastectomy andaxillary lymph nodedissection 3. Followed by adjuvant radiation therapy ----------------------------------------------------------------------------------------------------------------------------------------------------- Current treatment:Completed 4 cycles ofAdriamycin and Cytoxan with pembrolizumab,today is cycle 5 Taxol (with carboplatin and pembrolizumab every 3 weeks).   Chemo toxicities: 1.Nausea: Mild 2.Oral thrush: Resolved 3.Immune mediated adverse effect: Hypothyroidism:  Requested endocrinology consultation.  When we increase the dosage of Synthroid patient feels tachycardia and palpitations.    4.  Neutropenia: We reduced the dosage of Taxol to 65 mg per metered squared with cycle 3.  I would like to give her a dose of Granix on Tuesdays prior to each treatment.  Return to clinic weekly for chemo and every other week for follow-up with me.

## 2019-09-08 NOTE — Patient Instructions (Signed)
[facility] Discharge Instructions for Patients Receiving Chemotherapy  Today you received the following chemotherapy agents :  Taxol.  To help prevent nausea and vomiting after your treatment, we encourage you to take your nausea medication as prescribed.   If you develop nausea and vomiting that is not controlled by your nausea medication, call the clinic.   BELOW ARE SYMPTOMS THAT SHOULD BE REPORTED IMMEDIATELY:  *FEVER GREATER THAN 100.5 F  *CHILLS WITH OR WITHOUT FEVER  NAUSEA AND VOMITING THAT IS NOT CONTROLLED WITH YOUR NAUSEA MEDICATION  *UNUSUAL SHORTNESS OF BREATH  *UNUSUAL BRUISING OR BLEEDING  TENDERNESS IN MOUTH AND THROAT WITH OR WITHOUT PRESENCE OF ULCERS  *URINARY PROBLEMS  *BOWEL PROBLEMS  UNUSUAL RASH Items with * indicate a potential emergency and should be followed up as soon as possible.  Feel free to call the clinic should you have any questions or concerns. The clinic phone number is [phone].  Please show the CHEMO ALERT CARD at check-in to the Emergency Department and triage nurse.   

## 2019-09-08 NOTE — Telephone Encounter (Signed)
Called pt to make aware of appt with endocrinologist Dr.Avernini on 09/13/19 @ 9am. Pt verbalized understanding

## 2019-09-08 NOTE — Progress Notes (Signed)
Per Marlon Pel RN, Dr. Jana Hakim advised to hold pre-tx diphenhydramine today.

## 2019-09-09 LAB — THYROID PANEL WITH TSH
Free Thyroxine Index: 0.3 — ABNORMAL LOW (ref 1.2–4.9)
T3 Uptake Ratio: 9 % — ABNORMAL LOW (ref 24–39)
T4, Total: 3.1 ug/dL — ABNORMAL LOW (ref 4.5–12.0)
TSH: 62.4 u[IU]/mL — ABNORMAL HIGH (ref 0.450–4.500)

## 2019-09-13 ENCOUNTER — Inpatient Hospital Stay: Payer: 59 | Attending: Hematology and Oncology

## 2019-09-13 ENCOUNTER — Other Ambulatory Visit: Payer: Self-pay

## 2019-09-13 VITALS — BP 122/72 | HR 78 | Temp 98.2°F | Resp 18

## 2019-09-13 DIAGNOSIS — Z5111 Encounter for antineoplastic chemotherapy: Secondary | ICD-10-CM | POA: Insufficient documentation

## 2019-09-13 DIAGNOSIS — D701 Agranulocytosis secondary to cancer chemotherapy: Secondary | ICD-10-CM | POA: Diagnosis not present

## 2019-09-13 DIAGNOSIS — Z171 Estrogen receptor negative status [ER-]: Secondary | ICD-10-CM | POA: Diagnosis not present

## 2019-09-13 DIAGNOSIS — C50812 Malignant neoplasm of overlapping sites of left female breast: Secondary | ICD-10-CM | POA: Insufficient documentation

## 2019-09-13 DIAGNOSIS — Z95828 Presence of other vascular implants and grafts: Secondary | ICD-10-CM

## 2019-09-13 DIAGNOSIS — Z5112 Encounter for antineoplastic immunotherapy: Secondary | ICD-10-CM | POA: Insufficient documentation

## 2019-09-13 MED ORDER — FILGRASTIM-SNDZ 480 MCG/0.8ML IJ SOSY
480.0000 ug | PREFILLED_SYRINGE | Freq: Once | INTRAMUSCULAR | Status: AC
  Administered 2019-09-13: 480 ug via SUBCUTANEOUS

## 2019-09-13 MED ORDER — FILGRASTIM-SNDZ 480 MCG/0.8ML IJ SOSY
PREFILLED_SYRINGE | INTRAMUSCULAR | Status: AC
  Filled 2019-09-13: qty 0.8

## 2019-09-13 NOTE — Patient Instructions (Signed)

## 2019-09-15 ENCOUNTER — Other Ambulatory Visit: Payer: Self-pay

## 2019-09-15 ENCOUNTER — Inpatient Hospital Stay: Payer: 59

## 2019-09-15 VITALS — BP 116/75 | HR 88 | Temp 98.7°F | Resp 18

## 2019-09-15 DIAGNOSIS — Z171 Estrogen receptor negative status [ER-]: Secondary | ICD-10-CM

## 2019-09-15 DIAGNOSIS — Z5112 Encounter for antineoplastic immunotherapy: Secondary | ICD-10-CM | POA: Diagnosis not present

## 2019-09-15 LAB — CMP (CANCER CENTER ONLY)
ALT: 19 U/L (ref 0–44)
AST: 22 U/L (ref 15–41)
Albumin: 3.9 g/dL (ref 3.5–5.0)
Alkaline Phosphatase: 93 U/L (ref 38–126)
Anion gap: 10 (ref 5–15)
BUN: 14 mg/dL (ref 6–20)
CO2: 22 mmol/L (ref 22–32)
Calcium: 9.3 mg/dL (ref 8.9–10.3)
Chloride: 107 mmol/L (ref 98–111)
Creatinine: 0.88 mg/dL (ref 0.44–1.00)
GFR, Est AFR Am: >60 mL/min (ref >60)
GFR, Estimated: >60 mL/min (ref >60)
Glucose, Bld: 94 mg/dL (ref 70–99)
Potassium: 4 mmol/L (ref 3.5–5.1)
Sodium: 139 mmol/L (ref 135–145)
Total Bilirubin: 0.2 mg/dL — ABNORMAL LOW (ref 0.3–1.2)
Total Protein: 7.1 g/dL (ref 6.5–8.1)

## 2019-09-15 LAB — CBC WITH DIFFERENTIAL (CANCER CENTER ONLY)
Abs Immature Granulocytes: 0.44 10*3/uL — ABNORMAL HIGH (ref 0.00–0.07)
Basophils Absolute: 0.1 10*3/uL (ref 0.0–0.1)
Basophils Relative: 1 %
Eosinophils Absolute: 0.1 10*3/uL (ref 0.0–0.5)
Eosinophils Relative: 1 %
HCT: 31.1 % — ABNORMAL LOW (ref 36.0–46.0)
Hemoglobin: 10.1 g/dL — ABNORMAL LOW (ref 12.0–15.0)
Immature Granulocytes: 3 %
Lymphocytes Relative: 14 %
Lymphs Abs: 2 10*3/uL (ref 0.7–4.0)
MCH: 32.2 pg (ref 26.0–34.0)
MCHC: 32.5 g/dL (ref 30.0–36.0)
MCV: 99 fL (ref 80.0–100.0)
Monocytes Absolute: 0.6 10*3/uL (ref 0.1–1.0)
Monocytes Relative: 4 %
Neutro Abs: 11.5 10*3/uL — ABNORMAL HIGH (ref 1.7–7.7)
Neutrophils Relative %: 77 %
Platelet Count: 265 10*3/uL (ref 150–400)
RBC: 3.14 MIL/uL — ABNORMAL LOW (ref 3.87–5.11)
RDW: 16.8 % — ABNORMAL HIGH (ref 11.5–15.5)
WBC Count: 14.9 10*3/uL — ABNORMAL HIGH (ref 4.0–10.5)
nRBC: 0 % (ref 0.0–0.2)

## 2019-09-15 MED ORDER — SODIUM CHLORIDE 0.9 % IV SOLN
65.0000 mg/m2 | Freq: Once | INTRAVENOUS | Status: AC
  Administered 2019-09-15: 138 mg via INTRAVENOUS
  Filled 2019-09-15: qty 23

## 2019-09-15 MED ORDER — HEPARIN SOD (PORK) LOCK FLUSH 100 UNIT/ML IV SOLN
500.0000 [IU] | Freq: Once | INTRAVENOUS | Status: AC | PRN
  Administered 2019-09-15: 500 [IU]
  Filled 2019-09-15: qty 5

## 2019-09-15 MED ORDER — SODIUM CHLORIDE 0.9% FLUSH
10.0000 mL | INTRAVENOUS | Status: DC | PRN
  Administered 2019-09-15: 10 mL
  Filled 2019-09-15: qty 10

## 2019-09-15 MED ORDER — FAMOTIDINE IN NACL 20-0.9 MG/50ML-% IV SOLN
INTRAVENOUS | Status: AC
  Filled 2019-09-15: qty 50

## 2019-09-15 MED ORDER — SODIUM CHLORIDE 0.9 % IV SOLN
Freq: Once | INTRAVENOUS | Status: AC
  Filled 2019-09-15: qty 250

## 2019-09-15 MED ORDER — SODIUM CHLORIDE 0.9 % IV SOLN
20.0000 mg | Freq: Once | INTRAVENOUS | Status: AC
  Administered 2019-09-15: 20 mg via INTRAVENOUS
  Filled 2019-09-15: qty 20

## 2019-09-15 MED ORDER — FAMOTIDINE IN NACL 20-0.9 MG/50ML-% IV SOLN
20.0000 mg | Freq: Once | INTRAVENOUS | Status: AC
  Administered 2019-09-15: 20 mg via INTRAVENOUS

## 2019-09-15 NOTE — Patient Instructions (Signed)
[facility] Discharge Instructions for Patients Receiving Chemotherapy  Today you received the following chemotherapy agents:  Taxol.  To help prevent nausea and vomiting after your treatment, we encourage you to take your nausea medication as directed.   If you develop nausea and vomiting that is not controlled by your nausea medication, call the clinic.   BELOW ARE SYMPTOMS THAT SHOULD BE REPORTED IMMEDIATELY:  *FEVER GREATER THAN 100.5 F  *CHILLS WITH OR WITHOUT FEVER  NAUSEA AND VOMITING THAT IS NOT CONTROLLED WITH YOUR NAUSEA MEDICATION  *UNUSUAL SHORTNESS OF BREATH  *UNUSUAL BRUISING OR BLEEDING  TENDERNESS IN MOUTH AND THROAT WITH OR WITHOUT PRESENCE OF ULCERS  *URINARY PROBLEMS  *BOWEL PROBLEMS  UNUSUAL RASH Items with * indicate a potential emergency and should be followed up as soon as possible.  Feel free to call the clinic should you have any questions or concerns. The clinic phone number is [phone].  Please show the CHEMO ALERT CARD at check-in to the Emergency Department and triage nurse.   

## 2019-09-16 LAB — THYROID PANEL WITH TSH
Free Thyroxine Index: 0.4 — ABNORMAL LOW (ref 1.2–4.9)
T3 Uptake Ratio: 11 % — ABNORMAL LOW (ref 24–39)
T4, Total: 3.8 ug/dL — ABNORMAL LOW (ref 4.5–12.0)
TSH: 69.6 u[IU]/mL — ABNORMAL HIGH (ref 0.450–4.500)

## 2019-09-20 ENCOUNTER — Inpatient Hospital Stay: Payer: 59

## 2019-09-20 ENCOUNTER — Other Ambulatory Visit: Payer: Self-pay

## 2019-09-20 VITALS — BP 112/85 | HR 85 | Temp 98.5°F | Resp 20

## 2019-09-20 DIAGNOSIS — Z5112 Encounter for antineoplastic immunotherapy: Secondary | ICD-10-CM | POA: Diagnosis not present

## 2019-09-20 DIAGNOSIS — Z95828 Presence of other vascular implants and grafts: Secondary | ICD-10-CM

## 2019-09-20 MED ORDER — FILGRASTIM-SNDZ 480 MCG/0.8ML IJ SOSY
480.0000 ug | PREFILLED_SYRINGE | Freq: Once | INTRAMUSCULAR | Status: AC
  Administered 2019-09-20: 480 ug via SUBCUTANEOUS

## 2019-09-20 MED ORDER — FILGRASTIM-SNDZ 480 MCG/0.8ML IJ SOSY
PREFILLED_SYRINGE | INTRAMUSCULAR | Status: AC
  Filled 2019-09-20: qty 0.8

## 2019-09-20 NOTE — Patient Instructions (Signed)

## 2019-09-21 NOTE — Progress Notes (Signed)
Patient Care Team: Patient, No Pcp Per as PCP - General (General Practice) [name], RN as Oncology Nurse Navigator [name], RN as Oncology Nurse Navigator [name], MD as Consulting Physician (General Surgery) [name], MD as Consulting Physician (Hematology and Oncology) [name], MD as Consulting Physician (Radiation Oncology)  DIAGNOSIS:    ICD-10-CM   1. Malignant neoplasm of overlapping sites of left breast in female, estrogen receptor negative ([place])  C50.812    Z17.1     SUMMARY OF ONCOLOGIC HISTORY: Oncology History  Malignant neoplasm of overlapping sites of left breast in female, estrogen receptor negative ([place])  04/25/2019 Initial Diagnosis   Patient palpated a left breast and axillary mass x78month. [place] left breast showed a 3.6cm mass at the 1 o'clock position with 6 smaller adjacent masses extending toward the nipple ranging in size from 1.1cm to 4.0cm. [place] the left axilla showed five bulky lymph nodes, the largest measuring 4.2cm, and second largest measuring 2.8cm. 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    04/27/2019 Cancer Staging   Staging form: Breast, AJCC 8th Edition - Clinical: Stage IIIC (cT1c, cN2a, cM0, G3, ER-, PR-, HER2-) - Signed by GNicholas Lose MD on 04/27/2019   05/12/2019 -  Chemotherapy   The patient had DOXOrubicin (ADRIAMYCIN) chemo injection 128 mg, 60 mg/m2 = 128 mg, Intravenous,  Once, 4 of 4 cycles Administration: 128 mg (05/12/2019), 128 mg (06/03/2019), 128 mg (06/23/2019), 128 mg (07/14/2019) palonosetron (ALOXI) injection 0.25 mg, 0.25 mg, Intravenous,  Once, 6 of 8 cycles Administration: 0.25 mg (05/12/2019), 0.25 mg (08/04/2019), 0.25 mg (06/03/2019), 0.25 mg (09/01/2019), 0.25 mg (06/23/2019), 0.25 mg (07/14/2019) pegfilgrastim-cbqv (UDENYCA) injection 6 mg, 6 mg, Subcutaneous, Once, 4 of 4 cycles Administration: 6 mg (05/14/2019), 6 mg (06/06/2019), 6 mg (06/25/2019),  6 mg (07/16/2019) CARBOplatin (PARAPLATIN) 700 mg in sodium chloride 0.9 % 250 mL chemo infusion, 700 mg (100 % of original dose 700 mg), Intravenous,  Once, 2 of 4 cycles Dose modification: 700 mg (original dose 700 mg, Cycle 5) Administration: 700 mg (08/04/2019), 700 mg (09/01/2019) cyclophosphamide (CYTOXAN) 1,280 mg in sodium chloride 0.9 % 250 mL chemo infusion, 600 mg/m2 = 1,280 mg, Intravenous,  Once, 4 of 4 cycles Administration: 1,280 mg (05/12/2019), 1,280 mg (06/03/2019), 1,280 mg (06/23/2019), 1,280 mg (07/14/2019) PACLitaxel (TAXOL) 174 mg in sodium chloride 0.9 % 250 mL chemo infusion (</= 872mm2), 80 mg/m2 = 174 mg, Intravenous,  Once, 2 of 4 cycles Dose modification: 65 mg/m2 (original dose 80 mg/m2, Cycle 6, Reason: Dose not tolerated) Administration: 174 mg (08/04/2019), 174 mg (08/11/2019), 138 mg (09/01/2019), 138 mg (08/25/2019), 138 mg (09/08/2019), 138 mg (09/15/2019) fosaprepitant (EMEND) 150 mg in sodium chloride 0.9 % 145 mL IVPB, 150 mg, Intravenous,  Once, 6 of 8 cycles Administration: 150 mg (05/12/2019), 150 mg (08/04/2019), 150 mg (06/03/2019), 150 mg (09/01/2019), 150 mg (06/23/2019), 150 mg (07/14/2019) pembrolizumab (KEYTRUDA) 200 mg in sodium chloride 0.9 % 50 mL chemo infusion, 2 mg/kg = 200 mg, Intravenous, Once, 6 of 7 cycles Administration: 200 mg (05/12/2019), 200 mg (06/03/2019), 200 mg (06/23/2019), 200 mg (07/14/2019), 200 mg (08/04/2019), 200 mg (09/01/2019)  for chemotherapy treatment.     Genetic Testing   Negative genetic testing. No pathogenic variants identified. VUS in HOXB13 called c.473C>A, VUS in POLD1 called c.1610G>C, and VUS in RAD50 called c.1094G>A identified on the [facility] Common Hereditary Cancers Panel. The report date is 05/10/2019.  The Common Hereditary Cancers Panel offered by [facility] includes sequencing and/or deletion duplication testing of the following 48 genes: APC, ATM, AXIN2, BARD1, BMPR1A, BRCA1, BRCA2, BRIP1, CDH1, CDKN2A (p14ARF), CDKN2A (p16INK4a),  CKD4, CHEK2, CTNNA1, DICER1, EPCAM (Deletion/duplication testing only), GREM1 (promoter region deletion/duplication testing only), KIT, MEN1, MLH1, MSH2, MSH3, MSH6, MUTYH, NBN, NF1, NHTL1, PALB2, PDGFRA, PMS2, POLD1, POLE, PTEN, RAD50, RAD51C, RAD51D, RNF43, SDHB, SDHC, SDHD, SMAD4, SMARCA4. STK11, TP53, TSC1, TSC2, and VHL.  The following genes were evaluated for sequence changes only: SDHA and HOXB13 c.251G>A variant only.     CHIEF COMPLIANT: Cycle7Taxol Carboplatin  INTERVAL HISTORY: Heather Mckenzie is a 39 y.o. 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 She is currently on neoadjuvant chemotherapy withweekly Taxol and Carboplatin every 3weeks.She presents to the clinic todayfor cycle7.    She is tolerating her treatment fairly well.  She has been getting growth factor injection a couple of days before each treatment and that has kept up her white blood cell count up.  She is seeing endocrinology who is adjusting her thyroid medication.  She feels relatively well.  Does not have any fix of neuropathy.  She does have occasional tingling and numbness that goes away.  Denies any nausea or vomiting.  Today she gets both pembrolizumab and carboplatin along with Taxol.  ALLERGIES:  has No Known Allergies.  MEDICATIONS:  Current Outpatient Medications  Medication Sig Dispense Refill  . fluconazole (DIFLUCAN) 100 MG tablet Take 1 tablet (100 mg total) by mouth daily. 7 tablet 0  . ibuprofen (ADVIL,MOTRIN) 600 MG tablet Take 1 tablet (600 mg total) by mouth every 6 (six) hours as needed for pain. 30 tablet 2  . levonorgestrel-ethinyl estradiol (AMETHYST) 90-20 MCG tablet Take 1 tablet by mouth daily. (28) 90 mcg-20 mcg tablet    . levothyroxine (SYNTHROID) 88 MCG tablet Take 1 tablet (88 mcg total) by mouth daily before breakfast.    . lidocaine-prilocaine (EMLA) cream Apply to affected area once 30 g 3  . LORazepam (ATIVAN) 0.5 MG tablet Take 1 tablet (0.5 mg total) by  mouth at bedtime as needed for sleep. 30 tablet 0  . ondansetron (ZOFRAN) 8 MG tablet Take 1 tablet (8 mg total) by mouth 2 (two) times daily as needed. Start on the third day after chemotherapy. 30 tablet 1  . prochlorperazine (COMPAZINE) 10 MG tablet Take 1 tablet (10 mg total) by mouth every 6 (six) hours as needed (Nausea or vomiting). 30 tablet 1   No current facility-administered medications for this visit.    PHYSICAL EXAMINATION: ECOG PERFORMANCE STATUS: 1 - Symptomatic but completely ambulatory  Vitals:   09/22/19 1000  BP: 112/83  Pulse: 87  Resp: 17  Temp: 98.2 F (36.8 C)  SpO2: 100%   Filed Weights   09/22/19 1000  Weight: 218 lb 3.2 oz (99 kg)    LABORATORY DATA:  I have reviewed the data as listed CMP Latest Ref Rng & Units 09/15/2019 09/08/2019 09/01/2019  Glucose 70 - 99 mg/dL 94 89 90  BUN 6 - 20 mg/dL '14 18 16  ' Creatinine 0.44 - 1.00 mg/dL 0.88 0.84 0.83  Sodium 135 - 145 mmol/L 139 137 138  Potassium 3.5 - 5.1 mmol/L 4.0 3.7 4.1  Chloride 98 - 111 mmol/L 107 102 103  CO2 22 - 32 mmol/L '22 24 25  ' Calcium 8.9 - 10.3 mg/dL 9.3 9.2 9.6  Total Protein 6.5 - 8.1 g/dL 7.1 7.6 7.3  Total Bilirubin 0.3 - 1.2 mg/dL  0.2(L) 0.5 0.3  Alkaline Phos 38 - 126 U/L 93 80 105  AST 15 - 41 U/L '22 25 26  ' ALT 0 - 44 U/L '19 22 28    ' Lab Results  Component Value Date   WBC 19.8 (H) 09/22/2019   HGB 10.0 (L) 09/22/2019   HCT 30.6 (L) 09/22/2019   MCV 98.7 09/22/2019   PLT 200 09/22/2019   NEUTROABS 16.5 (H) 09/22/2019    ASSESSMENT & PLAN:  Malignant neoplasm of overlapping sites of left breast in female, estrogen receptor negative ([place]) /02/2020:Patient palpated a left breast and axillary mass x52month. [place] left breast showed a 3.6cm mass at the 1 o'clock position with 6 smaller adjacent masses extending toward the nipple ranging in size from 1.1cm to 4.0cm. [place] the left axilla showed five bulky lymph nodes, the largest measuring 4.2cm, and second largest  measuring 2.8cm. 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 T1c N2 stage IIIc Based on PET CT scan showing bilateral cervical nodes, it is stage IV(however her goal is to cure given the oligometastatic nature of her cancer.)  PET CT scan 22/57/5051 Hypermetabolic nodules in the left breast and left axilla and subpectoral adenopathy. Small hypermetabolic lymph nodes in the neck. Marrow activity from G-CSF, thyroid activity possibly from immunotherapy.  MRI of the liver and LN: decided to observe after much   Treatment plan: 1. Neoadjuvant chemotherapy with Adriamycin and Cytoxanevery 3 weeks4 followed byTaxolweekly 12with carboplatin every 3 weeks and based on keynote 522 clinical trial we will add pembrolizumab every 3 weeks(8 cycles every 3 weeks) After she finishes neoadjuvant chemoappearing sternal lesions.  pembrolizumab maintenance every 3 weeks for 9 more cycles 2. Followed bymastectomy andaxillary lymph nodedissection 3. Followed by adjuvant radiation therapy ----------------------------------------------------------------------------------------------------------------------------------------------------- Current treatment:Completed 4 cycles ofAdriamycin and Cytoxan with pembrolizumab,today is cycle5Taxol (with carboplatin and pembrolizumabevery 3 weeks).   Chemo toxicities: 1.Nausea: Mild 2.Oral thrush: Resolved 3.Immune mediated adverse effect: Hypothyroidism:  Follows with endocrinology.    4.Neutropenia: We reduced the dosage of Taxol to 65 mg per metered squared with cycle 3.  We will giving her Granix injections a couple of days before chemo and her white blood cell count has gone up to 19.5 today.  I would like to hold off on Granix for the next couple of treatment and see how her blood counts act.  We can resume Granix if her white count declines significantly and she cannot receive treatment.     Return to clinic weekly for chemo and every other week for follow-up with me.    No orders of the defined types were placed in this encounter.  The patient has a good understanding of the overall plan. she agrees with it. she will call with any problems that may develop before the next visit here.  Total time spent: 30 mins including face to face time and time spent for planning, charting and coordination of care  GNicholas Lose MD 09/22/2019  I, MCloyde ReamsDorshimer, am acting as scribe for Dr. VNicholas Lose  I have reviewed the above documentation for accuracy and completeness, and I agree with the above.

## 2019-09-22 ENCOUNTER — Inpatient Hospital Stay: Payer: 59

## 2019-09-22 ENCOUNTER — Encounter: Payer: Self-pay | Admitting: *Deleted

## 2019-09-22 ENCOUNTER — Inpatient Hospital Stay (HOSPITAL_BASED_OUTPATIENT_CLINIC_OR_DEPARTMENT_OTHER): Payer: 59 | Admitting: Hematology and Oncology

## 2019-09-22 ENCOUNTER — Other Ambulatory Visit: Payer: Self-pay

## 2019-09-22 DIAGNOSIS — Z171 Estrogen receptor negative status [ER-]: Secondary | ICD-10-CM | POA: Diagnosis not present

## 2019-09-22 DIAGNOSIS — C50812 Malignant neoplasm of overlapping sites of left female breast: Secondary | ICD-10-CM

## 2019-09-22 DIAGNOSIS — Z95828 Presence of other vascular implants and grafts: Secondary | ICD-10-CM

## 2019-09-22 DIAGNOSIS — Z5112 Encounter for antineoplastic immunotherapy: Secondary | ICD-10-CM | POA: Diagnosis not present

## 2019-09-22 LAB — CBC WITH DIFFERENTIAL (CANCER CENTER ONLY)
Abs Immature Granulocytes: 0.55 10*3/uL — ABNORMAL HIGH (ref 0.00–0.07)
Basophils Absolute: 0.1 10*3/uL (ref 0.0–0.1)
Basophils Relative: 1 %
Eosinophils Absolute: 0.1 10*3/uL (ref 0.0–0.5)
Eosinophils Relative: 0 %
HCT: 30.6 % — ABNORMAL LOW (ref 36.0–46.0)
Hemoglobin: 10 g/dL — ABNORMAL LOW (ref 12.0–15.0)
Immature Granulocytes: 3 %
Lymphocytes Relative: 10 %
Lymphs Abs: 2 10*3/uL (ref 0.7–4.0)
MCH: 32.3 pg (ref 26.0–34.0)
MCHC: 32.7 g/dL (ref 30.0–36.0)
MCV: 98.7 fL (ref 80.0–100.0)
Monocytes Absolute: 0.6 10*3/uL (ref 0.1–1.0)
Monocytes Relative: 3 %
Neutro Abs: 16.5 10*3/uL — ABNORMAL HIGH (ref 1.7–7.7)
Neutrophils Relative %: 83 %
Platelet Count: 200 10*3/uL (ref 150–400)
RBC: 3.1 MIL/uL — ABNORMAL LOW (ref 3.87–5.11)
RDW: 16.5 % — ABNORMAL HIGH (ref 11.5–15.5)
WBC Count: 19.8 10*3/uL — ABNORMAL HIGH (ref 4.0–10.5)
nRBC: 0 % (ref 0.0–0.2)

## 2019-09-22 LAB — CMP (CANCER CENTER ONLY)
ALT: 20 U/L (ref 0–44)
AST: 18 U/L (ref 15–41)
Albumin: 3.9 g/dL (ref 3.5–5.0)
Alkaline Phosphatase: 97 U/L (ref 38–126)
Anion gap: 10 (ref 5–15)
BUN: 14 mg/dL (ref 6–20)
CO2: 21 mmol/L — ABNORMAL LOW (ref 22–32)
Calcium: 9.2 mg/dL (ref 8.9–10.3)
Chloride: 107 mmol/L (ref 98–111)
Creatinine: 0.92 mg/dL (ref 0.44–1.00)
GFR, Est AFR Am: >60 mL/min (ref >60)
GFR, Estimated: >60 mL/min (ref >60)
Glucose, Bld: 97 mg/dL (ref 70–99)
Potassium: 4 mmol/L (ref 3.5–5.1)
Sodium: 138 mmol/L (ref 135–145)
Total Bilirubin: 0.2 mg/dL — ABNORMAL LOW (ref 0.3–1.2)
Total Protein: 7.2 g/dL (ref 6.5–8.1)

## 2019-09-22 MED ORDER — HEPARIN SOD (PORK) LOCK FLUSH 100 UNIT/ML IV SOLN
500.0000 [IU] | Freq: Once | INTRAVENOUS | Status: AC | PRN
  Administered 2019-09-22: 500 [IU]
  Filled 2019-09-22: qty 5

## 2019-09-22 MED ORDER — SODIUM CHLORIDE 0.9 % IV SOLN
65.0000 mg/m2 | Freq: Once | INTRAVENOUS | Status: AC
  Administered 2019-09-22: 138 mg via INTRAVENOUS
  Filled 2019-09-22: qty 23

## 2019-09-22 MED ORDER — SODIUM CHLORIDE 0.9 % IV SOLN
700.0000 mg | Freq: Once | INTRAVENOUS | Status: AC
  Administered 2019-09-22: 700 mg via INTRAVENOUS
  Filled 2019-09-22: qty 70

## 2019-09-22 MED ORDER — SODIUM CHLORIDE 0.9 % IV SOLN
Freq: Once | INTRAVENOUS | Status: AC
  Filled 2019-09-22: qty 250

## 2019-09-22 MED ORDER — SODIUM CHLORIDE 0.9 % IV SOLN
150.0000 mg | Freq: Once | INTRAVENOUS | Status: AC
  Administered 2019-09-22: 150 mg via INTRAVENOUS
  Filled 2019-09-22: qty 150

## 2019-09-22 MED ORDER — SODIUM CHLORIDE 0.9% FLUSH
10.0000 mL | INTRAVENOUS | Status: DC | PRN
  Administered 2019-09-22: 10 mL
  Filled 2019-09-22: qty 10

## 2019-09-22 MED ORDER — FAMOTIDINE IN NACL 20-0.9 MG/50ML-% IV SOLN
20.0000 mg | Freq: Once | INTRAVENOUS | Status: AC
  Administered 2019-09-22: 20 mg via INTRAVENOUS

## 2019-09-22 MED ORDER — SODIUM CHLORIDE 0.9 % IV SOLN
2.0000 mg/kg | Freq: Once | INTRAVENOUS | Status: AC
  Administered 2019-09-22: 200 mg via INTRAVENOUS
  Filled 2019-09-22: qty 8

## 2019-09-22 MED ORDER — SODIUM CHLORIDE 0.9 % IV SOLN
10.0000 mg | Freq: Once | INTRAVENOUS | Status: AC
  Administered 2019-09-22: 10 mg via INTRAVENOUS
  Filled 2019-09-22: qty 10

## 2019-09-22 MED ORDER — PALONOSETRON HCL INJECTION 0.25 MG/5ML
INTRAVENOUS | Status: AC
  Filled 2019-09-22: qty 5

## 2019-09-22 MED ORDER — FAMOTIDINE IN NACL 20-0.9 MG/50ML-% IV SOLN
INTRAVENOUS | Status: AC
  Filled 2019-09-22: qty 50

## 2019-09-22 MED ORDER — PALONOSETRON HCL INJECTION 0.25 MG/5ML
0.2500 mg | Freq: Once | INTRAVENOUS | Status: AC
  Administered 2019-09-22: 0.25 mg via INTRAVENOUS

## 2019-09-22 NOTE — Assessment & Plan Note (Signed)
/  02/2020:Patient palpated a left breast and axillary mass x33month. [place] the left breast showed a 3.6cm mass at the 1 o'clock position with 6 smaller adjacent masses extending toward the nipple ranging in size from 1.1cm to 4.0cm. [place] the left axilla showed five bulky lymph nodes, the largest measuring 4.2cm, and second largest measuring 2.8cm. 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 T1c N2 stage IIIc Based on PET CT scan showing bilateral cervical nodes, it is stage IV(however her goal is to cure given the oligometastatic nature of her cancer.)  PET CT scan 26/77/3736 Hypermetabolic nodules in the left breast and left axilla and subpectoral adenopathy. Small hypermetabolic lymph nodes in the neck. Marrow activity from G-CSF, thyroid activity possibly from immunotherapy.  MRI of the liver and LN: decided to observe after much   Treatment plan: 1. Neoadjuvant chemotherapy with Adriamycin and Cytoxanevery 3 weeks4 followed byTaxolweekly 12with carboplatin every 3 weeks and based on keynote 522 clinical trial we will add pembrolizumab every 3 weeks(8 cycles every 3 weeks) After she finishes neoadjuvant chemoappearing sternal lesions.  pembrolizumab maintenance every 3 weeks for 9 more cycles 2. Followed bymastectomy andaxillary lymph nodedissection 3. Followed by adjuvant radiation therapy ----------------------------------------------------------------------------------------------------------------------------------------------------- Current treatment:Completed 4 cycles ofAdriamycin and Cytoxan with pembrolizumab,today is cycle5Taxol (with carboplatin and pembrolizumabevery 3 weeks).   Chemo toxicities: 1.Nausea: Mild 2.Oral thrush: Resolved 3.Immune mediated adverse effect: Hypothyroidism:  Follows with endocrinology.    4.Neutropenia: We reduced the dosage of Taxol to 65 mg per metered squared with cycle 3. I  would like to give her a dose of Granix on Tuesdays prior to each treatment.  We will discontinue Benadryl. Return to clinic weekly for chemo and every other week for follow-up with me.

## 2019-09-22 NOTE — Patient Instructions (Signed)
[facility] Discharge Instructions for Patients Receiving Chemotherapy  Today you received the following chemotherapy agents: Keytruda, Carboplatin, & Taxol  To help prevent nausea and vomiting after your treatment, we encourage you to take your nausea medication as directed.   If you develop nausea and vomiting that is not controlled by your nausea medication, call the clinic.   BELOW ARE SYMPTOMS THAT SHOULD BE REPORTED IMMEDIATELY:  *FEVER GREATER THAN 100.5 F  *CHILLS WITH OR WITHOUT FEVER  NAUSEA AND VOMITING THAT IS NOT CONTROLLED WITH YOUR NAUSEA MEDICATION  *UNUSUAL SHORTNESS OF BREATH  *UNUSUAL BRUISING OR BLEEDING  TENDERNESS IN MOUTH AND THROAT WITH OR WITHOUT PRESENCE OF ULCERS  *URINARY PROBLEMS  *BOWEL PROBLEMS  UNUSUAL RASH Items with * indicate a potential emergency and should be followed up as soon as possible.  Feel free to call the clinic should you have any questions or concerns. The clinic phone number is [phone].  Please show the [place] at check-in to the Emergency Department and triage nurse.

## 2019-09-23 LAB — THYROID PANEL WITH TSH
Free Thyroxine Index: 0.6 — ABNORMAL LOW (ref 1.2–4.9)
T3 Uptake Ratio: 13 % — ABNORMAL LOW (ref 24–39)
T4, Total: 4.9 ug/dL (ref 4.5–12.0)
TSH: 65.6 u[IU]/mL — ABNORMAL HIGH (ref 0.450–4.500)

## 2019-09-29 ENCOUNTER — Inpatient Hospital Stay: Payer: 59

## 2019-09-29 ENCOUNTER — Encounter: Payer: Self-pay | Admitting: *Deleted

## 2019-09-29 ENCOUNTER — Other Ambulatory Visit: Payer: Self-pay

## 2019-09-29 VITALS — BP 114/79 | HR 89 | Temp 98.6°F | Resp 18 | Wt 218.5 lb

## 2019-09-29 DIAGNOSIS — Z5112 Encounter for antineoplastic immunotherapy: Secondary | ICD-10-CM | POA: Diagnosis not present

## 2019-09-29 DIAGNOSIS — Z171 Estrogen receptor negative status [ER-]: Secondary | ICD-10-CM

## 2019-09-29 DIAGNOSIS — C50812 Malignant neoplasm of overlapping sites of left female breast: Secondary | ICD-10-CM

## 2019-09-29 DIAGNOSIS — Z95828 Presence of other vascular implants and grafts: Secondary | ICD-10-CM

## 2019-09-29 LAB — CBC WITH DIFFERENTIAL (CANCER CENTER ONLY)
Abs Immature Granulocytes: 0.01 10*3/uL (ref 0.00–0.07)
Basophils Absolute: 0 10*3/uL (ref 0.0–0.1)
Basophils Relative: 1 %
Eosinophils Absolute: 0.1 10*3/uL (ref 0.0–0.5)
Eosinophils Relative: 4 %
HCT: 30.3 % — ABNORMAL LOW (ref 36.0–46.0)
Hemoglobin: 10 g/dL — ABNORMAL LOW (ref 12.0–15.0)
Immature Granulocytes: 0 %
Lymphocytes Relative: 45 %
Lymphs Abs: 1.3 10*3/uL (ref 0.7–4.0)
MCH: 32.1 pg (ref 26.0–34.0)
MCHC: 33 g/dL (ref 30.0–36.0)
MCV: 97.1 fL (ref 80.0–100.0)
Monocytes Absolute: 0.2 10*3/uL (ref 0.1–1.0)
Monocytes Relative: 6 %
Neutro Abs: 1.4 10*3/uL — ABNORMAL LOW (ref 1.7–7.7)
Neutrophils Relative %: 44 %
Platelet Count: 240 10*3/uL (ref 150–400)
RBC: 3.12 MIL/uL — ABNORMAL LOW (ref 3.87–5.11)
RDW: 15.6 % — ABNORMAL HIGH (ref 11.5–15.5)
WBC Count: 3 10*3/uL — ABNORMAL LOW (ref 4.0–10.5)
nRBC: 0 % (ref 0.0–0.2)

## 2019-09-29 LAB — CMP (CANCER CENTER ONLY)
ALT: 18 U/L (ref 0–44)
AST: 19 U/L (ref 15–41)
Albumin: 3.7 g/dL (ref 3.5–5.0)
Alkaline Phosphatase: 87 U/L (ref 38–126)
Anion gap: 10 (ref 5–15)
BUN: 17 mg/dL (ref 6–20)
CO2: 25 mmol/L (ref 22–32)
Calcium: 9.5 mg/dL (ref 8.9–10.3)
Chloride: 106 mmol/L (ref 98–111)
Creatinine: 0.8 mg/dL (ref 0.44–1.00)
GFR, Est AFR Am: >60 mL/min (ref >60)
GFR, Estimated: >60 mL/min (ref >60)
Glucose, Bld: 95 mg/dL (ref 70–99)
Potassium: 3.8 mmol/L (ref 3.5–5.1)
Sodium: 141 mmol/L (ref 135–145)
Total Bilirubin: <0.2 mg/dL — ABNORMAL LOW (ref 0.3–1.2)
Total Protein: 6.9 g/dL (ref 6.5–8.1)

## 2019-09-29 MED ORDER — FAMOTIDINE IN NACL 20-0.9 MG/50ML-% IV SOLN
20.0000 mg | Freq: Once | INTRAVENOUS | Status: AC
  Administered 2019-09-29: 20 mg via INTRAVENOUS

## 2019-09-29 MED ORDER — FAMOTIDINE IN NACL 20-0.9 MG/50ML-% IV SOLN
INTRAVENOUS | Status: AC
  Filled 2019-09-29: qty 50

## 2019-09-29 MED ORDER — HEPARIN SOD (PORK) LOCK FLUSH 100 UNIT/ML IV SOLN
500.0000 [IU] | Freq: Once | INTRAVENOUS | Status: AC | PRN
  Administered 2019-09-29: 500 [IU]
  Filled 2019-09-29: qty 5

## 2019-09-29 MED ORDER — SODIUM CHLORIDE 0.9% FLUSH
10.0000 mL | INTRAVENOUS | Status: DC | PRN
  Administered 2019-09-29: 10 mL
  Filled 2019-09-29: qty 10

## 2019-09-29 MED ORDER — SODIUM CHLORIDE 0.9 % IV SOLN
20.0000 mg | Freq: Once | INTRAVENOUS | Status: AC
  Administered 2019-09-29: 20 mg via INTRAVENOUS
  Filled 2019-09-29: qty 20

## 2019-09-29 MED ORDER — SODIUM CHLORIDE 0.9 % IV SOLN
Freq: Once | INTRAVENOUS | Status: AC
  Filled 2019-09-29: qty 250

## 2019-09-29 MED ORDER — SODIUM CHLORIDE 0.9 % IV SOLN
65.0000 mg/m2 | Freq: Once | INTRAVENOUS | Status: AC
  Administered 2019-09-29: 138 mg via INTRAVENOUS
  Filled 2019-09-29: qty 23

## 2019-09-29 NOTE — Progress Notes (Signed)
Per Dr. Jana Hakim (MD coverage) okay to proceed with treatment today with [place] 1.4.

## 2019-09-29 NOTE — Patient Instructions (Signed)
[facility] Discharge Instructions for Patients Receiving Chemotherapy  Today you received the following chemotherapy agents:  Taxol.  To help prevent nausea and vomiting after your treatment, we encourage you to take your nausea medication as directed.   If you develop nausea and vomiting that is not controlled by your nausea medication, call the clinic.   BELOW ARE SYMPTOMS THAT SHOULD BE REPORTED IMMEDIATELY:  *FEVER GREATER THAN 100.5 F  *CHILLS WITH OR WITHOUT FEVER  NAUSEA AND VOMITING THAT IS NOT CONTROLLED WITH YOUR NAUSEA MEDICATION  *UNUSUAL SHORTNESS OF BREATH  *UNUSUAL BRUISING OR BLEEDING  TENDERNESS IN MOUTH AND THROAT WITH OR WITHOUT PRESENCE OF ULCERS  *URINARY PROBLEMS  *BOWEL PROBLEMS  UNUSUAL RASH Items with * indicate a potential emergency and should be followed up as soon as possible.  Feel free to call the clinic should you have any questions or concerns. The clinic phone number is [phone].  Please show the CHEMO ALERT CARD at check-in to the Emergency Department and triage nurse.   

## 2019-09-30 LAB — THYROID PANEL WITH TSH
Free Thyroxine Index: 0.5 — ABNORMAL LOW (ref 1.2–4.9)
T3 Uptake Ratio: 13 % — ABNORMAL LOW (ref 24–39)
T4, Total: 3.6 ug/dL — ABNORMAL LOW (ref 4.5–12.0)
TSH: 71 u[IU]/mL — ABNORMAL HIGH (ref 0.450–4.500)

## 2019-10-06 ENCOUNTER — Other Ambulatory Visit: Payer: Self-pay | Admitting: *Deleted

## 2019-10-06 ENCOUNTER — Encounter: Payer: Self-pay | Admitting: *Deleted

## 2019-10-06 ENCOUNTER — Other Ambulatory Visit: Payer: Self-pay

## 2019-10-06 DIAGNOSIS — Z171 Estrogen receptor negative status [ER-]: Secondary | ICD-10-CM

## 2019-10-06 NOTE — Progress Notes (Signed)
Patient Care Team: Patient, No Pcp Per as PCP - General (General Practice) [name], RN as Oncology Nurse Navigator [name], RN as Oncology Nurse Navigator [name], MD as Consulting Physician (General Surgery) [name], MD as Consulting Physician (Hematology and Oncology) [name], MD as Consulting Physician (Radiation Oncology)  DIAGNOSIS:    ICD-10-CM   1. Malignant neoplasm of overlapping sites of left breast in female, estrogen receptor negative ([place])  C50.812    Z17.1     SUMMARY OF ONCOLOGIC HISTORY: Oncology History  Malignant neoplasm of overlapping sites of left breast in female, estrogen receptor negative ([place])  04/25/2019 Initial Diagnosis   Patient palpated a left breast and axillary mass x74month. [place] left breast showed a 3.6cm mass at the 1 o'clock position with 6 smaller adjacent masses extending toward the nipple ranging in size from 1.1cm to 4.0cm. [place] the left axilla showed five bulky lymph nodes, the largest measuring 4.2cm, and second largest measuring 2.8cm. 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    04/27/2019 Cancer Staging   Staging form: Breast, AJCC 8th Edition - Clinical: Stage IIIC (cT1c, cN2a, cM0, G3, ER-, PR-, HER2-) - Signed by GNicholas Lose MD on 04/27/2019   05/12/2019 -  Chemotherapy   The patient had DOXOrubicin (ADRIAMYCIN) chemo injection 128 mg, 60 mg/m2 = 128 mg, Intravenous,  Once, 4 of 4 cycles Administration: 128 mg (05/12/2019), 128 mg (06/03/2019), 128 mg (06/23/2019), 128 mg (07/14/2019) palonosetron (ALOXI) injection 0.25 mg, 0.25 mg, Intravenous,  Once, 7 of 8 cycles Administration: 0.25 mg (05/12/2019), 0.25 mg (08/04/2019), 0.25 mg (06/03/2019), 0.25 mg (09/01/2019), 0.25 mg (06/23/2019), 0.25 mg (07/14/2019), 0.25 mg (09/22/2019) pegfilgrastim-cbqv (UDENYCA) injection 6 mg, 6 mg, Subcutaneous, Once, 4 of 4 cycles Administration: 6 mg (05/14/2019), 6 mg  (06/06/2019), 6 mg (06/25/2019), 6 mg (07/16/2019) CARBOplatin (PARAPLATIN) 700 mg in sodium chloride 0.9 % 250 mL chemo infusion, 700 mg (100 % of original dose 700 mg), Intravenous,  Once, 3 of 4 cycles Dose modification: 700 mg (original dose 700 mg, Cycle 5) Administration: 700 mg (08/04/2019), 700 mg (09/01/2019), 700 mg (09/22/2019) cyclophosphamide (CYTOXAN) 1,280 mg in sodium chloride 0.9 % 250 mL chemo infusion, 600 mg/m2 = 1,280 mg, Intravenous,  Once, 4 of 4 cycles Administration: 1,280 mg (05/12/2019), 1,280 mg (06/03/2019), 1,280 mg (06/23/2019), 1,280 mg (07/14/2019) PACLitaxel (TAXOL) 174 mg in sodium chloride 0.9 % 250 mL chemo infusion (</= 848mm2), 80 mg/m2 = 174 mg, Intravenous,  Once, 3 of 4 cycles Dose modification: 65 mg/m2 (original dose 80 mg/m2, Cycle 6, Reason: Dose not tolerated) Administration: 174 mg (08/04/2019), 174 mg (08/11/2019), 138 mg (09/01/2019), 138 mg (08/25/2019), 138 mg (09/08/2019), 138 mg (09/15/2019), 138 mg (09/22/2019), 138 mg (09/29/2019) fosaprepitant (EMEND) 150 mg in sodium chloride 0.9 % 145 mL IVPB, 150 mg, Intravenous,  Once, 7 of 8 cycles Administration: 150 mg (05/12/2019), 150 mg (08/04/2019), 150 mg (06/03/2019), 150 mg (09/01/2019), 150 mg (06/23/2019), 150 mg (07/14/2019), 150 mg (09/22/2019) pembrolizumab (KEYTRUDA) 200 mg in sodium chloride 0.9 % 50 mL chemo infusion, 2 mg/kg = 200 mg, Intravenous, Once, 7 of 7 cycles Administration: 200 mg (05/12/2019), 200 mg (06/03/2019), 200 mg (06/23/2019), 200 mg (07/14/2019), 200 mg (08/04/2019), 200 mg (09/01/2019), 200 mg (09/22/2019)  for chemotherapy treatment.     Genetic Testing   Negative genetic testing. No pathogenic variants identified. VUS in HOXB13 called c.473C>A, VUS in POLD1 called c.1610G>C, and  VUS in RAD50 called c.1094G>A identified on the [facility] Common Hereditary Cancers Panel. The report date is 05/10/2019.  The Common Hereditary Cancers Panel offered by [facility] includes sequencing and/or deletion duplication  testing of the following 48 genes: APC, ATM, AXIN2, BARD1, BMPR1A, BRCA1, BRCA2, BRIP1, CDH1, CDKN2A (p14ARF), CDKN2A (p16INK4a), CKD4, CHEK2, CTNNA1, DICER1, EPCAM (Deletion/duplication testing only), GREM1 (promoter region deletion/duplication testing only), KIT, MEN1, MLH1, MSH2, MSH3, MSH6, MUTYH, NBN, NF1, NHTL1, PALB2, PDGFRA, PMS2, POLD1, POLE, PTEN, RAD50, RAD51C, RAD51D, RNF43, SDHB, SDHC, SDHD, SMAD4, SMARCA4. STK11, TP53, TSC1, TSC2, and VHL.  The following genes were evaluated for sequence changes only: SDHA and HOXB13 c.251G>A variant only.     CHIEF COMPLIANT: Cycle9Taxol  INTERVAL HISTORY: Heather Mckenzie is a 39 y.o. 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 She is currently on neoadjuvant chemotherapy withweekly Taxol and Carboplatin and pembrolizumab every 3weeks.She presents to the clinic todayfor cycle9.  ALLERGIES:  has No Known Allergies.  MEDICATIONS:  Current Outpatient Medications  Medication Sig Dispense Refill  . fluconazole (DIFLUCAN) 100 MG tablet Take 1 tablet (100 mg total) by mouth daily. 7 tablet 0  . ibuprofen (ADVIL,MOTRIN) 600 MG tablet Take 1 tablet (600 mg total) by mouth every 6 (six) hours as needed for pain. 30 tablet 2  . levonorgestrel-ethinyl estradiol (AMETHYST) 90-20 MCG tablet Take 1 tablet by mouth daily. (28) 90 mcg-20 mcg tablet    . levothyroxine (SYNTHROID) 50 MCG tablet Take 50 mcg by mouth daily.    Marland Kitchen levothyroxine (SYNTHROID) 88 MCG tablet Take 1 tablet (88 mcg total) by mouth daily before breakfast.    . lidocaine-prilocaine (EMLA) cream Apply to affected area once 30 g 3  . LORazepam (ATIVAN) 0.5 MG tablet Take 1 tablet (0.5 mg total) by mouth at bedtime as needed for sleep. 30 tablet 0  . ondansetron (ZOFRAN) 8 MG tablet Take 1 tablet (8 mg total) by mouth 2 (two) times daily as needed. Start on the third day after chemotherapy. 30 tablet 1  . prochlorperazine (COMPAZINE) 10 MG tablet Take 1 tablet (10 mg  total) by mouth every 6 (six) hours as needed (Nausea or vomiting). 30 tablet 1   No current facility-administered medications for this visit.    PHYSICAL EXAMINATION: ECOG PERFORMANCE STATUS: 1 - Symptomatic but completely ambulatory  Vitals:   10/07/19 1002  BP: 123/82  Pulse: 90  Resp: 18  Temp: 98.3 F (36.8 C)  SpO2: 100%   Filed Weights   10/07/19 1002  Weight: 216 lb 9.6 oz (98.2 kg)    LABORATORY DATA:  I have reviewed the data as listed CMP Latest Ref Rng & Units 09/29/2019 09/22/2019 09/15/2019  Glucose 70 - 99 mg/dL 95 97 94  BUN 6 - 20 mg/dL _0 Creatinine 0.44 - 1.00 mg/dL 0.80 0.92 0.88  Sodium 135 - 145 mmol/L 141 138 139  Potassium 3.5 - 5.1 mmol/L 3.8 4.0 4.0  Chloride 98 - 111 mmol/L 106 107 107  CO2 22 - 32 mmol/L 25 21(L) 22  Calcium 8.9 - 10.3 mg/dL 9.5 9.2 9.3  Total Protein 6.5 - 8.1 g/dL 6.9 7.2 7.1  Total Bilirubin 0.3 - 1.2 mg/dL <0.2(L) 0.2(L) 0.2(L)  Alkaline Phos 38 - 126 U/L 87 97 93  AST 15 - 41 U/L _1 ALT 0 - 44 U/L _2 Lab Results  Component Value Date   WBC 2.9 (L) 10/07/2019   HGB 10.1 (L) 10/07/2019  HCT 30.6 (L) 10/07/2019   MCV 99.4 10/07/2019   PLT 261 10/07/2019   NEUTROABS PENDING 10/07/2019    ASSESSMENT & PLAN:  Malignant neoplasm of overlapping sites of left breast in female, estrogen receptor negative ([place]) /02/2020:Patient palpated a left breast and axillary mass x53month. [place] left breast showed a 3.6cm mass at the 1 o'clock position with 6 smaller adjacent masses extending toward the nipple ranging in size from 1.1cm to 4.0cm. [place] the left axilla showed five bulky lymph nodes, the largest measuring 4.2cm, and second largest measuring 2.8cm. 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 T1c N2 stage IIIc Based on PET CT scan showing bilateral cervical nodes, it is stage IV(however her goal is to cure given the oligometastatic nature of her  cancer.)  PET CT scan 24/76/5465 Hypermetabolic nodules in the left breast and left axilla and subpectoral adenopathy. Small hypermetabolic lymph nodes in the neck. Marrow activity from G-CSF, thyroid activity possibly from immunotherapy.  MRI of the liver and LN: decided to observe after much   Treatment plan: 1. Neoadjuvant chemotherapy with Adriamycin and Cytoxanevery 3 weeks4 followed byTaxolweekly 12with carboplatin every 3 weeks and based on keynote 522 clinical trial we will add pembrolizumab every 3 weeks(8 cycles every 3 weeks) After she finishes neoadjuvant chemoappearing sternal lesions.  pembrolizumab maintenance every 3 weeks for 9 more cycles 2. Followed bymastectomy andaxillary lymph nodedissection 3. Followed by adjuvant radiation therapy ----------------------------------------------------------------------------------------------------------------------------------------------------- Current treatment:Completed 4 cycles ofAdriamycin and Cytoxan with pembrolizumab,today is cycle7Taxol (with carboplatin and pembrolizumabevery 3 weeks).   Chemo toxicities: 1.Nausea: Mild 2.Oral thrush: Resolved 3.Immune mediated adverse effect: Hypothyroidism: Follows with endocrinology.  4.Neutropenia: Wereducedthe dosage of Taxol to 65 mg per metered squared with cycle 3.   Holding Granix because of high white counts. We will resume Granix her white count declines significantly and she cannot receive treatment.   Return to clinic weekly for chemo and every other week for follow-up with me.    No orders of the defined types were placed in this encounter.  The patient has a good understanding of the overall plan. she agrees with it. she will call with any problems that may develop before the next visit here.  Total time spent: 30 mins including face to face time and time spent for planning, charting and coordination of care  GNicholas Lose  MD 10/07/2019  I, MCloyde ReamsDorshimer, am acting as scribe for Dr. VNicholas Lose  I have reviewed the above documentation for accuracy and completeness, and I agree with the above.      If she can do tomorrow if it is not too bad

## 2019-10-07 ENCOUNTER — Inpatient Hospital Stay: Payer: 59

## 2019-10-07 ENCOUNTER — Inpatient Hospital Stay (HOSPITAL_BASED_OUTPATIENT_CLINIC_OR_DEPARTMENT_OTHER): Payer: 59 | Admitting: Hematology and Oncology

## 2019-10-07 ENCOUNTER — Other Ambulatory Visit: Payer: Self-pay

## 2019-10-07 DIAGNOSIS — Z95828 Presence of other vascular implants and grafts: Secondary | ICD-10-CM

## 2019-10-07 DIAGNOSIS — Z171 Estrogen receptor negative status [ER-]: Secondary | ICD-10-CM | POA: Diagnosis not present

## 2019-10-07 DIAGNOSIS — C50812 Malignant neoplasm of overlapping sites of left female breast: Secondary | ICD-10-CM

## 2019-10-07 DIAGNOSIS — Z5112 Encounter for antineoplastic immunotherapy: Secondary | ICD-10-CM | POA: Diagnosis not present

## 2019-10-07 LAB — CMP (CANCER CENTER ONLY)
ALT: 19 U/L (ref 0–44)
AST: 22 U/L (ref 15–41)
Albumin: 3.7 g/dL (ref 3.5–5.0)
Alkaline Phosphatase: 76 U/L (ref 38–126)
Anion gap: 12 (ref 5–15)
BUN: 12 mg/dL (ref 6–20)
CO2: 22 mmol/L (ref 22–32)
Calcium: 9.1 mg/dL (ref 8.9–10.3)
Chloride: 106 mmol/L (ref 98–111)
Creatinine: 0.85 mg/dL (ref 0.44–1.00)
GFR, Est AFR Am: >60 mL/min (ref >60)
GFR, Estimated: >60 mL/min (ref >60)
Glucose, Bld: 91 mg/dL (ref 70–99)
Potassium: 3.9 mmol/L (ref 3.5–5.1)
Sodium: 140 mmol/L (ref 135–145)
Total Bilirubin: 0.2 mg/dL — ABNORMAL LOW (ref 0.3–1.2)
Total Protein: 7 g/dL (ref 6.5–8.1)

## 2019-10-07 LAB — CBC WITH DIFFERENTIAL (CANCER CENTER ONLY)
Abs Immature Granulocytes: 0 10*3/uL (ref 0.00–0.07)
Basophils Absolute: 0 10*3/uL (ref 0.0–0.1)
Basophils Relative: 1 %
Eosinophils Absolute: 0.1 10*3/uL (ref 0.0–0.5)
Eosinophils Relative: 2 %
HCT: 30.6 % — ABNORMAL LOW (ref 36.0–46.0)
Hemoglobin: 10.1 g/dL — ABNORMAL LOW (ref 12.0–15.0)
Immature Granulocytes: 0 %
Lymphocytes Relative: 41 %
Lymphs Abs: 1.2 10*3/uL (ref 0.7–4.0)
MCH: 32.8 pg (ref 26.0–34.0)
MCHC: 33 g/dL (ref 30.0–36.0)
MCV: 99.4 fL (ref 80.0–100.0)
Monocytes Absolute: 0.3 10*3/uL (ref 0.1–1.0)
Monocytes Relative: 10 %
Neutro Abs: 1.3 10*3/uL — ABNORMAL LOW (ref 1.7–7.7)
Neutrophils Relative %: 46 %
Platelet Count: 261 10*3/uL (ref 150–400)
RBC: 3.08 MIL/uL — ABNORMAL LOW (ref 3.87–5.11)
RDW: 15.8 % — ABNORMAL HIGH (ref 11.5–15.5)
WBC Count: 2.9 10*3/uL — ABNORMAL LOW (ref 4.0–10.5)
nRBC: 0 % (ref 0.0–0.2)

## 2019-10-07 MED ORDER — SODIUM CHLORIDE 0.9 % IV SOLN
20.0000 mg | Freq: Once | INTRAVENOUS | Status: AC
  Administered 2019-10-07: 20 mg via INTRAVENOUS
  Filled 2019-10-07: qty 20

## 2019-10-07 MED ORDER — SODIUM CHLORIDE 0.9 % IV SOLN
65.0000 mg/m2 | Freq: Once | INTRAVENOUS | Status: AC
  Administered 2019-10-07: 138 mg via INTRAVENOUS
  Filled 2019-10-07: qty 23

## 2019-10-07 MED ORDER — HEPARIN SOD (PORK) LOCK FLUSH 100 UNIT/ML IV SOLN
500.0000 [IU] | Freq: Once | INTRAVENOUS | Status: AC | PRN
  Administered 2019-10-07: 500 [IU]
  Filled 2019-10-07: qty 5

## 2019-10-07 MED ORDER — SODIUM CHLORIDE 0.9 % IV SOLN
Freq: Once | INTRAVENOUS | Status: AC
  Filled 2019-10-07: qty 250

## 2019-10-07 MED ORDER — FAMOTIDINE IN NACL 20-0.9 MG/50ML-% IV SOLN
INTRAVENOUS | Status: AC
  Filled 2019-10-07: qty 50

## 2019-10-07 MED ORDER — SODIUM CHLORIDE 0.9% FLUSH
10.0000 mL | INTRAVENOUS | Status: DC | PRN
  Administered 2019-10-07: 10 mL
  Filled 2019-10-07: qty 10

## 2019-10-07 MED ORDER — FAMOTIDINE IN NACL 20-0.9 MG/50ML-% IV SOLN
20.0000 mg | Freq: Once | INTRAVENOUS | Status: AC
  Administered 2019-10-07: 20 mg via INTRAVENOUS

## 2019-10-07 NOTE — Assessment & Plan Note (Signed)
/  02/2020:Patient palpated a left breast and axillary mass x41month. [place] the left breast showed a 3.6cm mass at the 1 o'clock position with 6 smaller adjacent masses extending toward the nipple ranging in size from 1.1cm to 4.0cm. [place] the left axilla showed five bulky lymph nodes, the largest measuring 4.2cm, and second largest measuring 2.8cm. 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 T1c N2 stage IIIc Based on PET CT scan showing bilateral cervical nodes, it is stage IV(however her goal is to cure given the oligometastatic nature of her cancer.)  PET CT scan 22/04/69 Hypermetabolic nodules in the left breast and left axilla and subpectoral adenopathy. Small hypermetabolic lymph nodes in the neck. Marrow activity from G-CSF, thyroid activity possibly from immunotherapy.  MRI of the liver and LN: decided to observe after much   Treatment plan: 1. Neoadjuvant chemotherapy with Adriamycin and Cytoxanevery 3 weeks4 followed byTaxolweekly 12with carboplatin every 3 weeks and based on keynote 522 clinical trial we will add pembrolizumab every 3 weeks(8 cycles every 3 weeks) After she finishes neoadjuvant chemoappearing sternal lesions.  pembrolizumab maintenance every 3 weeks for 9 more cycles 2. Followed bymastectomy andaxillary lymph nodedissection 3. Followed by adjuvant radiation therapy ----------------------------------------------------------------------------------------------------------------------------------------------------- Current treatment:Completed 4 cycles ofAdriamycin and Cytoxan with pembrolizumab,today is cycle7Taxol (with carboplatin and pembrolizumabevery 3 weeks).   Chemo toxicities: 1.Nausea: Mild 2.Oral thrush: Resolved 3.Immune mediated adverse effect: Hypothyroidism: Follows with endocrinology.  4.Neutropenia: Wereducedthe dosage of Taxol to 65 mg per metered squared with cycle 3.    Holding Granix because of high white counts. We can resume Granix if her white count declines significantly and she cannot receive treatment.   Return to clinic weekly for chemo and every other week for follow-up with me.

## 2019-10-07 NOTE — Progress Notes (Signed)
OK to treat today per Dr Lindi Adie. ANC 1.3

## 2019-10-07 NOTE — Patient Instructions (Signed)
[facility] Discharge Instructions for Patients Receiving Chemotherapy  Today you received the following chemotherapy agents:  Taxol.  To help prevent nausea and vomiting after your treatment, we encourage you to take your nausea medication as directed.   If you develop nausea and vomiting that is not controlled by your nausea medication, call the clinic.   BELOW ARE SYMPTOMS THAT SHOULD BE REPORTED IMMEDIATELY:  *FEVER GREATER THAN 100.5 F  *CHILLS WITH OR WITHOUT FEVER  NAUSEA AND VOMITING THAT IS NOT CONTROLLED WITH YOUR NAUSEA MEDICATION  *UNUSUAL SHORTNESS OF BREATH  *UNUSUAL BRUISING OR BLEEDING  TENDERNESS IN MOUTH AND THROAT WITH OR WITHOUT PRESENCE OF ULCERS  *URINARY PROBLEMS  *BOWEL PROBLEMS  UNUSUAL RASH Items with * indicate a potential emergency and should be followed up as soon as possible.  Feel free to call the clinic should you have any questions or concerns. The clinic phone number is [phone].  Please show the CHEMO ALERT CARD at check-in to the Emergency Department and triage nurse.   

## 2019-10-08 LAB — THYROID PANEL WITH TSH
Free Thyroxine Index: 0.5 — ABNORMAL LOW (ref 1.2–4.9)
T3 Uptake Ratio: 13 % — ABNORMAL LOW (ref 24–39)
T4, Total: 3.5 ug/dL — ABNORMAL LOW (ref 4.5–12.0)
TSH: 74.3 u[IU]/mL — ABNORMAL HIGH (ref 0.450–4.500)

## 2019-10-10 ENCOUNTER — Encounter: Payer: Self-pay | Admitting: *Deleted

## 2019-10-11 ENCOUNTER — Inpatient Hospital Stay: Payer: 59

## 2019-10-11 ENCOUNTER — Other Ambulatory Visit: Payer: Self-pay

## 2019-10-11 ENCOUNTER — Telehealth: Payer: Self-pay | Admitting: Hematology and Oncology

## 2019-10-11 VITALS — BP 111/72 | HR 82 | Temp 98.0°F

## 2019-10-11 DIAGNOSIS — Z5112 Encounter for antineoplastic immunotherapy: Secondary | ICD-10-CM | POA: Diagnosis not present

## 2019-10-11 DIAGNOSIS — Z95828 Presence of other vascular implants and grafts: Secondary | ICD-10-CM

## 2019-10-11 MED ORDER — FILGRASTIM-SNDZ 480 MCG/0.8ML IJ SOSY
480.0000 ug | PREFILLED_SYRINGE | Freq: Once | INTRAMUSCULAR | Status: AC
  Administered 2019-10-11: 480 ug via SUBCUTANEOUS

## 2019-10-11 MED ORDER — FILGRASTIM-SNDZ 480 MCG/0.8ML IJ SOSY
PREFILLED_SYRINGE | INTRAMUSCULAR | Status: AC
  Filled 2019-10-11: qty 0.8

## 2019-10-11 NOTE — Telephone Encounter (Signed)
Scheduled per 6/25 los. Called and spoke with patient, confirmed appts

## 2019-10-11 NOTE — Patient Instructions (Signed)

## 2019-10-12 NOTE — Progress Notes (Signed)
Heather Mckenzie Care Team: Heather Mckenzie, No Pcp Per as PCP - General (General Practice) [name], RN as Oncology Nurse Navigator [name], RN as Oncology Nurse Navigator [name], MD as Consulting Physician (General Surgery) [name], MD as Consulting Physician (Hematology and Oncology) [name], MD as Consulting Physician (Radiation Oncology)  DIAGNOSIS:    ICD-10-CM   1. Malignant neoplasm of overlapping sites of left breast in female, estrogen receptor negative ([place])  C50.812    Z17.1     SUMMARY OF ONCOLOGIC HISTORY: Oncology History  Malignant neoplasm of overlapping sites of left breast in female, estrogen receptor negative ([place])  04/25/2019 Initial Diagnosis   Heather Mckenzie palpated a left breast and axillary mass x25month. [place] left breast showed a 3.6cm mass at the 1 o'clock position with 6 smaller adjacent masses extending toward the nipple ranging in size from 1.1cm to 4.0cm. [place] the left axilla showed five bulky lymph nodes, the largest measuring 4.2cm, and second largest measuring 2.8cm. 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    04/27/2019 Cancer Staging   Staging form: Breast, AJCC 8th Edition - Clinical: Stage IIIC (cT1c, cN2a, cM0, G3, ER-, PR-, HER2-) - Signed by GNicholas Lose MD on 04/27/2019   05/12/2019 -  Chemotherapy   The Heather Mckenzie had DOXOrubicin (ADRIAMYCIN) chemo injection 128 mg, 60 mg/m2 = 128 mg, Intravenous,  Once, 4 of 4 cycles Administration: 128 mg (05/12/2019), 128 mg (06/03/2019), 128 mg (06/23/2019), 128 mg (07/14/2019) palonosetron (ALOXI) injection 0.25 mg, 0.25 mg, Intravenous,  Once, 8 of 8 cycles Administration: 0.25 mg (05/12/2019), 0.25 mg (08/04/2019), 0.25 mg (06/03/2019), 0.25 mg (09/01/2019), 0.25 mg (06/23/2019), 0.25 mg (07/14/2019), 0.25 mg (09/22/2019) pegfilgrastim-cbqv (UDENYCA) injection 6 mg, 6 mg, Subcutaneous, Once, 4 of 4 cycles Administration: 6 mg (05/14/2019), 6 mg  (06/06/2019), 6 mg (06/25/2019), 6 mg (07/16/2019) CARBOplatin (PARAPLATIN) 700 mg in sodium chloride 0.9 % 250 mL chemo infusion, 700 mg (100 % of original dose 700 mg), Intravenous,  Once, 4 of 4 cycles Dose modification: 700 mg (original dose 700 mg, Cycle 5) Administration: 700 mg (08/04/2019), 700 mg (09/01/2019), 700 mg (09/22/2019) cyclophosphamide (CYTOXAN) 1,280 mg in sodium chloride 0.9 % 250 mL chemo infusion, 600 mg/m2 = 1,280 mg, Intravenous,  Once, 4 of 4 cycles Administration: 1,280 mg (05/12/2019), 1,280 mg (06/03/2019), 1,280 mg (06/23/2019), 1,280 mg (07/14/2019) PACLitaxel (TAXOL) 174 mg in sodium chloride 0.9 % 250 mL chemo infusion (</= 835mm2), 80 mg/m2 = 174 mg, Intravenous,  Once, 4 of 4 cycles Dose modification: 65 mg/m2 (original dose 80 mg/m2, Cycle 6, Reason: Dose not tolerated) Administration: 174 mg (08/04/2019), 174 mg (08/11/2019), 138 mg (09/01/2019), 138 mg (08/25/2019), 138 mg (09/08/2019), 138 mg (09/15/2019), 138 mg (09/22/2019), 138 mg (09/29/2019), 138 mg (10/07/2019) fosaprepitant (EMEND) 150 mg in sodium chloride 0.9 % 145 mL IVPB, 150 mg, Intravenous,  Once, 8 of 8 cycles Administration: 150 mg (05/12/2019), 150 mg (08/04/2019), 150 mg (06/03/2019), 150 mg (09/01/2019), 150 mg (06/23/2019), 150 mg (07/14/2019), 150 mg (09/22/2019) pembrolizumab (KEYTRUDA) 200 mg in sodium chloride 0.9 % 50 mL chemo infusion, 2 mg/kg = 200 mg, Intravenous, Once, 7 of 7 cycles Administration: 200 mg (05/12/2019), 200 mg (06/03/2019), 200 mg (06/23/2019), 200 mg (07/14/2019), 200 mg (08/04/2019), 200 mg (09/01/2019), 200 mg (09/22/2019)  for chemotherapy treatment.     Genetic Testing   Negative genetic testing. No pathogenic variants identified. VUS in HOXB13 called c.473C>A, VUS in POLD1  called c.1610G>C, and VUS in RAD50 called c.1094G>A identified on the [facility] Common Hereditary Cancers Panel. The report date is 05/10/2019.  The Common Hereditary Cancers Panel offered by [facility] includes sequencing and/or  deletion duplication testing of the following 48 genes: APC, ATM, AXIN2, BARD1, BMPR1A, BRCA1, BRCA2, BRIP1, CDH1, CDKN2A (p14ARF), CDKN2A (p16INK4a), CKD4, CHEK2, CTNNA1, DICER1, EPCAM (Deletion/duplication testing only), GREM1 (promoter region deletion/duplication testing only), KIT, MEN1, MLH1, MSH2, MSH3, MSH6, MUTYH, NBN, NF1, NHTL1, PALB2, PDGFRA, PMS2, POLD1, POLE, PTEN, RAD50, RAD51C, RAD51D, RNF43, SDHB, SDHC, SDHD, SMAD4, SMARCA4. STK11, TP53, TSC1, TSC2, and VHL.  The following genes were evaluated for sequence changes only: SDHA and HOXB13 c.251G>A variant only.     CHIEF COMPLIANT: Cycle10Taxol  INTERVAL HISTORY: Ruhee Enck is a 39 y.o. 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 She is currently on neoadjuvant chemotherapy withweekly Taxol and Carboplatin and pembrolizumab every 3weeks.She presents to the clinic todayfor cycle10.  She reports no worsening of neuropathy.  She has intermittent numbness in the toes which goes away.  She is going to see endocrinology next week.  Denies any nausea or vomiting.  Energy levels are excellent.  ALLERGIES:  has No Known Allergies.  MEDICATIONS:  Current Outpatient Medications  Medication Sig Dispense Refill  . fluconazole (DIFLUCAN) 100 MG tablet Take 1 tablet (100 mg total) by mouth daily. 7 tablet 0  . ibuprofen (ADVIL,MOTRIN) 600 MG tablet Take 1 tablet (600 mg total) by mouth every 6 (six) hours as needed for pain. 30 tablet 2  . levonorgestrel-ethinyl estradiol (AMETHYST) 90-20 MCG tablet Take 1 tablet by mouth daily. (28) 90 mcg-20 mcg tablet    . levothyroxine (SYNTHROID) 50 MCG tablet Take 50 mcg by mouth daily.    Marland Kitchen levothyroxine (SYNTHROID) 88 MCG tablet Take 1 tablet (88 mcg total) by mouth daily before breakfast.    . lidocaine-prilocaine (EMLA) cream Apply to affected area once 30 g 3  . LORazepam (ATIVAN) 0.5 MG tablet Take 1 tablet (0.5 mg total) by mouth at bedtime as needed for sleep. 30  tablet 0  . ondansetron (ZOFRAN) 8 MG tablet Take 1 tablet (8 mg total) by mouth 2 (two) times daily as needed. Start on the third day after chemotherapy. 30 tablet 1  . prochlorperazine (COMPAZINE) 10 MG tablet Take 1 tablet (10 mg total) by mouth every 6 (six) hours as needed (Nausea or vomiting). 30 tablet 1   No current facility-administered medications for this visit.   Facility-Administered Medications Ordered in Other Visits  Medication Dose Route Frequency Provider Last Rate Last Admin  . CARBOplatin (PARAPLATIN) 700 mg in sodium chloride 0.9 % 250 mL chemo infusion  700 mg Intravenous Once [name], MD      . dexamethasone (DECADRON) 10 mg in sodium chloride 0.9 % 50 mL IVPB  10 mg Intravenous Once [name], MD      . famotidine (PEPCID) IVPB 20 mg premix  20 mg Intravenous Once [name], MD      . fosaprepitant (EMEND) 150 mg in sodium chloride 0.9 % 145 mL IVPB  150 mg Intravenous Once [name], MD      . heparin lock flush 100 unit/mL  500 Units Intracatheter Once PRN [name], MD      . PACLitaxel (TAXOL) 138 mg in sodium chloride 0.9 % 250 mL chemo infusion (</= 12m/m2)  65 mg/m2 (Treatment Plan Recorded) Intravenous Once GNicholas Lose MD      . sodium chloride flush (NS) 0.9 %  injection 10 mL  10 mL Intracatheter PRN [name], MD        PHYSICAL EXAMINATION: ECOG PERFORMANCE STATUS: 1 - Symptomatic but completely ambulatory  Vitals:   10/13/19 1145  BP: 114/78  Pulse: 99  SpO2: 100%   Filed Weights   10/13/19 1145  Weight: 219 lb (99.3 kg)    LABORATORY DATA:  I have reviewed the data as listed CMP Latest Ref Rng & Units 10/13/2019 10/07/2019 09/29/2019  Glucose 70 - 99 mg/dL 91 91 95  BUN 6 - 20 mg/dL '13 12 17  ' Creatinine 0.44 - 1.00 mg/dL 0.79 0.85 0.80  Sodium 135 - 145 mmol/L 140 140 141  Potassium 3.5 - 5.1 mmol/L 4.0 3.9 3.8  Chloride 98 - 111 mmol/L 105 106 106  CO2 22 - 32 mmol/L '24 22 25  ' Calcium 8.9 - 10.3 mg/dL 9.1 9.1 9.5   Total Protein 6.5 - 8.1 g/dL 7.1 7.0 6.9  Total Bilirubin 0.3 - 1.2 mg/dL 0.3 0.2(L) <0.2(L)  Alkaline Phos 38 - 126 U/L 90 76 87  AST 15 - 41 U/L '16 22 19  ' ALT 0 - 44 U/L '17 19 18    ' Lab Results  Component Value Date   WBC 16.0 (H) 10/13/2019   HGB 10.0 (L) 10/13/2019   HCT 30.5 (L) 10/13/2019   MCV 98.4 10/13/2019   PLT 165 10/13/2019   NEUTROABS 12.6 (H) 10/13/2019    ASSESSMENT & PLAN:  Malignant neoplasm of overlapping sites of left breast in female, estrogen receptor negative ([place]) 04/25/2019:Heather Mckenzie palpated a left breast and axillary mass x52month. [place] the left breast showed a 3.6cm mass at the 1 o'clock position with 6 smaller adjacent masses extending toward the nipple ranging in size from 1.1cm to 4.0cm. [place] the left axilla showed five bulky lymph nodes, the largest measuring 4.2cm, and second largest measuring 2.8cm. 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 T1c N2 stage IIIc Based on PET CT scan showing bilateral cervical nodes, it is stage IV(however her goal is to cure given the oligometastatic nature of her cancer.)  PET CT scan 23/22/0254 Hypermetabolic nodules in the left breast and left axilla and subpectoral adenopathy. Small hypermetabolic lymph nodes in the neck. Marrow activity from G-CSF, thyroid activity possibly from immunotherapy.  MRI of the liver and LN: decided to observe after much   Treatment plan: 1. Neoadjuvant chemotherapy with Adriamycin and Cytoxanevery 3 weeks4 followed byTaxolweekly 12with carboplatin every 3 weeks and based on keynote 522 clinical trial we will add pembrolizumab every 3 weeks(8 cycles every 3 weeks) After she finishes neoadjuvant chemoappearing sternal lesions.  pembrolizumab maintenance every 3 weeks for 9 more cycles 2. Followed bymastectomy andaxillary lymph nodedissection 3. Followed by adjuvant radiation  therapy ----------------------------------------------------------------------------------------------------------------------------------------------------- Current treatment:Completed 4 cycles ofAdriamycin and Cytoxan with pembrolizumab,today is cycle10Taxol (with carboplatin and pembrolizumabevery 3 weeks).   Chemo toxicities: 1.Nausea: Mild 2.Oral thrush: Resolved 3.Immune mediated adverse effect: Hypothyroidism:Follows with endocrinology. 4.Neutropenia: Resume Granix and today her white count is normal.  Heather Mckenzie has been set up for breast MRI. She is awaiting appointments with her surgeon.  Return to clinic weekly for chemo and every other week for follow-up with me.    No orders of the defined types were placed in this encounter.  The Heather Mckenzie has a good understanding of the overall plan. she agrees with it. she will call with any problems that may develop before the next visit here.  Total time spent: 30 mins including face to face time and time spent for planning, charting and coordination of care  [name], MD 10/13/2019  I, Cloyde Reams Dorshimer, am acting as scribe for Dr. [name].  I have reviewed the above documentation for accuracy and completeness, and I agree with the above.

## 2019-10-13 ENCOUNTER — Other Ambulatory Visit: Payer: Self-pay

## 2019-10-13 ENCOUNTER — Telehealth: Payer: Self-pay | Admitting: Hematology and Oncology

## 2019-10-13 ENCOUNTER — Inpatient Hospital Stay: Payer: 59 | Attending: Hematology and Oncology

## 2019-10-13 ENCOUNTER — Inpatient Hospital Stay (HOSPITAL_BASED_OUTPATIENT_CLINIC_OR_DEPARTMENT_OTHER): Payer: 59 | Admitting: Hematology and Oncology

## 2019-10-13 ENCOUNTER — Inpatient Hospital Stay: Payer: 59

## 2019-10-13 DIAGNOSIS — Z5112 Encounter for antineoplastic immunotherapy: Secondary | ICD-10-CM | POA: Diagnosis not present

## 2019-10-13 DIAGNOSIS — Z5189 Encounter for other specified aftercare: Secondary | ICD-10-CM | POA: Insufficient documentation

## 2019-10-13 DIAGNOSIS — Z95828 Presence of other vascular implants and grafts: Secondary | ICD-10-CM

## 2019-10-13 DIAGNOSIS — C50812 Malignant neoplasm of overlapping sites of left female breast: Secondary | ICD-10-CM | POA: Diagnosis not present

## 2019-10-13 DIAGNOSIS — Z171 Estrogen receptor negative status [ER-]: Secondary | ICD-10-CM | POA: Diagnosis not present

## 2019-10-13 DIAGNOSIS — Z5111 Encounter for antineoplastic chemotherapy: Secondary | ICD-10-CM | POA: Diagnosis present

## 2019-10-13 LAB — CBC WITH DIFFERENTIAL (CANCER CENTER ONLY)
Abs Immature Granulocytes: 0 10*3/uL (ref 0.00–0.07)
Band Neutrophils: 2 %
Basophils Absolute: 0 10*3/uL (ref 0.0–0.1)
Basophils Relative: 0 %
Eosinophils Absolute: 0 10*3/uL (ref 0.0–0.5)
Eosinophils Relative: 0 %
HCT: 30.5 % — ABNORMAL LOW (ref 36.0–46.0)
Hemoglobin: 10 g/dL — ABNORMAL LOW (ref 12.0–15.0)
Lymphocytes Relative: 18 %
Lymphs Abs: 2.9 10*3/uL (ref 0.7–4.0)
MCH: 32.3 pg (ref 26.0–34.0)
MCHC: 32.8 g/dL (ref 30.0–36.0)
MCV: 98.4 fL (ref 80.0–100.0)
Monocytes Absolute: 0.5 10*3/uL (ref 0.1–1.0)
Monocytes Relative: 3 %
Neutro Abs: 12.6 10*3/uL — ABNORMAL HIGH (ref 1.7–7.7)
Neutrophils Relative %: 77 %
Platelet Count: 165 10*3/uL (ref 150–400)
RBC: 3.1 MIL/uL — ABNORMAL LOW (ref 3.87–5.11)
RDW: 15.9 % — ABNORMAL HIGH (ref 11.5–15.5)
WBC Count: 16 10*3/uL — ABNORMAL HIGH (ref 4.0–10.5)
nRBC: 0 % (ref 0.0–0.2)

## 2019-10-13 LAB — CMP (CANCER CENTER ONLY)
ALT: 17 U/L (ref 0–44)
AST: 16 U/L (ref 15–41)
Albumin: 3.7 g/dL (ref 3.5–5.0)
Alkaline Phosphatase: 90 U/L (ref 38–126)
Anion gap: 11 (ref 5–15)
BUN: 13 mg/dL (ref 6–20)
CO2: 24 mmol/L (ref 22–32)
Calcium: 9.1 mg/dL (ref 8.9–10.3)
Chloride: 105 mmol/L (ref 98–111)
Creatinine: 0.79 mg/dL (ref 0.44–1.00)
GFR, Est AFR Am: >60 mL/min (ref >60)
GFR, Estimated: >60 mL/min (ref >60)
Glucose, Bld: 91 mg/dL (ref 70–99)
Potassium: 4 mmol/L (ref 3.5–5.1)
Sodium: 140 mmol/L (ref 135–145)
Total Bilirubin: 0.3 mg/dL (ref 0.3–1.2)
Total Protein: 7.1 g/dL (ref 6.5–8.1)

## 2019-10-13 MED ORDER — HEPARIN SOD (PORK) LOCK FLUSH 100 UNIT/ML IV SOLN
500.0000 [IU] | Freq: Once | INTRAVENOUS | Status: AC | PRN
  Administered 2019-10-13: 500 [IU]
  Filled 2019-10-13: qty 5

## 2019-10-13 MED ORDER — SODIUM CHLORIDE 0.9% FLUSH
10.0000 mL | INTRAVENOUS | Status: DC | PRN
  Administered 2019-10-13: 10 mL
  Filled 2019-10-13: qty 10

## 2019-10-13 MED ORDER — SODIUM CHLORIDE 0.9 % IV SOLN
Freq: Once | INTRAVENOUS | Status: AC
  Filled 2019-10-13: qty 250

## 2019-10-13 MED ORDER — FAMOTIDINE IN NACL 20-0.9 MG/50ML-% IV SOLN
20.0000 mg | Freq: Once | INTRAVENOUS | Status: AC
  Administered 2019-10-13: 20 mg via INTRAVENOUS

## 2019-10-13 MED ORDER — PALONOSETRON HCL INJECTION 0.25 MG/5ML
INTRAVENOUS | Status: AC
  Filled 2019-10-13: qty 5

## 2019-10-13 MED ORDER — SODIUM CHLORIDE 0.9 % IV SOLN
150.0000 mg | Freq: Once | INTRAVENOUS | Status: AC
  Administered 2019-10-13: 150 mg via INTRAVENOUS
  Filled 2019-10-13: qty 150

## 2019-10-13 MED ORDER — SODIUM CHLORIDE 0.9 % IV SOLN
2.0000 mg/kg | Freq: Once | INTRAVENOUS | Status: AC
  Administered 2019-10-13: 200 mg via INTRAVENOUS
  Filled 2019-10-13: qty 8

## 2019-10-13 MED ORDER — PALONOSETRON HCL INJECTION 0.25 MG/5ML
0.2500 mg | Freq: Once | INTRAVENOUS | Status: AC
  Administered 2019-10-13: 0.25 mg via INTRAVENOUS

## 2019-10-13 MED ORDER — SODIUM CHLORIDE 0.9 % IV SOLN
65.0000 mg/m2 | Freq: Once | INTRAVENOUS | Status: AC
  Administered 2019-10-13: 138 mg via INTRAVENOUS
  Filled 2019-10-13: qty 23

## 2019-10-13 MED ORDER — SODIUM CHLORIDE 0.9 % IV SOLN
10.0000 mg | Freq: Once | INTRAVENOUS | Status: AC
  Administered 2019-10-13: 10 mg via INTRAVENOUS
  Filled 2019-10-13: qty 10

## 2019-10-13 MED ORDER — FAMOTIDINE IN NACL 20-0.9 MG/50ML-% IV SOLN
INTRAVENOUS | Status: AC
  Filled 2019-10-13: qty 50

## 2019-10-13 MED ORDER — SODIUM CHLORIDE 0.9 % IV SOLN
700.0000 mg | Freq: Once | INTRAVENOUS | Status: AC
  Administered 2019-10-13: 700 mg via INTRAVENOUS
  Filled 2019-10-13: qty 70

## 2019-10-13 NOTE — Patient Instructions (Signed)
[facility] Discharge Instructions for Patients Receiving Chemotherapy  Today you received the following chemotherapy agents Taxol and Carboplatin.   To help prevent nausea and vomiting after your treatment, we encourage you to take your nausea medication as prescribed.    If you develop nausea and vomiting that is not controlled by your nausea medication, call the clinic.   BELOW ARE SYMPTOMS THAT SHOULD BE REPORTED IMMEDIATELY:  *FEVER GREATER THAN 100.5 F  *CHILLS WITH OR WITHOUT FEVER  NAUSEA AND VOMITING THAT IS NOT CONTROLLED WITH YOUR NAUSEA MEDICATION  *UNUSUAL SHORTNESS OF BREATH  *UNUSUAL BRUISING OR BLEEDING  TENDERNESS IN MOUTH AND THROAT WITH OR WITHOUT PRESENCE OF ULCERS  *URINARY PROBLEMS  *BOWEL PROBLEMS  UNUSUAL RASH Items with * indicate a potential emergency and should be followed up as soon as possible.  Feel free to call the clinic should you have any questions or concerns. The clinic phone number is [phone].  Please show the CHEMO ALERT CARD at check-in to the Emergency Department and triage nurse.   

## 2019-10-13 NOTE — Progress Notes (Signed)
Per Dr. Lindi Adie, patient to receive [place] today. Her TSH is elevated, she is seeing endocrinologist for this.   Demetrius Charity, PharmD, BCPS, [facility] Pharmacist Pharmacy Phone: [phone] 10/13/2019

## 2019-10-13 NOTE — Progress Notes (Signed)
Received verbal confirmation that the patient is to receive keytruda in addition to her carboplatin and paclitaxel today from Dr. Lindi Adie.

## 2019-10-13 NOTE — Assessment & Plan Note (Signed)
04/25/2019:Patient palpated a left breast and axillary mass x31month. [place] the left breast showed a 3.6cm mass at the 1 o'clock position with 6 smaller adjacent masses extending toward the nipple ranging in size from 1.1cm to 4.0cm. [place] the left axilla showed five bulky lymph nodes, the largest measuring 4.2cm, and second largest measuring 2.8cm. 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 T1c N2 stage IIIc Based on PET CT scan showing bilateral cervical nodes, it is stage IV(however her goal is to cure given the oligometastatic nature of her cancer.)  PET CT scan 24/58/5929 Hypermetabolic nodules in the left breast and left axilla and subpectoral adenopathy. Small hypermetabolic lymph nodes in the neck. Marrow activity from G-CSF, thyroid activity possibly from immunotherapy.  MRI of the liver and LN: decided to observe after much   Treatment plan: 1. Neoadjuvant chemotherapy with Adriamycin and Cytoxanevery 3 weeks4 followed byTaxolweekly 12with carboplatin every 3 weeks and based on keynote 522 clinical trial we will add pembrolizumab every 3 weeks(8 cycles every 3 weeks) After she finishes neoadjuvant chemoappearing sternal lesions.  pembrolizumab maintenance every 3 weeks for 9 more cycles 2. Followed bymastectomy andaxillary lymph nodedissection 3. Followed by adjuvant radiation therapy ----------------------------------------------------------------------------------------------------------------------------------------------------- Current treatment:Completed 4 cycles ofAdriamycin and Cytoxan with pembrolizumab,today is cycle10Taxol (with carboplatin and pembrolizumabevery 3 weeks).   Chemo toxicities: 1.Nausea: Mild 2.Oral thrush: Resolved 3.Immune mediated adverse effect: Hypothyroidism:Follows with endocrinology. 4.Neutropenia: Wereducedthe dosage of Taxol to 65 mg per metered squared with cycle 3.   Holding Granix because of high white counts.We will resume Granix her white count declines significantly and she cannot receive treatment.  Return to clinic weekly for chemo and every other week for follow-up with me.

## 2019-10-13 NOTE — Telephone Encounter (Signed)
No 7/1 los, no changes made to schedule

## 2019-10-14 LAB — THYROID PANEL WITH TSH
Free Thyroxine Index: 0.5 — ABNORMAL LOW (ref 1.2–4.9)
T3 Uptake Ratio: 12 % — ABNORMAL LOW (ref 24–39)
T4, Total: 4.1 ug/dL — ABNORMAL LOW (ref 4.5–12.0)
TSH: 59.9 u[IU]/mL — ABNORMAL HIGH (ref 0.450–4.500)

## 2019-10-18 ENCOUNTER — Inpatient Hospital Stay: Payer: 59

## 2019-10-18 ENCOUNTER — Other Ambulatory Visit: Payer: Self-pay

## 2019-10-18 ENCOUNTER — Telehealth: Payer: Self-pay | Admitting: Hematology and Oncology

## 2019-10-18 VITALS — BP 114/75 | HR 98 | Resp 18

## 2019-10-18 DIAGNOSIS — Z5112 Encounter for antineoplastic immunotherapy: Secondary | ICD-10-CM | POA: Diagnosis not present

## 2019-10-18 DIAGNOSIS — Z95828 Presence of other vascular implants and grafts: Secondary | ICD-10-CM

## 2019-10-18 MED ORDER — FILGRASTIM-SNDZ 480 MCG/0.8ML IJ SOSY
480.0000 ug | PREFILLED_SYRINGE | Freq: Once | INTRAMUSCULAR | Status: AC
  Administered 2019-10-18: 480 ug via SUBCUTANEOUS

## 2019-10-18 MED ORDER — FILGRASTIM-SNDZ 480 MCG/0.8ML IJ SOSY
PREFILLED_SYRINGE | INTRAMUSCULAR | Status: AC
  Filled 2019-10-18: qty 0.8

## 2019-10-18 NOTE — Patient Instructions (Signed)

## 2019-10-18 NOTE — Telephone Encounter (Signed)
ASUORVI:[id number] Faxed medical records to [facility] per, RN Joy D request @ fax # [phone]

## 2019-10-18 NOTE — Progress Notes (Signed)
Per Dr Lindi Adie ok for pt to get Zarxio injection today

## 2019-10-20 ENCOUNTER — Encounter: Payer: Self-pay | Admitting: *Deleted

## 2019-10-21 ENCOUNTER — Other Ambulatory Visit: Payer: Self-pay | Admitting: *Deleted

## 2019-10-21 ENCOUNTER — Other Ambulatory Visit: Payer: Self-pay

## 2019-10-21 ENCOUNTER — Inpatient Hospital Stay: Payer: 59

## 2019-10-21 ENCOUNTER — Encounter: Payer: Self-pay | Admitting: *Deleted

## 2019-10-21 VITALS — BP 120/80 | HR 87 | Temp 98.7°F | Resp 18 | Wt 217.0 lb

## 2019-10-21 DIAGNOSIS — Z95828 Presence of other vascular implants and grafts: Secondary | ICD-10-CM

## 2019-10-21 DIAGNOSIS — Z171 Estrogen receptor negative status [ER-]: Secondary | ICD-10-CM

## 2019-10-21 DIAGNOSIS — Z5112 Encounter for antineoplastic immunotherapy: Secondary | ICD-10-CM | POA: Diagnosis not present

## 2019-10-21 DIAGNOSIS — C50812 Malignant neoplasm of overlapping sites of left female breast: Secondary | ICD-10-CM

## 2019-10-21 DIAGNOSIS — E039 Hypothyroidism, unspecified: Secondary | ICD-10-CM

## 2019-10-21 LAB — CBC WITH DIFFERENTIAL (CANCER CENTER ONLY)
Abs Immature Granulocytes: 0.01 10*3/uL (ref 0.00–0.07)
Basophils Absolute: 0.1 10*3/uL (ref 0.0–0.1)
Basophils Relative: 1 %
Eosinophils Absolute: 0.1 10*3/uL (ref 0.0–0.5)
Eosinophils Relative: 3 %
HCT: 29.8 % — ABNORMAL LOW (ref 36.0–46.0)
Hemoglobin: 9.9 g/dL — ABNORMAL LOW (ref 12.0–15.0)
Immature Granulocytes: 0 %
Lymphocytes Relative: 39 %
Lymphs Abs: 1.6 10*3/uL (ref 0.7–4.0)
MCH: 32.9 pg (ref 26.0–34.0)
MCHC: 33.2 g/dL (ref 30.0–36.0)
MCV: 99 fL (ref 80.0–100.0)
Monocytes Absolute: 0.3 10*3/uL (ref 0.1–1.0)
Monocytes Relative: 8 %
Neutro Abs: 2.1 10*3/uL (ref 1.7–7.7)
Neutrophils Relative %: 49 %
Platelet Count: 224 10*3/uL (ref 150–400)
RBC: 3.01 MIL/uL — ABNORMAL LOW (ref 3.87–5.11)
RDW: 15.2 % (ref 11.5–15.5)
WBC Count: 4.1 10*3/uL (ref 4.0–10.5)
nRBC: 0 % (ref 0.0–0.2)

## 2019-10-21 LAB — CMP (CANCER CENTER ONLY)
ALT: 18 U/L (ref 0–44)
AST: 20 U/L (ref 15–41)
Albumin: 3.7 g/dL (ref 3.5–5.0)
Alkaline Phosphatase: 85 U/L (ref 38–126)
Anion gap: 12 (ref 5–15)
BUN: 16 mg/dL (ref 6–20)
CO2: 25 mmol/L (ref 22–32)
Calcium: 9 mg/dL (ref 8.9–10.3)
Chloride: 103 mmol/L (ref 98–111)
Creatinine: 0.78 mg/dL (ref 0.44–1.00)
GFR, Est AFR Am: >60 mL/min (ref >60)
GFR, Estimated: >60 mL/min (ref >60)
Glucose, Bld: 85 mg/dL (ref 70–99)
Potassium: 3.8 mmol/L (ref 3.5–5.1)
Sodium: 140 mmol/L (ref 135–145)
Total Bilirubin: 0.3 mg/dL (ref 0.3–1.2)
Total Protein: 7 g/dL (ref 6.5–8.1)

## 2019-10-21 MED ORDER — SODIUM CHLORIDE 0.9% FLUSH
10.0000 mL | INTRAVENOUS | Status: DC | PRN
  Administered 2019-10-21: 10 mL
  Filled 2019-10-21: qty 10

## 2019-10-21 MED ORDER — SODIUM CHLORIDE 0.9 % IV SOLN
Freq: Once | INTRAVENOUS | Status: AC
  Filled 2019-10-21: qty 250

## 2019-10-21 MED ORDER — FAMOTIDINE IN NACL 20-0.9 MG/50ML-% IV SOLN
20.0000 mg | Freq: Once | INTRAVENOUS | Status: AC
  Administered 2019-10-21: 20 mg via INTRAVENOUS

## 2019-10-21 MED ORDER — FAMOTIDINE IN NACL 20-0.9 MG/50ML-% IV SOLN
INTRAVENOUS | Status: AC
  Filled 2019-10-21: qty 50

## 2019-10-21 MED ORDER — SODIUM CHLORIDE 0.9 % IV SOLN
65.0000 mg/m2 | Freq: Once | INTRAVENOUS | Status: AC
  Administered 2019-10-21: 138 mg via INTRAVENOUS
  Filled 2019-10-21: qty 23

## 2019-10-21 MED ORDER — HEPARIN SOD (PORK) LOCK FLUSH 100 UNIT/ML IV SOLN
500.0000 [IU] | Freq: Once | INTRAVENOUS | Status: AC | PRN
  Administered 2019-10-21: 500 [IU]
  Filled 2019-10-21: qty 5

## 2019-10-21 MED ORDER — SODIUM CHLORIDE 0.9 % IV SOLN
20.0000 mg | Freq: Once | INTRAVENOUS | Status: AC
  Administered 2019-10-21: 20 mg via INTRAVENOUS
  Filled 2019-10-21: qty 20

## 2019-10-21 NOTE — Patient Instructions (Signed)
[facility] Discharge Instructions for Patients Receiving Chemotherapy  Today you received the following chemotherapy agents:  Taxol.  To help prevent nausea and vomiting after your treatment, we encourage you to take your nausea medication as directed.   If you develop nausea and vomiting that is not controlled by your nausea medication, call the clinic.   BELOW ARE SYMPTOMS THAT SHOULD BE REPORTED IMMEDIATELY:  *FEVER GREATER THAN 100.5 F  *CHILLS WITH OR WITHOUT FEVER  NAUSEA AND VOMITING THAT IS NOT CONTROLLED WITH YOUR NAUSEA MEDICATION  *UNUSUAL SHORTNESS OF BREATH  *UNUSUAL BRUISING OR BLEEDING  TENDERNESS IN MOUTH AND THROAT WITH OR WITHOUT PRESENCE OF ULCERS  *URINARY PROBLEMS  *BOWEL PROBLEMS  UNUSUAL RASH Items with * indicate a potential emergency and should be followed up as soon as possible.  Feel free to call the clinic should you have any questions or concerns. The clinic phone number is [phone].  Please show the CHEMO ALERT CARD at check-in to the Emergency Department and triage nurse.   

## 2019-10-22 LAB — THYROID PANEL WITH TSH
Free Thyroxine Index: 0.4 — ABNORMAL LOW (ref 1.2–4.9)
T3 Uptake Ratio: 13 % — ABNORMAL LOW (ref 24–39)
T4, Total: 2.9 ug/dL — ABNORMAL LOW (ref 4.5–12.0)
TSH: 60.3 u[IU]/mL — ABNORMAL HIGH (ref 0.450–4.500)

## 2019-10-25 ENCOUNTER — Inpatient Hospital Stay: Payer: 59

## 2019-10-25 ENCOUNTER — Other Ambulatory Visit: Payer: Self-pay

## 2019-10-25 VITALS — BP 123/82 | HR 84 | Resp 17

## 2019-10-25 DIAGNOSIS — Z5112 Encounter for antineoplastic immunotherapy: Secondary | ICD-10-CM | POA: Diagnosis not present

## 2019-10-25 DIAGNOSIS — Z95828 Presence of other vascular implants and grafts: Secondary | ICD-10-CM

## 2019-10-25 MED ORDER — FILGRASTIM-SNDZ 480 MCG/0.8ML IJ SOSY
PREFILLED_SYRINGE | INTRAMUSCULAR | Status: AC
  Filled 2019-10-25: qty 0.8

## 2019-10-25 MED ORDER — FILGRASTIM-SNDZ 480 MCG/0.8ML IJ SOSY
480.0000 ug | PREFILLED_SYRINGE | Freq: Once | INTRAMUSCULAR | Status: AC
  Administered 2019-10-25: 480 ug via SUBCUTANEOUS

## 2019-10-25 NOTE — Patient Instructions (Signed)

## 2019-10-26 NOTE — Progress Notes (Signed)
Patient Care Team: Patient, No Pcp Per as PCP - General (General Practice) [name], RN as Oncology Nurse Navigator [name], RN as Oncology Nurse Navigator [name], MD as Consulting Physician (General Surgery) [name], MD as Consulting Physician (Hematology and Oncology) [name], MD as Consulting Physician (Radiation Oncology)  DIAGNOSIS:    ICD-10-CM   1. Malignant neoplasm of overlapping sites of left breast in female, estrogen receptor negative ([place])  C50.812    Z17.1     SUMMARY OF ONCOLOGIC HISTORY: Oncology History  Malignant neoplasm of overlapping sites of left breast in female, estrogen receptor negative ([place])  04/25/2019 Initial Diagnosis   Patient palpated a left breast and axillary mass x7month. [place] the left breast showed a 3.6cm mass at the 1 o'clock position with 6 smaller adjacent masses extending toward the nipple ranging in size from 1.1cm to 4.0cm. [place] the left axilla showed five bulky lymph nodes, the largest measuring 4.2cm, and second largest measuring 2.8cm. 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    04/27/2019 Cancer Staging   Staging form: Breast, AJCC 8th Edition - Clinical: Stage IIIC (cT1c, cN2a, cM0, G3, ER-, PR-, HER2-) - Signed by GNicholas Lose MD on 04/27/2019   05/12/2019 -  Chemotherapy   The patient had DOXOrubicin (ADRIAMYCIN) chemo injection 128 mg, 60 mg/m2 = 128 mg, Intravenous,  Once, 4 of 4 cycles Administration: 128 mg (05/12/2019), 128 mg (06/03/2019), 128 mg (06/23/2019), 128 mg (07/14/2019) palonosetron (ALOXI) injection 0.25 mg, 0.25 mg, Intravenous,  Once, 8 of 8 cycles Administration: 0.25 mg (05/12/2019), 0.25 mg (08/04/2019), 0.25 mg (06/03/2019), 0.25 mg (09/01/2019), 0.25 mg (06/23/2019), 0.25 mg (07/14/2019), 0.25 mg (09/22/2019), 0.25 mg (10/13/2019) pegfilgrastim-cbqv (UDENYCA) injection 6 mg, 6 mg, Subcutaneous, Once, 4 of 4 cycles Administration: 6 mg  (05/14/2019), 6 mg (06/06/2019), 6 mg (06/25/2019), 6 mg (07/16/2019) CARBOplatin (PARAPLATIN) 700 mg in sodium chloride 0.9 % 250 mL chemo infusion, 700 mg (100 % of original dose 700 mg), Intravenous,  Once, 4 of 4 cycles Dose modification: 700 mg (original dose 700 mg, Cycle 5) Administration: 700 mg (08/04/2019), 700 mg (09/01/2019), 700 mg (09/22/2019), 700 mg (10/13/2019) cyclophosphamide (CYTOXAN) 1,280 mg in sodium chloride 0.9 % 250 mL chemo infusion, 600 mg/m2 = 1,280 mg, Intravenous,  Once, 4 of 4 cycles Administration: 1,280 mg (05/12/2019), 1,280 mg (06/03/2019), 1,280 mg (06/23/2019), 1,280 mg (07/14/2019) PACLitaxel (TAXOL) 174 mg in sodium chloride 0.9 % 250 mL chemo infusion (</= 837mm2), 80 mg/m2 = 174 mg, Intravenous,  Once, 4 of 4 cycles Dose modification: 65 mg/m2 (original dose 80 mg/m2, Cycle 6, Reason: Dose not tolerated) Administration: 174 mg (08/04/2019), 174 mg (08/11/2019), 138 mg (09/01/2019), 138 mg (08/25/2019), 138 mg (09/08/2019), 138 mg (09/15/2019), 138 mg (09/22/2019), 138 mg (09/29/2019), 138 mg (10/07/2019), 138 mg (10/13/2019), 138 mg (10/21/2019) fosaprepitant (EMEND) 150 mg in sodium chloride 0.9 % 145 mL IVPB, 150 mg, Intravenous,  Once, 8 of 8 cycles Administration: 150 mg (05/12/2019), 150 mg (08/04/2019), 150 mg (06/03/2019), 150 mg (09/01/2019), 150 mg (06/23/2019), 150 mg (07/14/2019), 150 mg (09/22/2019), 150 mg (10/13/2019) pembrolizumab (KEYTRUDA) 200 mg in sodium chloride 0.9 % 50 mL chemo infusion, 2 mg/kg = 200 mg, Intravenous, Once, 8 of 8 cycles Administration: 200 mg (05/12/2019), 200 mg (06/03/2019), 200 mg (06/23/2019), 200 mg (07/14/2019), 200 mg (08/04/2019), 200 mg (09/01/2019), 200 mg (09/22/2019), 200 mg (10/13/2019)  for chemotherapy treatment.     Genetic  Testing   Negative genetic testing. No pathogenic variants identified. VUS in HOXB13 called c.473C>A, VUS in POLD1 called c.1610G>C, and VUS in RAD50 called c.1094G>A identified on the [facility] Common Hereditary Cancers Panel. The  report date is 05/10/2019.  The Common Hereditary Cancers Panel offered by [facility] includes sequencing and/or deletion duplication testing of the following 48 genes: APC, ATM, AXIN2, BARD1, BMPR1A, BRCA1, BRCA2, BRIP1, CDH1, CDKN2A (p14ARF), CDKN2A (p16INK4a), CKD4, CHEK2, CTNNA1, DICER1, EPCAM (Deletion/duplication testing only), GREM1 (promoter region deletion/duplication testing only), KIT, MEN1, MLH1, MSH2, MSH3, MSH6, MUTYH, NBN, NF1, NHTL1, PALB2, PDGFRA, PMS2, POLD1, POLE, PTEN, RAD50, RAD51C, RAD51D, RNF43, SDHB, SDHC, SDHD, SMAD4, SMARCA4. STK11, TP53, TSC1, TSC2, and VHL.  The following genes were evaluated for sequence changes only: SDHA and HOXB13 c.251G>A variant only.     CHIEF COMPLIANT: Cycle12Taxol  INTERVAL HISTORY: Heather Mckenzie is a 39 y.o. with above-mentioned history of triple negative left breast cancer currently on neoadjuvant chemotherapy withweekly Taxol and Carboplatinand pembrolizumabevery 3weeks.She presents to the clinic todayfor cycle12.  ALLERGIES:  has No Known Allergies.  MEDICATIONS:  Current Outpatient Medications  Medication Sig Dispense Refill  . fluconazole (DIFLUCAN) 100 MG tablet Take 1 tablet (100 mg total) by mouth daily. 7 tablet 0  . ibuprofen (ADVIL,MOTRIN) 600 MG tablet Take 1 tablet (600 mg total) by mouth every 6 (six) hours as needed for pain. 30 tablet 2  . levonorgestrel-ethinyl estradiol (AMETHYST) 90-20 MCG tablet Take 1 tablet by mouth daily. (28) 90 mcg-20 mcg tablet    . levothyroxine (SYNTHROID) 50 MCG tablet Take 50 mcg by mouth daily.    Marland Kitchen levothyroxine (SYNTHROID) 88 MCG tablet Take 1 tablet (88 mcg total) by mouth daily before breakfast.    . lidocaine-prilocaine (EMLA) cream Apply to affected area once 30 g 3  . LORazepam (ATIVAN) 0.5 MG tablet Take 1 tablet (0.5 mg total) by mouth at bedtime as needed for sleep. 30 tablet 0  . ondansetron (ZOFRAN) 8 MG tablet Take 1 tablet (8 mg total) by mouth 2 (two) times daily as  needed. Start on the third day after chemotherapy. 30 tablet 1  . prochlorperazine (COMPAZINE) 10 MG tablet Take 1 tablet (10 mg total) by mouth every 6 (six) hours as needed (Nausea or vomiting). 30 tablet 1   No current facility-administered medications for this visit.    PHYSICAL EXAMINATION: ECOG PERFORMANCE STATUS: 1 - Symptomatic but completely ambulatory  Vitals:   10/27/19 1110  BP: 110/77  Pulse: 91  Resp: 18  Temp: 98.2 F (36.8 C)  SpO2: 100%   Filed Weights   10/27/19 1110  Weight: 216 lb 3.2 oz (98.1 kg)    LABORATORY DATA:  I have reviewed the data as listed CMP Latest Ref Rng & Units 10/21/2019 10/13/2019 10/07/2019  Glucose 70 - 99 mg/dL 85 91 91  BUN 6 - 20 mg/dL '16 13 12  ' Creatinine 0.44 - 1.00 mg/dL 0.78 0.79 0.85  Sodium 135 - 145 mmol/L 140 140 140  Potassium 3.5 - 5.1 mmol/L 3.8 4.0 3.9  Chloride 98 - 111 mmol/L 103 105 106  CO2 22 - 32 mmol/L '25 24 22  ' Calcium 8.9 - 10.3 mg/dL 9.0 9.1 9.1  Total Protein 6.5 - 8.1 g/dL 7.0 7.1 7.0  Total Bilirubin 0.3 - 1.2 mg/dL 0.3 0.3 0.2(L)  Alkaline Phos 38 - 126 U/L 85 90 76  AST 15 - 41 U/L '20 16 22  ' ALT 0 - 44 U/L '18 17 19    ' Lab Results  Component Value Date   WBC 4.1 10/21/2019   HGB 9.9 (L) 10/21/2019   HCT 29.8 (L) 10/21/2019   MCV 99.0 10/21/2019   PLT 224 10/21/2019   NEUTROABS 2.1 10/21/2019    ASSESSMENT & PLAN:  Malignant neoplasm of overlapping sites of left breast in female, estrogen receptor negative ([place]) 04/25/2019:Patient palpated a left breast and axillary mass x35month. [place] the left breast showed a 3.6cm mass at the 1 o'clock position with 6 smaller adjacent masses extending toward the nipple ranging in size from 1.1cm to 4.0cm. [place] the left axilla showed five bulky lymph nodes, the largest measuring 4.2cm, and second largest measuring 2.8cm. 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 T1c N2 stage IIIc Based on PET CT scan showing bilateral  cervical nodes, it is stage IV(however her goal is to cure given the oligometastatic nature of her cancer.)  PET CT scan 20/93/2671 Hypermetabolic nodules in the left breast and left axilla and subpectoral adenopathy. Small hypermetabolic lymph nodes in the neck. Marrow activity from G-CSF, thyroid activity possibly from immunotherapy.  MRI of the liver and LN: decided to observe after much   Treatment plan: 1. Neoadjuvant chemotherapy with Adriamycin and Cytoxanevery 3 weeks4 followed byTaxolweekly 12with carboplatin every 3 weeks and based on keynote 522 clinical trial we will add pembrolizumab every 3 weeks(8 cycles every 3 weeks) After she finishes neoadjuvant chemoappearing sternal lesions.  pembrolizumab maintenance every 3 weeks for 9 more cycles 2. Followed bymastectomy andaxillary lymph nodedissection 3. Followed by adjuvant radiation therapy ----------------------------------------------------------------------------------------------------------------------------------------------------- Current treatment:Completed 4 cycles ofAdriamycin and Cytoxan with pembrolizumab,today is cycle12Taxol (with carboplatin and pembrolizumabevery 3 weeks).   Chemo toxicities: 1.Nausea: Mild 2.Oral thrush: Resolved 3.Immune mediated adverse effect: Hypothyroidism:Follows with endocrinology. 4.Neutropenia: Resume Granix and today her white count is normal.  Patient has been set up for breast MRI on 10/31/2019. She will undergo surgery. Follow-up after surgery to resume pembrolizumab immunotherapy every 3 weeks for 1 year.    No orders of the defined types were placed in this encounter.  The patient has a good understanding of the overall plan. she agrees with it. she will call with any problems that may develop before the next visit here.  Total time spent: 30 mins including face to face time and time spent for planning, charting and coordination of  care  GNicholas Lose MD 10/27/2019  I, MCloyde ReamsDorshimer, am acting as scribe for Dr. VNicholas Lose  I have reviewed the above documentation for accuracy and completeness, and I agree with the above.

## 2019-10-27 ENCOUNTER — Inpatient Hospital Stay (HOSPITAL_BASED_OUTPATIENT_CLINIC_OR_DEPARTMENT_OTHER): Payer: 59 | Admitting: Hematology and Oncology

## 2019-10-27 ENCOUNTER — Inpatient Hospital Stay: Payer: 59

## 2019-10-27 ENCOUNTER — Other Ambulatory Visit: Payer: Self-pay

## 2019-10-27 ENCOUNTER — Encounter: Payer: Self-pay | Admitting: *Deleted

## 2019-10-27 DIAGNOSIS — E039 Hypothyroidism, unspecified: Secondary | ICD-10-CM

## 2019-10-27 DIAGNOSIS — Z5112 Encounter for antineoplastic immunotherapy: Secondary | ICD-10-CM | POA: Diagnosis not present

## 2019-10-27 DIAGNOSIS — Z171 Estrogen receptor negative status [ER-]: Secondary | ICD-10-CM

## 2019-10-27 DIAGNOSIS — C50812 Malignant neoplasm of overlapping sites of left female breast: Secondary | ICD-10-CM

## 2019-10-27 LAB — CMP (CANCER CENTER ONLY)
ALT: 18 U/L (ref 0–44)
AST: 20 U/L (ref 15–41)
Albumin: 3.8 g/dL (ref 3.5–5.0)
Alkaline Phosphatase: 86 U/L (ref 38–126)
Anion gap: 10 (ref 5–15)
BUN: 15 mg/dL (ref 6–20)
CO2: 25 mmol/L (ref 22–32)
Calcium: 9.4 mg/dL (ref 8.9–10.3)
Chloride: 104 mmol/L (ref 98–111)
Creatinine: 0.78 mg/dL (ref 0.44–1.00)
GFR, Est AFR Am: >60 mL/min (ref >60)
GFR, Estimated: >60 mL/min (ref >60)
Glucose, Bld: 85 mg/dL (ref 70–99)
Potassium: 3.8 mmol/L (ref 3.5–5.1)
Sodium: 139 mmol/L (ref 135–145)
Total Bilirubin: 0.2 mg/dL — ABNORMAL LOW (ref 0.3–1.2)
Total Protein: 7.2 g/dL (ref 6.5–8.1)

## 2019-10-27 LAB — CBC WITH DIFFERENTIAL (CANCER CENTER ONLY)
Abs Immature Granulocytes: 0.82 10*3/uL — ABNORMAL HIGH (ref 0.00–0.07)
Basophils Absolute: 0 10*3/uL (ref 0.0–0.1)
Basophils Relative: 0 %
Eosinophils Absolute: 0.1 10*3/uL (ref 0.0–0.5)
Eosinophils Relative: 1 %
HCT: 30.9 % — ABNORMAL LOW (ref 36.0–46.0)
Hemoglobin: 10.4 g/dL — ABNORMAL LOW (ref 12.0–15.0)
Immature Granulocytes: 9 %
Lymphocytes Relative: 15 %
Lymphs Abs: 1.4 10*3/uL (ref 0.7–4.0)
MCH: 33.5 pg (ref 26.0–34.0)
MCHC: 33.7 g/dL (ref 30.0–36.0)
MCV: 99.7 fL (ref 80.0–100.0)
Monocytes Absolute: 0.4 10*3/uL (ref 0.1–1.0)
Monocytes Relative: 4 %
Neutro Abs: 6.5 10*3/uL (ref 1.7–7.7)
Neutrophils Relative %: 71 %
Platelet Count: 300 10*3/uL (ref 150–400)
RBC: 3.1 MIL/uL — ABNORMAL LOW (ref 3.87–5.11)
RDW: 15.9 % — ABNORMAL HIGH (ref 11.5–15.5)
WBC Count: 9.1 10*3/uL (ref 4.0–10.5)
nRBC: 0 % (ref 0.0–0.2)

## 2019-10-27 LAB — T4, FREE: Free T4: <0.25 ng/dL — ABNORMAL LOW (ref 0.61–1.12)

## 2019-10-27 MED ORDER — SODIUM CHLORIDE 0.9 % IV SOLN
65.0000 mg/m2 | Freq: Once | INTRAVENOUS | Status: AC
  Administered 2019-10-27: 138 mg via INTRAVENOUS
  Filled 2019-10-27: qty 23

## 2019-10-27 MED ORDER — HEPARIN SOD (PORK) LOCK FLUSH 100 UNIT/ML IV SOLN
500.0000 [IU] | Freq: Once | INTRAVENOUS | Status: AC | PRN
  Administered 2019-10-27: 500 [IU]
  Filled 2019-10-27: qty 5

## 2019-10-27 MED ORDER — FAMOTIDINE IN NACL 20-0.9 MG/50ML-% IV SOLN
20.0000 mg | Freq: Once | INTRAVENOUS | Status: AC
  Administered 2019-10-27: 20 mg via INTRAVENOUS

## 2019-10-27 MED ORDER — SODIUM CHLORIDE 0.9 % IV SOLN
20.0000 mg | Freq: Once | INTRAVENOUS | Status: AC
  Administered 2019-10-27: 20 mg via INTRAVENOUS
  Filled 2019-10-27: qty 20

## 2019-10-27 MED ORDER — SODIUM CHLORIDE 0.9 % IV SOLN
Freq: Once | INTRAVENOUS | Status: AC
  Filled 2019-10-27: qty 250

## 2019-10-27 MED ORDER — SODIUM CHLORIDE 0.9% FLUSH
10.0000 mL | INTRAVENOUS | Status: DC | PRN
  Administered 2019-10-27: 10 mL
  Filled 2019-10-27: qty 10

## 2019-10-27 MED ORDER — FAMOTIDINE IN NACL 20-0.9 MG/50ML-% IV SOLN
INTRAVENOUS | Status: AC
  Filled 2019-10-27: qty 50

## 2019-10-27 NOTE — Assessment & Plan Note (Signed)
04/25/2019:Patient palpated a left breast and axillary mass x40month. [place] the left breast showed a 3.6cm mass at the 1 o'clock position with 6 smaller adjacent masses extending toward the nipple ranging in size from 1.1cm to 4.0cm. [place] the left axilla showed five bulky lymph nodes, the largest measuring 4.2cm, and second largest measuring 2.8cm. 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 T1c N2 stage IIIc Based on PET CT scan showing bilateral cervical nodes, it is stage IV(however her goal is to cure given the oligometastatic nature of her cancer.)  PET CT scan 22/50/5397 Hypermetabolic nodules in the left breast and left axilla and subpectoral adenopathy. Small hypermetabolic lymph nodes in the neck. Marrow activity from G-CSF, thyroid activity possibly from immunotherapy.  MRI of the liver and LN: decided to observe after much   Treatment plan: 1. Neoadjuvant chemotherapy with Adriamycin and Cytoxanevery 3 weeks4 followed byTaxolweekly 12with carboplatin every 3 weeks and based on keynote 522 clinical trial we will add pembrolizumab every 3 weeks(8 cycles every 3 weeks) After she finishes neoadjuvant chemoappearing sternal lesions.  pembrolizumab maintenance every 3 weeks for 9 more cycles 2. Followed bymastectomy andaxillary lymph nodedissection 3. Followed by adjuvant radiation therapy ----------------------------------------------------------------------------------------------------------------------------------------------------- Current treatment:Completed 4 cycles ofAdriamycin and Cytoxan with pembrolizumab,today is cycle12Taxol (with carboplatin and pembrolizumabevery 3 weeks).   Chemo toxicities: 1.Nausea: Mild 2.Oral thrush: Resolved 3.Immune mediated adverse effect: Hypothyroidism:Follows with endocrinology. 4.Neutropenia: Resume Granix and today her white count is normal.  Patient has been set  up for breast MRI on 10/31/2019. She will undergo surgery. Follow-up after surgery to resume pembrolizumab immunotherapy every 3 weeks for 1 year.

## 2019-10-27 NOTE — Patient Instructions (Signed)
[facility] Discharge Instructions for Patients Receiving Chemotherapy  Today you received the following chemotherapy agents: paclitaxel.  To help prevent nausea and vomiting after your treatment, we encourage you to take your nausea medication as directed.   If you develop nausea and vomiting that is not controlled by your nausea medication, call the clinic.   BELOW ARE SYMPTOMS THAT SHOULD BE REPORTED IMMEDIATELY:  *FEVER GREATER THAN 100.5 F  *CHILLS WITH OR WITHOUT FEVER  NAUSEA AND VOMITING THAT IS NOT CONTROLLED WITH YOUR NAUSEA MEDICATION  *UNUSUAL SHORTNESS OF BREATH  *UNUSUAL BRUISING OR BLEEDING  TENDERNESS IN MOUTH AND THROAT WITH OR WITHOUT PRESENCE OF ULCERS  *URINARY PROBLEMS  *BOWEL PROBLEMS  UNUSUAL RASH Items with * indicate a potential emergency and should be followed up as soon as possible.  Feel free to call the clinic should you have any questions or concerns. The clinic phone number is [phone].  Please show the CHEMO ALERT CARD at check-in to the Emergency Department and triage nurse.   

## 2019-10-28 ENCOUNTER — Telehealth: Payer: Self-pay | Admitting: Hematology and Oncology

## 2019-10-28 LAB — THYROID PANEL WITH TSH
Free Thyroxine Index: 0.4 — ABNORMAL LOW (ref 1.2–4.9)
T3 Uptake Ratio: 13 % — ABNORMAL LOW (ref 24–39)
T4, Total: 3 ug/dL — ABNORMAL LOW (ref 4.5–12.0)
TSH: 54.4 u[IU]/mL — ABNORMAL HIGH (ref 0.450–4.500)

## 2019-10-28 LAB — T3: T3, Total: 66 ng/dL — ABNORMAL LOW (ref 71–180)

## 2019-10-28 NOTE — Telephone Encounter (Signed)
No 7/15 los, no changes made to pt schedule  

## 2019-10-31 ENCOUNTER — Ambulatory Visit
Admission: RE | Admit: 2019-10-31 | Discharge: 2019-10-31 | Disposition: A | Discharge status: Home / Self Care | Payer: 59 | Source: Ambulatory Visit | Attending: Hematology and Oncology | Admitting: Hematology and Oncology

## 2019-10-31 ENCOUNTER — Encounter: Payer: Self-pay | Admitting: *Deleted

## 2019-10-31 DIAGNOSIS — Z171 Estrogen receptor negative status [ER-]: Secondary | ICD-10-CM

## 2019-10-31 MED ORDER — GADOBUTROL 1 MMOL/ML IV SOLN
10.0000 mL | Freq: Once | INTRAVENOUS | Status: AC | PRN
  Administered 2019-10-31: 10 mL via INTRAVENOUS

## 2019-11-07 ENCOUNTER — Ambulatory Visit: Payer: Self-pay | Admitting: Surgery

## 2019-11-07 ENCOUNTER — Encounter: Payer: Self-pay | Admitting: *Deleted

## 2019-11-07 NOTE — H&P (Signed)
Heather Mckenzie Appointment: 11/07/2019 2:40 PM Location: [facility] Patient #: [id number] DOB: 1980/11/06 Undefined / Language: Heather Mckenzie / Race: White Female  History of Present Illness Heather Moores A. Vashawn Ekstein MD; 11/07/2019 6:10 PM) Patient words: Patient returns for follow-up after neoadjuvant chemotherapy for locally advanced left breast cancer. She's had a good response to chemotherapy from her magnetic resonance imaging and is ready to schedule definitive surgery. We discussed surgical options to include left modified radical mastectomy and possible risk reducing right mastectomy given her young age. She is not a candidate for breast conserving surgery due to her multifocal disease and significant left axillary adenopathy. Most of this is resolved. She still a significant uptake and image change on the left breast.                        CLINICAL DATA: Neoadjuvant treatment for breast cancer. 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 Patient has completed chemotherapy. Restaging.  LABS: None obtained at the time of imaging.  EXAM: BILATERAL BREAST MRI WITH AND WITHOUT CONTRAST  TECHNIQUE: Multiplanar, multisequence MR images of both breasts were obtained prior to and following the intravenous administration of 10 ml of Gadavist  Three-dimensional MR images were rendered by post-processing of the original MR data on an independent workstation. The three-dimensional MR images were interpreted, and findings are reported in the following complete MRI report for this study. Three dimensional images were evaluated at the independent DynaCad workstation  COMPARISON: MRI on 05/13/2019, LEFT breast ultrasound on 08/02/2019  FINDINGS: Breast composition: b. Scattered fibroglandular tissue.  Background parenchymal  enhancement: Moderate.  Right breast: No mass or abnormal enhancement. RIGHT-sided Port-A-Cath.  Left breast: There has been significant improvement in the appearance of numerous enhancing masses in the LEFT breast.  The lesion in the 10 o'clock location of the LEFT breast is marked with a tissue marker clip. The nonenhancing masslike component of this lesion now measures 2.2 x 1.9 centimeters and previously measured 4.4 x 2.6 centimeters. There is significantly less enhancement of this lesion and the UPPER-OUTER QUADRANT of the LEFT breast, now measuring 4.6 x 5.9 centimeters. Previously, abnormal enhancement in LEFT breast spanned 10.4 x 12.7 x 11.4 centimeters.  Tissue marker clip is identified in the UPPER INNER anterior portion of the LEFT breast, at the site of malignancy in the 10 o'clock location. There has been complete resolution of the mass and associated enhancement at this clip.  The enhancement associated with innumerable masses in the UPPER and LOWER OUTER quadrants of the LEFT breast has completely resolved. Nonenhancing masses are scattered throughout the UPPER and LOWER quadrants of the LEFT breast and appear less numerous and smaller, consistent with treatment effect.  Lymph nodes: Tissue marker clip is identified in the LEFT axilla. Lymph nodes in the axillary regions appear morphologically normal, a significant improvement.  Ancillary findings: None.  IMPRESSION: 1. Significantly improved appearance of the LEFT breast. 2. Minimal non mass enhancement in the UPPER-OUTER QUADRANT of the LEFT breast spans 5.9 centimeters in maximum dimension, previously 12.7 centimeters. 3. Numerous spiculated masses in the UPPER and LOWER OUTER quadrants of the LEFT breast show no residual enhancement although some small spiculated nonenhancing masses persist. 4. Complete resolution of mass and enhancement in the UPPER INNER QUADRANT of the LEFT breast at the site of  malignancy in the  10 o'clock location. 5. Complete resolution of LEFT axillary adenopathy.  RECOMMENDATION: Treatment plan for known LEFT breast malignancy.  BI-RADS CATEGORY 6: Known biopsy-proven malignancy.   Electronically Signed By: Heather Mckenzie M.D. On: 10/31/2019 12:06.  The patient is a 39 year old female.   Allergies Heather Mckenzie, [place]; 11/07/2019 2:57 PM) No Known Allergies [04/26/2019]: Allergies Reconciled  Medication History Heather Mckenzie, CMA; 11/07/2019 2:57 PM) Levothyroxine Sodium (50MCG Tablet, Oral) Active. Medications Reconciled    Vitals Heather Mckenzie CMA; 11/07/2019 2:57 PM) 11/07/2019 2:56 PM Weight: 218.6 lb Height: 63in Body Surface Area: 2.01 m Body Mass Index: 38.72 kg/m  Temp.: 97.68F  Pulse: 101 (Regular)  BP: 124/68(Sitting, Left Arm, Standard)        Physical Exam (Heather Lurz A. Kdyn Vonbehren MD; 11/07/2019 6:11 PM)  General Mental Status-Alert. General Appearance-Consistent with stated age. Hydration-Well hydrated. Voice-Normal.  Head and Neck Note: Alopecia  Breast Breast - Left-Symmetric, Non Tender, No Biopsy scars, no Dimpling - Left, No Inflammation, No Lumpectomy scars, No Mastectomy scars, No Peau d' Orange. Breast - Right-Symmetric, Non Tender, No Biopsy scars, no Dimpling - Right, No Inflammation, No Lumpectomy scars, No Mastectomy scars, No Peau d' Orange. Breast Lump-No Palpable Breast Mass.  Neurologic Neurologic evaluation reveals -alert and oriented x 3 with no impairment of recent or remote memory. Mental Status-Normal.  Lymphatic Axillary  General Axillary Region: Bilateral - Description - Normal. Tenderness - Non Tender.    Assessment & Plan (Heather Pressnell A. Nil Bolser MD; 11/07/2019 6:11 PM)  BREAST CANCER, LEFT (C50.912) Impression: good response to chemo keep port for now needs MRM on left but desires risk reduction mastectomy on the right Discussed the pros and  cons of risk redo she was tacked him in her age and discussed potential lifetime risk of breast cancer as well. refer to plastics Discussed treatment options for breast cancer to include breast conservation vs mastectomy with reconstruction. Pt has decided on mastectomy. Risk include bleeding, infection, flap necrosis, pain, numbness, recurrence, hematoma, other surgery needs. Pt understands and agrees to proceed.    45 min total time  Current Plans Pt Education - CCS Mastectomy HCI Pt Education - ABC (After Breast Cancer) Class Info: discussed with patient and provided information.  AT HIGH RISK FOR BREAST CANCER (Z91.89) Impression: discussed risk reducing right mastectomy with reconstruction

## 2019-11-10 ENCOUNTER — Other Ambulatory Visit: Payer: Self-pay | Admitting: Hematology and Oncology

## 2019-11-10 DIAGNOSIS — Z171 Estrogen receptor negative status [ER-]: Secondary | ICD-10-CM

## 2019-11-10 DIAGNOSIS — E039 Hypothyroidism, unspecified: Secondary | ICD-10-CM

## 2019-11-17 ENCOUNTER — Ambulatory Visit (INDEPENDENT_AMBULATORY_CARE_PROVIDER_SITE_OTHER): Payer: 59 | Admitting: Plastic Surgery

## 2019-11-17 ENCOUNTER — Other Ambulatory Visit: Payer: Self-pay

## 2019-11-17 ENCOUNTER — Encounter: Payer: Self-pay | Admitting: Plastic Surgery

## 2019-11-17 VITALS — BP 132/84 | HR 90 | Temp 98.5°F | Ht 63.0 in | Wt 215.4 lb

## 2019-11-17 DIAGNOSIS — Z171 Estrogen receptor negative status [ER-]: Secondary | ICD-10-CM | POA: Diagnosis not present

## 2019-11-17 DIAGNOSIS — C50812 Malignant neoplasm of overlapping sites of left female breast: Secondary | ICD-10-CM
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
## 2019-11-24 ENCOUNTER — Telehealth: Payer: Self-pay | Admitting: Hematology and Oncology

## 2019-11-24 ENCOUNTER — Encounter: Payer: 59 | Admitting: Surgical

## 2019-11-24 ENCOUNTER — Encounter: Payer: Self-pay | Admitting: *Deleted

## 2019-11-24 NOTE — Telephone Encounter (Signed)
Scheduled appointment per 8/12 scheduling message. Left message for patient with appointment date and time.

## 2019-11-28 ENCOUNTER — Other Ambulatory Visit: Payer: Self-pay

## 2019-11-28 ENCOUNTER — Ambulatory Visit: Payer: 59 | Attending: Surgery | Admitting: Physical Therapy

## 2019-11-28 DIAGNOSIS — R293 Abnormal posture: Secondary | ICD-10-CM | POA: Insufficient documentation

## 2019-11-28 DIAGNOSIS — C50812 Malignant neoplasm of overlapping sites of left female breast: Secondary | ICD-10-CM | POA: Insufficient documentation

## 2019-11-28 DIAGNOSIS — Z171 Estrogen receptor negative status [ER-]: Secondary | ICD-10-CM | POA: Insufficient documentation
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
## 2019-11-29 ENCOUNTER — Telehealth: Payer: Self-pay | Admitting: Plastic Surgery

## 2019-11-29 NOTE — Telephone Encounter (Signed)
Called patient on 8/12 due to no-show for pre-operative appointment. Left a message for patient to return my call. Called again on 8/17 for patient to return my call. I have rescheduled the pre-op for 9/9 and will mail her an updated surgery packet. The front office staff is aware to relay this information if she calls back to the main line. If patient calls back, please ensure that the appointment is communicated and make sure we have all contact numbers up to date for the patient.

## 2019-11-30 ENCOUNTER — Encounter: Payer: Self-pay | Admitting: *Deleted

## 2019-12-02 ENCOUNTER — Other Ambulatory Visit: Payer: Self-pay | Admitting: Adult Health

## 2019-12-02 ENCOUNTER — Other Ambulatory Visit (HOSPITAL_COMMUNITY): Payer: 59

## 2019-12-14 ENCOUNTER — Encounter: Payer: 59 | Admitting: Plastic Surgery

## 2019-12-21 ENCOUNTER — Institutional Professional Consult (permissible substitution): Payer: 59 | Admitting: Plastic Surgery

## 2019-12-21 ENCOUNTER — Encounter: Payer: 59 | Admitting: Surgical

## 2019-12-22 ENCOUNTER — Other Ambulatory Visit: Payer: Self-pay

## 2019-12-22 ENCOUNTER — Encounter: Payer: Self-pay | Admitting: Surgical

## 2019-12-22 ENCOUNTER — Ambulatory Visit (INDEPENDENT_AMBULATORY_CARE_PROVIDER_SITE_OTHER): Payer: 59 | Admitting: Surgical

## 2019-12-22 VITALS — BP 100/67 | HR 84 | Temp 98.3°F | Ht 63.0 in | Wt 218.4 lb

## 2019-12-22 DIAGNOSIS — Z171 Estrogen receptor negative status [ER-]: Secondary | ICD-10-CM

## 2019-12-22 DIAGNOSIS — C50812 Malignant neoplasm of overlapping sites of left female breast: Secondary | ICD-10-CM
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
## 2019-12-23 MED ORDER — HYDROCODONE-ACETAMINOPHEN 5-325 MG PO TABS: 1.0000 | ORAL_TABLET | Freq: Four times a day (QID) | ORAL | 0 refills | Status: AC | PRN

## 2019-12-23 MED ORDER — SULFAMETHOXAZOLE-TRIMETHOPRIM 800-160 MG PO TABS
1.0000 | ORAL_TABLET | Freq: Two times a day (BID) | ORAL | 0 refills | Status: DC
Start: 2019-12-23 — End: 2019-12-27

## 2019-12-23 MED ORDER — ONDANSETRON 4 MG PO TBDP: 4.0000 mg | ORAL_TABLET | Freq: Three times a day (TID) | ORAL | 0 refills | Status: DC | PRN

## 2019-12-23 NOTE — Addendum Note (Signed)
Addended byRoetta Sessions on: 12/23/2019 08:59 AM   Modules accepted: Orders

## 2020-01-02 ENCOUNTER — Ambulatory Visit: Payer: 59 | Admitting: Physical Therapy

## 2020-01-03 NOTE — Progress Notes (Signed)
[id number] - [place], [address] [place] [address] Phone: [phone] Fax: [phone]      Your procedure is scheduled on September 28  Report to [facility] Main Entrance "A" at 0730 A.M., and check in at the Admitting office.  Call this number if you have problems the morning of surgery:  [phone]  Call [phone] if you have any questions prior to your surgery date Monday-Friday 8am-4pm    Remember:  Do not eat after midnight the night before your surgery  You may drink clear liquids until 0630 am the morning of your surgery.   Clear liquids allowed are: Water, Non-Citrus Juices (without pulp), Carbonated Beverages, Clear Tea, Black Coffee Only, and Gatorade    Take these medicines the morning of surgery with A SIP OF WATER  levonorgestrel-ethinyl estradiol (AMETHYST)  As of today, STOP taking any Aspirin (unless otherwise instructed by your surgeon) Aleve, Naproxen, Ibuprofen, Motrin, Advil, Goody's, BC's, all herbal medications, fish oil, and all vitamins.                      Do not wear jewelry, make up, or nail polish            Do not wear lotions, powders, perfumes/colognes, or deodorant.            Do not shave 48 hours prior to surgery.  Men may shave face and neck.            Do not bring valuables to the hospital.            [facility] is not responsible for any belongings or valuables.  Do NOT Smoke (Tobacco/Vaping) or drink Alcohol 24 hours prior to your procedure If you use a CPAP at night, you may bring all equipment for your overnight stay.   Contacts, glasses, dentures or bridgework may not be worn into surgery.      For patients admitted to the hospital, discharge time will be determined by your treatment team.   Patients discharged the day of surgery will not be allowed to drive home, and someone needs to stay with them for 24 hours.    Special  instructions:   [facility]- Preparing For Surgery  Before surgery, you can play an important role. Because skin is not sterile, your skin needs to be as free of germs as possible. You can reduce the number of germs on your skin by washing with CHG (chlorahexidine gluconate) Soap before surgery.  CHG is an antiseptic cleaner which kills germs and bonds with the skin to continue killing germs even after washing.    Oral Hygiene is also important to reduce your risk of infection.  Remember - BRUSH YOUR TEETH THE MORNING OF SURGERY WITH YOUR REGULAR TOOTHPASTE  Please do not use if you have an allergy to CHG or antibacterial soaps. If your skin becomes reddened/irritated stop using the CHG.  Do not shave (including legs and underarms) for at least 48 hours prior to first CHG shower. It is OK to shave your face.  Please follow these instructions carefully.   1. Shower the NIGHT BEFORE SURGERY and the MORNING OF SURGERY with CHG Soap.   2. If you chose to wash your hair, wash your hair first as usual with your normal shampoo.  3. After you shampoo, rinse your hair and body thoroughly to  remove the shampoo.  4. Use CHG as you would any other liquid soap. You can apply CHG directly to the skin and wash gently with a scrungie or a clean washcloth.   5. Apply the CHG Soap to your body ONLY FROM THE NECK DOWN.  Do not use on open wounds or open sores. Avoid contact with your eyes, ears, mouth and genitals (private parts). Wash Face and genitals (private parts)  with your normal soap.   6. Wash thoroughly, paying special attention to the area where your surgery will be performed.  7. Thoroughly rinse your body with warm water from the neck down.  8. DO NOT shower/wash with your normal soap after using and rinsing off the CHG Soap.  9. Pat yourself dry with a CLEAN TOWEL.  10. Wear CLEAN PAJAMAS to bed the night before surgery  11. Place CLEAN SHEETS on your bed the night of your first shower and  DO NOT SLEEP WITH PETS.   Day of Surgery: Wear Clean/Comfortable clothing the morning of surgery Do not apply any deodorants/lotions.   Remember to brush your teeth WITH YOUR REGULAR TOOTHPASTE.   Please read over the following fact sheets that you were given.

## 2020-01-04 ENCOUNTER — Other Ambulatory Visit: Payer: Self-pay

## 2020-01-04 ENCOUNTER — Encounter (HOSPITAL_COMMUNITY)
Admission: RE | Admit: 2020-01-04 | Discharge: 2020-01-04 | Disposition: A | Discharge status: Home / Self Care | Payer: 59 | Source: Ambulatory Visit | Attending: Surgery | Admitting: Surgery

## 2020-01-04 ENCOUNTER — Encounter (HOSPITAL_COMMUNITY): Payer: Self-pay

## 2020-01-04 DIAGNOSIS — Z01812 Encounter for preprocedural laboratory examination: Secondary | ICD-10-CM | POA: Insufficient documentation

## 2020-01-04 HISTORY — DX: Disorder of thyroid, unspecified: E07.9

## 2020-01-04 LAB — COMPREHENSIVE METABOLIC PANEL
ALT: 17 U/L (ref 0–44)
AST: 24 U/L (ref 15–41)
Albumin: 4 g/dL (ref 3.5–5.0)
Alkaline Phosphatase: 70 U/L (ref 38–126)
Anion gap: 10 (ref 5–15)
BUN: 11 mg/dL (ref 6–20)
CO2: 26 mmol/L (ref 22–32)
Calcium: 9.5 mg/dL (ref 8.9–10.3)
Chloride: 102 mmol/L (ref 98–111)
Creatinine, Ser: 0.85 mg/dL (ref 0.44–1.00)
GFR calc Af Amer: >60 mL/min (ref >60)
GFR calc non Af Amer: >60 mL/min (ref >60)
Glucose, Bld: 96 mg/dL (ref 70–99)
Potassium: 3.7 mmol/L (ref 3.5–5.1)
Sodium: 138 mmol/L (ref 135–145)
Total Bilirubin: 0.7 mg/dL (ref 0.3–1.2)
Total Protein: 7.6 g/dL (ref 6.5–8.1)

## 2020-01-04 LAB — CBC WITH DIFFERENTIAL/PLATELET
Abs Immature Granulocytes: 0.02 10*3/uL (ref 0.00–0.07)
Basophils Absolute: 0 10*3/uL (ref 0.0–0.1)
Basophils Relative: 1 %
Eosinophils Absolute: 0.2 10*3/uL (ref 0.0–0.5)
Eosinophils Relative: 2 %
HCT: 37 % (ref 36.0–46.0)
Hemoglobin: 12.1 g/dL (ref 12.0–15.0)
Immature Granulocytes: 0 %
Lymphocytes Relative: 20 %
Lymphs Abs: 1.7 10*3/uL (ref 0.7–4.0)
MCH: 32.8 pg (ref 26.0–34.0)
MCHC: 32.7 g/dL (ref 30.0–36.0)
MCV: 100.3 fL — ABNORMAL HIGH (ref 80.0–100.0)
Monocytes Absolute: 0.4 10*3/uL (ref 0.1–1.0)
Monocytes Relative: 4 %
Neutro Abs: 6.1 10*3/uL (ref 1.7–7.7)
Neutrophils Relative %: 73 %
Platelets: 349 10*3/uL (ref 150–400)
RBC: 3.69 MIL/uL — ABNORMAL LOW (ref 3.87–5.11)
RDW: 12.2 % (ref 11.5–15.5)
WBC: 8.3 10*3/uL (ref 4.0–10.5)
nRBC: 0 % (ref 0.0–0.2)

## 2020-01-04 NOTE — Progress Notes (Addendum)
PCP - denies Cardiologist -denies  Oncologist - Dr. [name]  PPM/ICD - denies  Chest x-ray - N/A EKG - N/A Stress Test - denies  ECHO - 05/09/2019 Cardiac Cath - denies  Sleep Study - denies CPAP - N/A  DM: denies  Blood Thinner Instructions: N/A Aspirin Instructions: N/A  ERAS Protcol - Yes  PRE-SURGERY Ensure or G2- Ensure given  COVID TEST- Scheduled for 01/06/2020. Patient verbalized understanding of self-quarantine instructions, appointment time and place.  Anesthesia review: YES, chemo 10/2019  Patient denies shortness of breath, fever, cough and chest pain at PAT appointment  All instructions explained to the patient, with a verbal understanding of the material. Patient agrees to go over the instructions while at home for a better understanding. Patient also instructed to self quarantine after being tested for COVID-19. The opportunity to ask questions was provided.

## 2020-01-04 NOTE — Progress Notes (Signed)
Your procedure is scheduled on Tuesday, September 28th  Report to [facility] Main Entrance "A" at 0730 A.M., and check in at the Admitting office.  Call this number if you have problems the morning of surgery:  [phone]  Call [phone] if you have any questions prior to your surgery date Monday-Friday 8am-4pm   Remember:  Do not eat after midnight the night before your surgery  You may drink clear liquids until 0630 am the morning of your surgery.   Clear liquids allowed are: Water, Non-Citrus Juices (without pulp), Carbonated Beverages, Clear Tea, Black Coffee Only, and Gatorade   Please complete your PRE-SURGERY ENSURE that was provided to you by 6:30 A.M. the morning of surgery.  Please, if able, drink it in one setting. DO NOT SIP.   Take these medicines the morning of surgery with A SIP OF WATER  levonorgestrel-ethinyl estradiol (AMETHYST) levothyroxine (SYNTHROID)   As of today, STOP taking any Aspirin (unless otherwise instructed by your surgeon) Aleve, Naproxen, Ibuprofen, Motrin, Advil, Goody's, BC's, all herbal medications, fish oil, and all vitamins.             Do not wear jewelry, make up, or nail polish            Do not wear lotions, powders, perfumes, or deodorant.            Do not shave 48 hours prior to surgery.             Do not bring valuables to the hospital.            [facility] is not responsible for any belongings or valuables.  Do NOT Smoke (Tobacco/Vaping) or drink Alcohol 24 hours prior to your procedure If you use a CPAP at night, you may bring all equipment for your overnight stay.   Contacts, glasses, dentures or bridgework may not be worn into surgery.      For patients admitted to the hospital, discharge time will be determined by your treatment team.   Patients discharged the day of surgery will not be allowed to drive home, and someone needs to stay with them for 24 hours.  Special instructions:   [place]- Preparing For  Surgery  Before surgery, you can play an important role. Because skin is not sterile, your skin needs to be as free of germs as possible. You can reduce the number of germs on your skin by washing with CHG (chlorahexidine gluconate) Soap before surgery.  CHG is an antiseptic cleaner which kills germs and bonds with the skin to continue killing germs even after washing.    Oral Hygiene is also important to reduce your risk of infection.  Remember - BRUSH YOUR TEETH THE MORNING OF SURGERY WITH YOUR REGULAR TOOTHPASTE  Please do not use if you have an allergy to CHG or antibacterial soaps. If your skin becomes reddened/irritated stop using the CHG.  Do not shave (including legs and underarms) for at least 48 hours prior to first CHG shower. It is OK to shave your face.  Please follow these instructions carefully.   1. Shower the NIGHT BEFORE SURGERY and the MORNING OF SURGERY with CHG Soap.   2. If you chose to wash your hair, wash your hair first as usual with your normal shampoo.  3. After you shampoo, rinse your hair and body thoroughly to remove the shampoo.  4. Use CHG as you would any other liquid soap. You can apply CHG directly to the skin  and wash gently with a scrungie or a clean washcloth.   5. Apply the CHG Soap to your body ONLY FROM THE NECK DOWN.  Do not use on open wounds or open sores. Avoid contact with your eyes, ears, mouth and genitals (private parts). Wash Face and genitals (private parts)  with your normal soap.   6. Wash thoroughly, paying special attention to the area where your surgery will be performed.  7. Thoroughly rinse your body with warm water from the neck down.  8. DO NOT shower/wash with your normal soap after using and rinsing off the CHG Soap.  9. Pat yourself dry with a CLEAN TOWEL.  10. Wear CLEAN PAJAMAS to bed the night before surgery  11. Place CLEAN SHEETS on your bed the night of your first shower and DO NOT SLEEP WITH PETS.  Day of  Surgery: Wear Clean/Comfortable clothing the morning of surgery Do not apply any deodorants/lotions.   Remember to brush your teeth WITH YOUR REGULAR TOOTHPASTE.   Please read over the following fact sheets that you were given.

## 2020-01-05 ENCOUNTER — Encounter (HOSPITAL_COMMUNITY): Payer: Self-pay
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
## 2020-01-05 NOTE — Anesthesia Preprocedure Evaluation (Addendum)
Anesthesia Evaluation  Patient identified by MRN, date of birth, ID band Patient awake    Reviewed: Allergy & Precautions, NPO status , Patient's Chart, lab work & pertinent test results  Airway Mallampati: I  TM Distance: >3 FB Neck ROM: Full    Dental   Pulmonary    Pulmonary exam normal        Cardiovascular Normal cardiovascular exam     Neuro/Psych    GI/Hepatic   Endo/Other    Renal/GU      Musculoskeletal   Abdominal   Peds  Hematology   Anesthesia Other Findings   Reproductive/Obstetrics                             Anesthesia Physical Anesthesia Plan  ASA: II  Anesthesia Plan: General   Post-op Pain Management:  Regional for Post-op pain   Induction: Intravenous  PONV Risk Score and Plan: 3 and Ondansetron, Midazolam and Treatment may vary due to age or medical condition  Airway Management Planned: LMA  Additional Equipment:   Intra-op Plan:   Post-operative Plan: Extubation in OR  Informed Consent: I have reviewed the patients History and Physical, chart, labs and discussed the procedure including the risks, benefits and alternatives for the proposed anesthesia with the patient or authorized representative who has indicated his/her understanding and acceptance.       Plan Discussed with: CRNA and Surgeon  Anesthesia Plan Comments: (PAT note written 01/05/2020 by Myra Gianotti, PA-C. )       Anesthesia Quick Evaluation

## 2020-01-06 ENCOUNTER — Other Ambulatory Visit (HOSPITAL_COMMUNITY)
Admission: RE | Admit: 2020-01-06 | Discharge: 2020-01-06 | Disposition: A | Discharge status: Home / Self Care | Payer: 59 | Source: Ambulatory Visit | Attending: Surgery | Admitting: Surgery

## 2020-01-06 DIAGNOSIS — Z20822 Contact with and (suspected) exposure to covid-19: Secondary | ICD-10-CM | POA: Diagnosis not present

## 2020-01-06 DIAGNOSIS — Z01812 Encounter for preprocedural laboratory examination: Secondary | ICD-10-CM | POA: Insufficient documentation

## 2020-01-06 LAB — SARS CORONAVIRUS 2 (TAT 6-24 HRS): SARS Coronavirus 2: NEGATIVE

## 2020-01-07 ENCOUNTER — Other Ambulatory Visit (HOSPITAL_COMMUNITY): Payer: 59

## 2020-01-10 ENCOUNTER — Observation Stay (HOSPITAL_COMMUNITY)
Admission: RE | Admit: 2020-01-10 | Discharge: 2020-01-11 | Disposition: A | Discharge status: Home / Self Care | Payer: 59 | Attending: Plastic Surgery | Admitting: Plastic Surgery

## 2020-01-10 ENCOUNTER — Encounter (HOSPITAL_COMMUNITY)
Admission: RE | Disposition: A | Discharge status: Home / Self Care | Payer: Self-pay | Source: Home / Self Care | Attending: Plastic Surgery

## 2020-01-10 ENCOUNTER — Ambulatory Visit (HOSPITAL_COMMUNITY): Payer: 59 | Admitting: Physician Assistant

## 2020-01-10 ENCOUNTER — Encounter (HOSPITAL_COMMUNITY): Payer: Self-pay | Admitting: Surgery

## 2020-01-10 ENCOUNTER — Ambulatory Visit (HOSPITAL_COMMUNITY): Payer: 59 | Admitting: Certified Registered"

## 2020-01-10 DIAGNOSIS — C50912 Malignant neoplasm of unspecified site of left female breast: Secondary | ICD-10-CM | POA: Diagnosis present

## 2020-01-10 DIAGNOSIS — C50812 Malignant neoplasm of overlapping sites of left female breast: Secondary | ICD-10-CM

## 2020-01-10 DIAGNOSIS — C50919 Malignant neoplasm of unspecified site of unspecified female breast: Secondary | ICD-10-CM | POA: Diagnosis present

## 2020-01-10 DIAGNOSIS — Z171 Estrogen receptor negative status [ER-]: Secondary | ICD-10-CM

## 2020-01-10 HISTORY — PX: MASTECTOMY MODIFIED RADICAL: SHX5962

## 2020-01-10 HISTORY — PX: BREAST RECONSTRUCTION WITH PLACEMENT OF TISSUE EXPANDER AND FLEX HD (ACELLULAR HYDRATED DERMIS): SHX6295

## 2020-01-10 LAB — POCT PREGNANCY, URINE: Preg Test, Ur: NEGATIVE

## 2020-01-10 SURGERY — MASTECTOMY, MODIFIED RADICAL
Anesthesia: General | Site: Breast | Laterality: Bilateral

## 2020-01-10 MED ORDER — SODIUM CHLORIDE 0.9 % IV SOLN
INTRAVENOUS | Status: DC
  Filled 2020-01-10 (×2): qty 500

## 2020-01-10 MED ORDER — ACETAMINOPHEN 500 MG PO TABS: 1000.0000 mg | ORAL_TABLET | Freq: Four times a day (QID) | ORAL | Status: DC | PRN

## 2020-01-10 MED ORDER — 0.9 % SODIUM CHLORIDE (POUR BTL) OPTIME
TOPICAL | Status: DC | PRN
  Administered 2020-01-10: 1000 mL

## 2020-01-10 MED ORDER — KETAMINE HCL 50 MG/5ML IJ SOSY
PREFILLED_SYRINGE | INTRAMUSCULAR | Status: AC
  Filled 2020-01-10: qty 5

## 2020-01-10 MED ORDER — FENTANYL CITRATE (PF) 100 MCG/2ML IJ SOLN
INTRAMUSCULAR | Status: DC | PRN
Start: 2020-01-10 — End: 2020-01-10
  Administered 2020-01-10 (×3): 50 ug via INTRAVENOUS
  Administered 2020-01-10: 100 ug via INTRAVENOUS

## 2020-01-10 MED ORDER — ONDANSETRON HCL 4 MG/2ML IJ SOLN
INTRAMUSCULAR | Status: AC
  Filled 2020-01-10: qty 2

## 2020-01-10 MED ORDER — FENTANYL CITRATE (PF) 250 MCG/5ML IJ SOLN
INTRAMUSCULAR | Status: AC
  Filled 2020-01-10: qty 5

## 2020-01-10 MED ORDER — SCOPOLAMINE 1 MG/3DAYS TD PT72
MEDICATED_PATCH | TRANSDERMAL | Status: AC
  Filled 2020-01-10: qty 1

## 2020-01-10 MED ORDER — SODIUM BICARBONATE 4 % IV SOLN
Freq: Once | INTRAVENOUS | Status: DC
  Filled 2020-01-10: qty 50

## 2020-01-10 MED ORDER — LACTATED RINGERS IV SOLN: INTRAVENOUS | Status: DC

## 2020-01-10 MED ORDER — SODIUM CHLORIDE 0.9 % IR SOLN
Status: DC | PRN
  Administered 2020-01-10 (×2): 1000 mL

## 2020-01-10 MED ORDER — ONDANSETRON HCL 4 MG/2ML IJ SOLN: 4.0000 mg | Freq: Four times a day (QID) | INTRAMUSCULAR | Status: DC | PRN

## 2020-01-10 MED ORDER — BUPIVACAINE HCL (PF) 0.25 % IJ SOLN
INTRAMUSCULAR | Status: AC
  Filled 2020-01-10: qty 30

## 2020-01-10 MED ORDER — LIDOCAINE 2% (20 MG/ML) 5 ML SYRINGE
INTRAMUSCULAR | Status: DC | PRN
  Administered 2020-01-10: 100 mg via INTRAVENOUS

## 2020-01-10 MED ORDER — CHLORHEXIDINE GLUCONATE 0.12 % MT SOLN
OROMUCOSAL | Status: AC
  Administered 2020-01-10: 15 mL via OROMUCOSAL
  Filled 2020-01-10: qty 15

## 2020-01-10 MED ORDER — SODIUM CHLORIDE 0.9 % IR SOLN: Status: DC | PRN

## 2020-01-10 MED ORDER — CEFAZOLIN SODIUM-DEXTROSE 2-4 GM/100ML-% IV SOLN
2.0000 g | INTRAVENOUS | Status: AC
  Administered 2020-01-10: 2 g via INTRAVENOUS
  Filled 2020-01-10: qty 100

## 2020-01-10 MED ORDER — LIDOCAINE 2% (20 MG/ML) 5 ML SYRINGE
INTRAMUSCULAR | Status: AC
  Filled 2020-01-10: qty 5

## 2020-01-10 MED ORDER — CHLORHEXIDINE GLUCONATE CLOTH 2 % EX PADS: 6.0000 | MEDICATED_PAD | Freq: Once | CUTANEOUS | Status: DC

## 2020-01-10 MED ORDER — HYDROCODONE-ACETAMINOPHEN 5-325 MG PO TABS: 1.0000 | ORAL_TABLET | Freq: Four times a day (QID) | ORAL | Status: DC | PRN

## 2020-01-10 MED ORDER — ONDANSETRON 4 MG PO TBDP: 4.0000 mg | ORAL_TABLET | Freq: Four times a day (QID) | ORAL | Status: DC | PRN

## 2020-01-10 MED ORDER — LEVOTHYROXINE SODIUM 88 MCG PO TABS
88.0000 ug | ORAL_TABLET | Freq: Every day | ORAL | Status: DC
  Administered 2020-01-11: 88 ug via ORAL
  Filled 2020-01-10: qty 1

## 2020-01-10 MED ORDER — ONDANSETRON HCL 4 MG/2ML IJ SOLN: 4.0000 mg | Freq: Once | INTRAMUSCULAR | Status: DC | PRN

## 2020-01-10 MED ORDER — HYDROMORPHONE HCL 1 MG/ML IJ SOLN
0.2500 mg | INTRAMUSCULAR | Status: DC | PRN
  Administered 2020-01-10 (×2): 0.5 mg via INTRAVENOUS

## 2020-01-10 MED ORDER — BUPIVACAINE-EPINEPHRINE (PF) 0.5% -1:200000 IJ SOLN
INTRAMUSCULAR | Status: DC | PRN
  Administered 2020-01-10: 30 mL

## 2020-01-10 MED ORDER — INDOCYANINE GREEN 25 MG IV SOLR
INTRAVENOUS | Status: DC | PRN
  Administered 2020-01-10 (×2): 7.5 mg via INTRAVENOUS

## 2020-01-10 MED ORDER — CHLORHEXIDINE GLUCONATE 0.12 % MT SOLN: 15.0000 mL | Freq: Once | OROMUCOSAL | Status: AC

## 2020-01-10 MED ORDER — ONDANSETRON HCL 4 MG/2ML IJ SOLN
INTRAMUSCULAR | Status: DC | PRN
  Administered 2020-01-10: 4 mg via INTRAVENOUS

## 2020-01-10 MED ORDER — PHENYLEPHRINE 40 MCG/ML (10ML) SYRINGE FOR IV PUSH (FOR BLOOD PRESSURE SUPPORT)
PREFILLED_SYRINGE | INTRAVENOUS | Status: DC | PRN
  Administered 2020-01-10 (×3): 80 ug via INTRAVENOUS
  Administered 2020-01-10: 160 ug via INTRAVENOUS
  Administered 2020-01-10 (×2): 120 ug via INTRAVENOUS

## 2020-01-10 MED ORDER — ORAL CARE MOUTH RINSE: 15.0000 mL | Freq: Once | OROMUCOSAL | Status: AC

## 2020-01-10 MED ORDER — SULFAMETHOXAZOLE-TRIMETHOPRIM 800-160 MG PO TABS
1.0000 | ORAL_TABLET | Freq: Two times a day (BID) | ORAL | Status: DC
  Administered 2020-01-11: 1 via ORAL
  Filled 2020-01-10 (×2): qty 1

## 2020-01-10 MED ORDER — PROPOFOL 10 MG/ML IV BOLUS
INTRAVENOUS | Status: AC
  Filled 2020-01-10: qty 40

## 2020-01-10 MED ORDER — BUPIVACAINE-EPINEPHRINE (PF) 0.25% -1:200000 IJ SOLN
INTRAMUSCULAR | Status: AC
  Filled 2020-01-10: qty 300

## 2020-01-10 MED ORDER — HYDROCODONE-ACETAMINOPHEN 5-325 MG PO TABS
1.0000 | ORAL_TABLET | ORAL | Status: DC | PRN
  Administered 2020-01-10: 1 via ORAL
  Administered 2020-01-10: 2 via ORAL
  Administered 2020-01-11 (×2): 1 via ORAL
  Filled 2020-01-10 (×3): qty 1
  Filled 2020-01-10: qty 2

## 2020-01-10 MED ORDER — FENTANYL CITRATE (PF) 100 MCG/2ML IJ SOLN: 50.0000 ug | Freq: Once | INTRAMUSCULAR | Status: AC

## 2020-01-10 MED ORDER — DEXAMETHASONE SODIUM PHOSPHATE 10 MG/ML IJ SOLN
INTRAMUSCULAR | Status: AC
  Filled 2020-01-10: qty 1

## 2020-01-10 MED ORDER — PROPOFOL 10 MG/ML IV BOLUS
INTRAVENOUS | Status: DC | PRN
  Administered 2020-01-10: 150 mg via INTRAVENOUS
  Administered 2020-01-10: 40 mg via INTRAVENOUS

## 2020-01-10 MED ORDER — MIDAZOLAM HCL 2 MG/2ML IJ SOLN: 2.0000 mg | Freq: Once | INTRAMUSCULAR | Status: AC

## 2020-01-10 MED ORDER — HYDROMORPHONE HCL 1 MG/ML IJ SOLN
INTRAMUSCULAR | Status: AC
Start: 2020-01-10 — End: 2020-01-11
  Filled 2020-01-10: qty 1

## 2020-01-10 MED ORDER — FENTANYL CITRATE (PF) 100 MCG/2ML IJ SOLN
INTRAMUSCULAR | Status: AC
Start: 2020-01-10 — End: 2020-01-10
  Administered 2020-01-10: 50 ug via INTRAVENOUS
  Filled 2020-01-10: qty 2

## 2020-01-10 MED ORDER — PHENYLEPHRINE 40 MCG/ML (10ML) SYRINGE FOR IV PUSH (FOR BLOOD PRESSURE SUPPORT)
PREFILLED_SYRINGE | INTRAVENOUS | Status: AC
  Filled 2020-01-10: qty 10

## 2020-01-10 MED ORDER — MEPERIDINE HCL 25 MG/ML IJ SOLN: 6.2500 mg | INTRAMUSCULAR | Status: DC | PRN

## 2020-01-10 MED ORDER — DEXAMETHASONE SODIUM PHOSPHATE 10 MG/ML IJ SOLN
INTRAMUSCULAR | Status: DC | PRN
  Administered 2020-01-10: 10 mg via INTRAVENOUS

## 2020-01-10 MED ORDER — SCOPOLAMINE 1 MG/3DAYS TD PT72
MEDICATED_PATCH | TRANSDERMAL | Status: DC | PRN
  Administered 2020-01-10: 1 via TRANSDERMAL

## 2020-01-10 MED ORDER — LIDOCAINE-EPINEPHRINE 1 %-1:100000 IJ SOLN
INTRAMUSCULAR | Status: DC | PRN
  Administered 2020-01-10: 30 mL

## 2020-01-10 MED ORDER — MIDAZOLAM HCL 2 MG/2ML IJ SOLN
INTRAMUSCULAR | Status: AC
  Administered 2020-01-10: 2 mg via INTRAVENOUS
  Filled 2020-01-10: qty 2

## 2020-01-10 SURGICAL SUPPLY — 86 items
APPLIER CLIP 9.375 MED OPEN (MISCELLANEOUS) ×4
BENZOIN TINCTURE PRP APPL 2/3 (GAUZE/BANDAGES/DRESSINGS) ×4 IMPLANT
BINDER BREAST LRG (GAUZE/BANDAGES/DRESSINGS) IMPLANT
BINDER BREAST XLRG (GAUZE/BANDAGES/DRESSINGS) IMPLANT
BIOPATCH RED 1 DISK 7.0 (GAUZE/BANDAGES/DRESSINGS) ×4 IMPLANT
BLADE SURG 15 STRL LF DISP TIS (BLADE) ×1 IMPLANT
BLADE SURG 15 STRL SS (BLADE) ×2
BNDG ELASTIC 6X5.8 VLCR STR LF (GAUZE/BANDAGES/DRESSINGS) ×4 IMPLANT
BNDG GAUZE ELAST 4 BULKY (GAUZE/BANDAGES/DRESSINGS) IMPLANT
CANISTER SUCT 3000ML PPV (MISCELLANEOUS) ×2 IMPLANT
CHLORAPREP W/TINT 26 (MISCELLANEOUS) ×4 IMPLANT
CLIP APPLIE 9.375 MED OPEN (MISCELLANEOUS) ×2 IMPLANT
COVER SURGICAL LIGHT HANDLE (MISCELLANEOUS) ×4 IMPLANT
COVER WAND RF STERILE (DRAPES) ×2 IMPLANT
DECANTER SPIKE VIAL GLASS SM (MISCELLANEOUS) IMPLANT
DERMABOND ADVANCED (GAUZE/BANDAGES/DRESSINGS)
DERMABOND ADVANCED .7 DNX12 (GAUZE/BANDAGES/DRESSINGS) IMPLANT
DRAIN CHANNEL 15F RND FF W/TCR (WOUND CARE) IMPLANT
DRAIN CHANNEL 19F RND (DRAIN) IMPLANT
DRAPE HALF SHEET 70X43 (DRAPES) ×4 IMPLANT
DRAPE ORTHO SPLIT 77X108 STRL (DRAPES) ×4
DRAPE SURG ORHT 6 SPLT 77X108 (DRAPES) ×2 IMPLANT
DRSG PAD ABDOMINAL 8X10 ST (GAUZE/BANDAGES/DRESSINGS) ×8 IMPLANT
DRSG TEGADERM 2-3/8X2-3/4 SM (GAUZE/BANDAGES/DRESSINGS) ×4 IMPLANT
DRSG TEGADERM 4X10 (GAUZE/BANDAGES/DRESSINGS) IMPLANT
DRSG TEGADERM 4X4.75 (GAUZE/BANDAGES/DRESSINGS) IMPLANT
ELECT BLADE 4.0 EZ CLEAN MEGAD (MISCELLANEOUS) ×2
ELECT COATED BLADE 2.86 ST (ELECTRODE) ×2 IMPLANT
ELECT REM PT RETURN 9FT ADLT (ELECTROSURGICAL) ×4
ELECTRODE BLDE 4.0 EZ CLN MEGD (MISCELLANEOUS) ×1 IMPLANT
ELECTRODE REM PT RTRN 9FT ADLT (ELECTROSURGICAL) ×2 IMPLANT
EVACUATOR SILICONE 100CC (DRAIN) ×4 IMPLANT
EXPANDER TISSUE CPX4 550CC (Expander) ×4 IMPLANT
GAUZE SPONGE 4X4 12PLY STRL (GAUZE/BANDAGES/DRESSINGS) ×4 IMPLANT
GLOVE BIO SURGEON STRL SZ7 (GLOVE) ×2 IMPLANT
GLOVE BIO SURGEON STRL SZ7.5 (GLOVE) ×2 IMPLANT
GLOVE BIO SURGEON STRL SZ8 (GLOVE) ×2 IMPLANT
GLOVE BIOGEL M 7.0 STRL (GLOVE) ×2 IMPLANT
GLOVE BIOGEL M STRL SZ7.5 (GLOVE) ×4 IMPLANT
GLOVE BIOGEL PI IND STRL 8 (GLOVE) ×1 IMPLANT
GLOVE BIOGEL PI INDICATOR 8 (GLOVE) ×1
GOWN STRL REUS W/ TWL LRG LVL3 (GOWN DISPOSABLE) ×5 IMPLANT
GOWN STRL REUS W/ TWL XL LVL3 (GOWN DISPOSABLE) ×1 IMPLANT
GOWN STRL REUS W/TWL LRG LVL3 (GOWN DISPOSABLE) ×10
GOWN STRL REUS W/TWL XL LVL3 (GOWN DISPOSABLE) ×2
GRAFT FLEX HD 19X22X0.7-1.4 (Tissue) ×4 IMPLANT
KIT BASIN OR (CUSTOM PROCEDURE TRAY) ×4 IMPLANT
KIT FILL SYSTEM UNIVERSAL (SET/KITS/TRAYS/PACK) IMPLANT
KIT TURNOVER KIT B (KITS) ×2 IMPLANT
MARKER SKIN DUAL TIP RULER LAB (MISCELLANEOUS) ×4 IMPLANT
NEEDLE HYPO 25X1 1.5 SAFETY (NEEDLE) IMPLANT
NS IRRIG 1000ML POUR BTL (IV SOLUTION) ×2 IMPLANT
PACK GENERAL/GYN (CUSTOM PROCEDURE TRAY) ×4 IMPLANT
PACK SPY-PHI (KITS) ×2 IMPLANT
PAD ARMBOARD 7.5X6 YLW CONV (MISCELLANEOUS) ×2 IMPLANT
PENCIL SMOKE EVACUATOR (MISCELLANEOUS) ×2 IMPLANT
PIN SAFETY STERILE (MISCELLANEOUS) ×2 IMPLANT
RETRACTOR ONETRAX LX 135X30 (MISCELLANEOUS) IMPLANT
SET ASEPTIC TRANSFER (MISCELLANEOUS) ×2 IMPLANT
SLEEVE SCD COMPRESS KNEE MED (MISCELLANEOUS) IMPLANT
SPECIMEN JAR X LARGE (MISCELLANEOUS) ×2 IMPLANT
SPONGE LAP 18X18 RF (DISPOSABLE) ×6 IMPLANT
STAPLER INSORB 30 2030 C-SECTI (MISCELLANEOUS) ×2 IMPLANT
STAPLER VISISTAT 35W (STAPLE) ×2 IMPLANT
STRIP CLOSURE SKIN 1/2X4 (GAUZE/BANDAGES/DRESSINGS) ×6 IMPLANT
SUT ETHILON 2 0 FS 18 (SUTURE) IMPLANT
SUT ETHILON 3 0 FSL (SUTURE) IMPLANT
SUT MNCRL AB 4-0 PS2 18 (SUTURE) ×2 IMPLANT
SUT MON AB 3-0 SH 27 (SUTURE)
SUT MON AB 3-0 SH27 (SUTURE) IMPLANT
SUT MON AB 4-0 PC3 18 (SUTURE) IMPLANT
SUT PDS AB 3-0 SH 27 (SUTURE) ×12 IMPLANT
SUT PLAIN 5 0 P 3 18 (SUTURE) IMPLANT
SUT SILK 2 0 SH (SUTURE) ×2 IMPLANT
SUT VIC AB 3-0 SH 18 (SUTURE) IMPLANT
SUT VLOC 90 P-14 23 (SUTURE) IMPLANT
SYR 50ML LL SCALE MARK (SYRINGE) IMPLANT
SYR BULB IRRIG 60ML STRL (SYRINGE) ×2 IMPLANT
SYR CONTROL 10ML LL (SYRINGE) IMPLANT
TAPE MEASURE VINYL STERILE (MISCELLANEOUS) IMPLANT
TOWEL GREEN STERILE (TOWEL DISPOSABLE) ×2 IMPLANT
TOWEL GREEN STERILE FF (TOWEL DISPOSABLE) ×6 IMPLANT
TRAY FOLEY W/BAG SLVR 14FR LF (SET/KITS/TRAYS/PACK) ×2 IMPLANT
TUBE CONNECTING 12X1/4 (SUCTIONS) ×2 IMPLANT
TUBE CONNECTING 20X1/4 (TUBING) IMPLANT
YANKAUER SUCT BULB TIP NO VENT (SUCTIONS) ×4 IMPLANT

## 2020-01-10 NOTE — Brief Op Note (Signed)
01/10/2020  1:39 PM  PATIENT:  Heather Mckenzie  39 y.o. female  PRE-OPERATIVE DIAGNOSIS:  LEFT BREAST CANCER HIGH RISK BREAST CANCER  POST-OPERATIVE DIAGNOSIS:  LEFT BREAST CANCER HIGH RISK BREAST CANCER  PROCEDURE:  Procedure(s) with comments: LEFT MODIFIED RADICAL MASTECTOMY, RIGHT RISK REDUCING MASTECTOMY (Bilateral) - PEC BLOCK, RNFA BILATERAL BREAST RECONSTRUCTION WITH PLACEMENT OF TISSUE EXPANDER AND FLEX HD (ACELLULAR HYDRATED DERMIS) (Bilateral)  SURGEON:  Surgeon(s) and Role: Panel 1:    * [name], MD - Primary Panel 2:    * [name], MD - Primary  PHYSICIAN ASSISTANT: Software engineer, PA  ASSISTANTS: none   ANESTHESIA:   general  EBL:  100 mL   BLOOD ADMINISTERED:none  DRAINS: (2) Jackson-Pratt drain(s) with closed bulb suction in the chest   LOCAL MEDICATIONS USED:  NONE  SPECIMEN:  Source of Specimen:  mastectomy flap specimen  DISPOSITION OF SPECIMEN:  PATHOLOGY  COUNTS:  YES  TOURNIQUET:  * No tourniquets in log *  DICTATION: .Dragon Dictation  PLAN OF CARE: Admit to inpatient   PATIENT DISPOSITION:  PACU - hemodynamically stable.   Delay start of Pharmacological VTE agent (>24hrs) due to surgical blood loss or risk of bleeding: not applicable
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
## 2020-01-10 NOTE — Anesthesia Postprocedure Evaluation (Signed)
Anesthesia Post Note  Patient: ARIANNA HAYDON  Procedure(s) Performed: LEFT MODIFIED RADICAL MASTECTOMY, RIGHT RISK REDUCING MASTECTOMY (Bilateral Breast) BILATERAL BREAST RECONSTRUCTION WITH PLACEMENT OF TISSUE EXPANDER AND FLEX HD (ACELLULAR HYDRATED DERMIS) (Bilateral Breast)     Patient location during evaluation: PACU Anesthesia Type: General Level of consciousness: awake and alert Pain management: pain level controlled Vital Signs Assessment: post-procedure vital signs reviewed and stable Respiratory status: spontaneous breathing, nonlabored ventilation, respiratory function stable and patient connected to nasal cannula oxygen Cardiovascular status: blood pressure returned to baseline and stable Postop Assessment: no apparent nausea or vomiting Anesthetic complications: no   No complications documented.  Last Vitals:  Vitals:   01/10/20 0925 01/10/20 1348  BP: (!) 142/74 138/62  Pulse: (!) 151 95  Resp: 17   Temp:  36.9 C  SpO2: (!) 78% 97%    Last Pain:  Vitals:   01/10/20 0920  TempSrc:   PainSc: 0-No pain                 Coner Gibbard DAVID

## 2020-01-10 NOTE — Interval H&P Note (Signed)
History and Physical Interval Note:  01/10/2020 9:04 AM  Heather Mckenzie  has presented today for surgery, with the diagnosis of LEFT BREAST CANCER HIGH RISK BREAST CANCER.  The various methods of treatment have been discussed with the patient and family. After consideration of risks, benefits and other options for treatment, the patient has consented to  Procedure(s) with comments: LEFT MODIFIED RADICAL MASTECTOMY, RIGHT RISK REDUCING MASTECTOMY (Bilateral) - PEC BLOCK, RNFA BILATERAL BREAST RECONSTRUCTION WITH PLACEMENT OF TISSUE EXPANDER AND FLEX HD (ACELLULAR HYDRATED DERMIS) (Bilateral) as a surgical intervention.  The patient's history has been reviewed, patient examined, no change in status, stable for surgery.  I have reviewed the patient's chart and labs.  Questions were answered to the patient's satisfaction.     [name]

## 2020-01-10 NOTE — Anesthesia Procedure Notes (Addendum)
Anesthesia Regional Block: Pectoralis block   Pre-Anesthetic Checklist: ,, timeout performed, Correct Patient, Correct Site, Correct Laterality, Correct Procedure, Correct Position, site marked, Risks and benefits discussed,  Surgical consent,  Pre-op evaluation,  At surgeon's request and post-op pain management  Laterality: Left and Right  Prep: chloraprep       Needles:  Injection technique: Single-shot     Needle Length: 9cm  Needle Gauge: 21     Additional Needles:   Narrative:  Start time: 01/10/2020 9:00 AM End time: 01/10/2020 9:20 AM Injection made incrementally with aspirations every 5 mL.  Performed by: Personally  Anesthesiologist: Lillia Abed, MD  Additional Notes: Monitors applied. Patient sedated. First right side then left. Sterile prep and drape,hand hygiene and sterile gloves were used. Relevant anatomy identified.Needle position confirmed.Local anesthetic injected incrementally after negative aspiration. Local anesthetic spread visualized. Vascular puncture avoided. No complications. Images printed for medical record.The patient tolerated the procedure well.

## 2020-01-10 NOTE — Op Note (Signed)
Preoperative diagnosis: Left breast cancer upper outer quadrant stage III and high risk of breast cancer  Postop diagnosis: Same  Procedure: Left modified versus mastectomy with right risk reducing simple mastectomy  Surgeon: [name], MD  Assistant: Ewell Poe, RNFA  IV fluids: Per anesthesia record  Anesthesia: General with pectoral block  EBL: 70 cc  Specimen: Left breast  with axillary contents and right breast indications for procedure: The patient is a 39 year old female who is completed neoadjuvant chemotherapy for locally advanced left breast cancer.  She presents for bilateral mastectomies with the right being risk reducing in the left being a modified radical segment due to significant preoperative adenopathy.  Risk, benefits and long-term expectations.  She will also undergo reconstruction by plastic surgery.  All questions were answered.  The procedure was reviewed as well as potential complications.tcm   Description of procedure: The patient was met in the holding area.  All questions were answered.  Bilateral pectoral blocks were placed anesthesia.  She was taken the operative room.  She is placed supine upon the OR table.  After induction of general seizure, both breasts were prepped and draped in sterile fashion timeout performed.  Proper patient, site and procedure were verified.  The right side was done first.  Curvilinear incision was made above below the nipple areolar complex.  Superior skin flap was taken to the clavicle while the inferior skin flap was taken down to the inframammary fold.  The medial skin flap was taken to the midline.  The breast was then dissected off the chest wall in a medial to lateral fashion to include the nipple areolar complex until the lateral attachments were encountered and divided.  Specimen was then oriented.  Hemostasis achieved with no evidence of any bleeding at this point time.  Wet gauze was used to pack the wound dry towel was  used to cover.  In a similar fashion the left side was done.  Curvilinear incision was made above the below the nipple areolar complex.  Dissection was carried to the clavicle superiorly, the inframammary fold inferiorly, and to the sternum medially.  The breast was then dissected off the chest wall in a medial to lateral fashion.  The extra contents were encountered.  These were dissected out.  The long thoracic nerve, thoracodorsal trunk and axillary vein were all visualized and preserved.  All axillary contents in level 1 were excised as well as up under level 2.  These were taken with the mastectomy.  These were passed off the field has 1 unit.  Irrigation was used.  Hemostasis achieved.  The long thoracic nerve, thoracodorsal trunk and axillary vein were all preserved.  Palpation revealed no other adenopathy in the level 2 or level 3 regions that I can feel.  At this point the case skin flaps are viable.  Hemostasis achieved.  Plastic surgery took care of the case.  See their note.  All counts were correct.  The patient was hemodynamically stable.Marland Kitchen

## 2020-01-10 NOTE — Anesthesia Procedure Notes (Signed)
Procedure Name: LMA Insertion Date/Time: 01/10/2020 9:43 AM Performed by: Imagene Riches, CRNA Pre-anesthesia Checklist: Patient identified, Emergency Drugs available, Suction available and Patient being monitored Patient Re-evaluated:Patient Re-evaluated prior to induction Oxygen Delivery Method: Circle System Utilized Preoxygenation: Pre-oxygenation with 100% oxygen Induction Type: IV induction Ventilation: Mask ventilation without difficulty LMA: LMA inserted LMA Size: 4.0 Number of attempts: 1 Airway Equipment and Method: Bite block Placement Confirmation: positive ETCO2 Tube secured with: Tape Dental Injury: Teeth and Oropharynx as per pre-operative assessment

## 2020-01-10 NOTE — Transfer of Care (Signed)
Immediate Anesthesia Transfer of Care Note  Patient: Heather Mckenzie  Procedure(s) Performed: LEFT MODIFIED RADICAL MASTECTOMY, RIGHT RISK REDUCING MASTECTOMY (Bilateral Breast) BILATERAL BREAST RECONSTRUCTION WITH PLACEMENT OF TISSUE EXPANDER AND FLEX HD (ACELLULAR HYDRATED DERMIS) (Bilateral Breast)  Patient Location: PACU  Anesthesia Type:GA combined with regional for post-op pain  Level of Consciousness: drowsy  Airway & Oxygen Therapy: Patient Spontanous Breathing and Patient connected to nasal cannula oxygen  Post-op Assessment: Report given to RN and Post -op Vital signs reviewed and stable  Post vital signs: Reviewed and stable  Last Vitals:  Vitals Value Taken Time  BP 138/62 01/10/20 1348  Temp    Pulse 92 01/10/20 1350  Resp 17 01/10/20 1350  SpO2 100 % 01/10/20 1350  Vitals shown include unvalidated device data.  Last Pain:  Vitals:   01/10/20 0920  TempSrc:   PainSc: 0-No pain      Patients Stated Pain Goal: 3 (48/01/65 5374)  Complications: No complications documented.

## 2020-01-10 NOTE — Op Note (Signed)
Operative Note   DATE OF OPERATION: 01/10/2020  SURGICAL DEPARTMENT: Plastic Surgery  PREOPERATIVE DIAGNOSES: Breast cancer  POSTOPERATIVE DIAGNOSES:  same  PROCEDURE: 1. Bilateral breast reconstruction with tissue expanders and acellular dermal matrix 2.  Indocyanine green angiography of bilateral mastectomy flaps  SURGEON: Talmadge Coventry, MD  ASSISTANT: Verdie Shire, PA The advanced practice practitioner (APP) assisted throughout the case.  The APP was essential in retraction and counter traction when needed to make the case progress smoothly.  This retraction and assistance made it possible to see the tissue plans for the procedure.  The assistance was needed for blood control, tissue re-approximation and assisted with closure of the incision site.  ANESTHESIA:  General.   COMPLICATIONS: None.   INDICATIONS FOR PROCEDURE:  The patient, Heather Mckenzie is a 39 y.o. female born on [birth year], is here for treatment of bilateral mastectomy defects after surgical treatment for breast cancer. MRN: [id number]  CONSENT:  Informed consent was obtained directly from the patient. Risks, benefits and alternatives were fully discussed. Specific risks including but not limited to bleeding, infection, hematoma, seroma, scarring, pain, contracture, asymmetry, wound healing problems, and need for further surgery were all discussed. The patient did have an ample opportunity to have questions answered to satisfaction.   DESCRIPTION OF PROCEDURE:  The patient was taken to the operating room. SCDs were placed and Ancef antibiotics were given.  General anesthesia was administered.  The patient's operative site was prepped and draped in a sterile fashion. A time out was performed and all information was confirmed to be correct.  Dr. [name] performed his bilateral mastectomy and then turned the patient over to me.  I started by examining the mastectomy flaps.  Externally the looks good.  Indocyanine  green angiography was then performed revealing several dark areas along the inferior mastectomy flap on the right and the superior mastectomy flap on the left.  These were debrided and excised with a 15 blade.  Everything that was excised was sent to pathology for specimen.  Hemostasis was ensured in both pockets irrigated with triple antibiotic solution.  JP drains were placed on each side and secured with a 3-0 nylon suture.  2 pieces of Flex HD pliable.  He was brought onto the field and soaked in saline.  3-0 PDS was run around the periphery as a pursestring.  The tissue expanders were then brought onto the field.  I chose a Mentor CPX 4 medium height smooth tissue expander with suture tabs.  This had a volume of 550 cc.  The serial number for the left side is [id number].  Serial number for the right is [id number].  The expanders were then totally deflated of air and placed within the matrix and the pursestring was tied down to surrounding.  Inferior and lateral suture tabs were brought out for securing to the chest wall.  350 cc of saline were then placed into each expander.  This generated no tension on the mastectomy flaps.  Temporary closure was then performed with skin staples.  Angiography was then performed again with the spy system and identified a persistent area of poor perfusion on the right inferior flap.  This was further debrided back another couple centimeters to a viable level.  There was still no tension on the mastectomy flap with closure.  At this point the incision was closed with a combination of an sorb stapler and 3-0 PDS sutures.  A 3 OV lock was then used as a subcuticular.  Steri-Strips were  then applied followed by a soft dressing and an Ace wrap.  The patient tolerated the procedure well.  There were no complications. The patient was allowed to wake from anesthesia, extubated and taken to the recovery room in satisfactory condition.

## 2020-01-11 ENCOUNTER — Encounter (HOSPITAL_COMMUNITY): Payer: Self-pay | Admitting: Surgery

## 2020-01-11 DIAGNOSIS — C50912 Malignant neoplasm of unspecified site of left female breast: Secondary | ICD-10-CM | POA: Diagnosis not present

## 2020-01-11 LAB — HIV ANTIBODY (ROUTINE TESTING W REFLEX): HIV Screen 4th Generation wRfx: NONREACTIVE

## 2020-01-11 NOTE — Progress Notes (Signed)
Patient alert and oriented, mae's well, voiding adequate amount of urine, swallowing without difficulty, no c/o pain at time of discharge. Patient discharged home with family. Discharged instructions given to patient. Patient and family stated understanding of instructions given. Patient has an appointment with Dr. Claudia Desanctis

## 2020-01-11 NOTE — Discharge Summary (Signed)
Physician Discharge Summary  Patient ID: EDWARD TREVINO MRN: [id number] DOB/AGE: [birth year] 39 y.o.  Admit date: 01/10/2020 Discharge date: 01/11/2020  Admission Diagnoses:  Discharge Diagnoses:  Active Problems:   Breast cancer [facility])   Discharged Condition: good  Hospital Course: Patient is resting comfortably in bed.  Reports she is more sore this morning, but pain is well controlled. No overnight events. Ace Wrap in place, clean and dry.  Incisions intact, c/d/i. Bilateral drains in place with serosanguinous fluid in bulb. Patient denies F/C, CP, SOB, N/V.  Consults: None  Significant Diagnostic Studies: Surgical tissue sent to pathology  Treatments: surgery: bilateral mastectomies with placement of tissue expanders and flex HD.  Discharge Exam: Blood pressure (!) 147/91, pulse 85, temperature 98.5 F (36.9 C), temperature source Oral, resp. rate 16, height 5\' 3"  (1.6 m), weight 97.1 kg, SpO2 100 %. General appearance: alert, cooperative and no distress Head: Normocephalic, without obvious abnormality, atraumatic Eyes: Left eye EOMS intact.  Resp: nonlabored Breasts: Incisions intact, clean and dry. Bandages and ACE wrap in place. Bilateral drains in place with serosanguinous fluid in bulbs.   Disposition: Discharge disposition: 01-Home or Self Care       Discharge Instructions    Call MD for:  difficulty breathing, headache or visual disturbances   Complete by: As directed    Call MD for:  extreme fatigue   Complete by: As directed    Call MD for:  hives   Complete by: As directed    Call MD for:  persistant dizziness or light-headedness   Complete by: As directed    Call MD for:  persistant nausea and vomiting   Complete by: As directed    Call MD for:  redness, tenderness, or signs of infection (pain, swelling, redness, odor or green/yellow discharge around incision site)   Complete by: As directed    Call MD for:  severe uncontrolled pain   Complete by: As  directed    Call MD for:  temperature >100.4   Complete by: As directed    Diet - low sodium heart healthy   Complete by: As directed    Increase activity slowly   Complete by: As directed      Allergies as of 01/11/2020   No Known Allergies     Medication List    STOP taking these medications   fluconazole 100 MG tablet Commonly known as: DIFLUCAN   lidocaine-prilocaine cream Commonly known as: EMLA   LORazepam 0.5 MG tablet Commonly known as: Ativan   ondansetron 4 MG disintegrating tablet Commonly known as: ZOFRAN-ODT   ondansetron 8 MG tablet Commonly known as: Zofran   prochlorperazine 10 MG tablet Commonly known as: COMPAZINE     TAKE these medications   Amethyst 90-20 MCG tablet Generic drug: levonorgestrel-ethinyl estradiol Take 1 tablet by mouth daily. (28) 90 mcg-20 mcg tablet   levothyroxine 88 MCG tablet Commonly known as: SYNTHROID TAKE 1 TABLET(88 MCG) BY MOUTH DAILY BEFORE BREAKFAST What changed: See the new instructions.       Dr.Pace [address] [place], [place] [address] [phone]  Signed: Threasa Heads 01/11/2020, 8:23 AM

## 2020-01-11 NOTE — Discharge Instructions (Signed)
Activity As tolerated: NO showers for 3 days No heavy activities  Diet: Regular  Wound Care: Keep dressing clean & dry for 3 days.  Keep wrap applied with compression as much as possible.    Do not change dressings for 3 days unless soiled.  Can change if needed but make sure to reapply wrap. After three days can remove wrap and shower.  Then reapply dressings if needed and continue compression with wrap or soft sports bra. Call doctor if any unusual problems occur such as pain, excessive bleeding, unrelieved nausea/vomiting, fever &/or chills  Follow-up appointment: Scheduled for next week.  Empty drains at least one time per day and record volume.

## 2020-01-12 LAB — SURGICAL PATHOLOGY

## 2020-01-16 ENCOUNTER — Encounter: Payer: Self-pay | Admitting: *Deleted

## 2020-01-16 DIAGNOSIS — Z171 Estrogen receptor negative status [ER-]: Secondary | ICD-10-CM

## 2020-01-17 ENCOUNTER — Telehealth: Payer: Self-pay

## 2020-01-17 NOTE — Telephone Encounter (Signed)
Patient states she had surgery last Tuesday and still has her drainage tubes in.  The one on the left is really dark red and she wants to be sure that this is normal.  Please call.

## 2020-01-18 ENCOUNTER — Ambulatory Visit (INDEPENDENT_AMBULATORY_CARE_PROVIDER_SITE_OTHER): Payer: 59 | Admitting: Plastic Surgery

## 2020-01-18 ENCOUNTER — Encounter: Payer: Self-pay | Admitting: Plastic Surgery

## 2020-01-18 ENCOUNTER — Other Ambulatory Visit: Payer: Self-pay

## 2020-01-18 VITALS — BP 104/71 | HR 90 | Temp 98.6°F

## 2020-01-18 DIAGNOSIS — Z171 Estrogen receptor negative status [ER-]: Secondary | ICD-10-CM

## 2020-01-18 DIAGNOSIS — C50812 Malignant neoplasm of overlapping sites of left female breast: Secondary | ICD-10-CM

## 2020-01-18 NOTE — Progress Notes (Signed)
Patient presents postop from bilateral breast reconstruction with tissue expanders and acellular dermal matrix.  She overall feels like she is doing well.  There is a drain on the left is still putting out 40 to 60 cc a day of darker appearing fluid.  The one on the right side has put out very little of more singular serosanguineous fluid.  I removed right side drain today and also injected 50 cc of saline into both expanders.  This puts her at a current fill volume of around 400 cc on both sides, as I had placed 350 cc into the expanders intraoperatively.  Her skin at the moment looks to be healing fine with no areas of concern at this point.  We will have her continue to wear compressive garment and return next week when we can hopefully remove her drain on the left side.

## 2020-01-18 NOTE — Progress Notes (Signed)
Heather Mckenzie Care Team: Heather Mckenzie, No Pcp Per as PCP - General (General Practice) [name], RN as Oncology Nurse Navigator [name], RN as Oncology Nurse Navigator [name], MD as Consulting Physician (General Surgery) [name], MD as Consulting Physician (Hematology and Oncology) [name], MD as Consulting Physician (Radiation Oncology)  DIAGNOSIS:    ICD-10-CM   1. Malignant neoplasm of overlapping sites of left breast in female, estrogen receptor negative ([place])  C50.812    Z17.1     SUMMARY OF ONCOLOGIC HISTORY: Oncology History  Malignant neoplasm of overlapping sites of left breast in female, estrogen receptor negative ([place])  04/25/2019 Initial Diagnosis   Heather Mckenzie palpated a left breast and axillary mass x78month. [place] left breast showed a 3.6cm mass at the 1 o'clock position with 6 smaller adjacent masses extending toward the nipple ranging in size from 1.1cm to 4.0cm. [place] the left axilla showed five bulky lymph nodes, the largest measuring 4.2cm, and second largest measuring 2.8cm. 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    04/27/2019 Cancer Staging   Staging form: Breast, AJCC 8th Edition - Clinical: Stage IIIC (cT1c, cN2a, cM0, G3, ER-, PR-, HER2-) - Signed by GNicholas Lose MD on 04/27/2019   05/12/2019 -  Chemotherapy   The Heather Mckenzie had DOXOrubicin (ADRIAMYCIN) chemo injection 128 mg, 60 mg/m2 = 128 mg, Intravenous,  Once, 4 of 4 cycles Administration: 128 mg (05/12/2019), 128 mg (06/03/2019), 128 mg (06/23/2019), 128 mg (07/14/2019) palonosetron (ALOXI) injection 0.25 mg, 0.25 mg, Intravenous,  Once, 8 of 8 cycles Administration: 0.25 mg (05/12/2019), 0.25 mg (08/04/2019), 0.25 mg (06/03/2019), 0.25 mg (09/01/2019), 0.25 mg (06/23/2019), 0.25 mg (07/14/2019), 0.25 mg (09/22/2019), 0.25 mg (10/13/2019) pegfilgrastim-cbqv (UDENYCA) injection 6 mg, 6 mg, Subcutaneous, Once, 4 of 4 cycles Administration: 6 mg  (05/14/2019), 6 mg (06/06/2019), 6 mg (06/25/2019), 6 mg (07/16/2019) CARBOplatin (PARAPLATIN) 700 mg in sodium chloride 0.9 % 250 mL chemo infusion, 700 mg (100 % of original dose 700 mg), Intravenous,  Once, 4 of 4 cycles Dose modification: 700 mg (original dose 700 mg, Cycle 5) Administration: 700 mg (08/04/2019), 700 mg (09/01/2019), 700 mg (09/22/2019), 700 mg (10/13/2019) cyclophosphamide (CYTOXAN) 1,280 mg in sodium chloride 0.9 % 250 mL chemo infusion, 600 mg/m2 = 1,280 mg, Intravenous,  Once, 4 of 4 cycles Administration: 1,280 mg (05/12/2019), 1,280 mg (06/03/2019), 1,280 mg (06/23/2019), 1,280 mg (07/14/2019) PACLitaxel (TAXOL) 174 mg in sodium chloride 0.9 % 250 mL chemo infusion (</= 852mm2), 80 mg/m2 = 174 mg, Intravenous,  Once, 4 of 4 cycles Dose modification: 65 mg/m2 (original dose 80 mg/m2, Cycle 6, Reason: Dose not tolerated) Administration: 174 mg (08/04/2019), 174 mg (08/11/2019), 138 mg (09/01/2019), 138 mg (08/25/2019), 138 mg (09/08/2019), 138 mg (09/15/2019), 138 mg (09/22/2019), 138 mg (09/29/2019), 138 mg (10/07/2019), 138 mg (10/13/2019), 138 mg (10/21/2019), 138 mg (10/27/2019) fosaprepitant (EMEND) 150 mg in sodium chloride 0.9 % 145 mL IVPB, 150 mg, Intravenous,  Once, 8 of 8 cycles Administration: 150 mg (05/12/2019), 150 mg (08/04/2019), 150 mg (06/03/2019), 150 mg (09/01/2019), 150 mg (06/23/2019), 150 mg (07/14/2019), 150 mg (09/22/2019), 150 mg (10/13/2019) pembrolizumab (KEYTRUDA) 200 mg in sodium chloride 0.9 % 50 mL chemo infusion, 2 mg/kg = 200 mg, Intravenous, Once, 8 of 8 cycles Administration: 200 mg (05/12/2019), 200 mg (06/03/2019), 200 mg (06/23/2019), 200 mg (07/14/2019), 200 mg (08/04/2019), 200 mg (09/01/2019), 200 mg (09/22/2019), 200 mg (10/13/2019)  for chemotherapy treatment.  Genetic Testing   Negative genetic testing. No pathogenic variants identified. VUS in HOXB13 called c.473C>A, VUS in POLD1 called c.1610G>C, and VUS in RAD50 called c.1094G>A identified on the [facility] Common Hereditary  Cancers Panel. The report date is 05/10/2019.  The Common Hereditary Cancers Panel offered by [facility] includes sequencing and/or deletion duplication testing of the following 48 genes: APC, ATM, AXIN2, BARD1, BMPR1A, BRCA1, BRCA2, BRIP1, CDH1, CDKN2A (p14ARF), CDKN2A (p16INK4a), CKD4, CHEK2, CTNNA1, DICER1, EPCAM (Deletion/duplication testing only), GREM1 (promoter region deletion/duplication testing only), KIT, MEN1, MLH1, MSH2, MSH3, MSH6, MUTYH, NBN, NF1, NHTL1, PALB2, PDGFRA, PMS2, POLD1, POLE, PTEN, RAD50, RAD51C, RAD51D, RNF43, SDHB, SDHC, SDHD, SMAD4, SMARCA4. STK11, TP53, TSC1, TSC2, and VHL.  The following genes were evaluated for sequence changes only: SDHA and HOXB13 c.251G>A variant only.   01/10/2020 Surgery   Bilateral mastectomies with reconstruction (Cornett & Pace): Right breast: no evidence tof malignancy Left breast: no evidence of residual carcinoma and 4 benign lymph nodes.      CHIEF COMPLIANT: Follow-up s/p bilateral mastectomies to review pathology   INTERVAL HISTORY: Heather Mckenzie is a 39 y.o. with above-mentioned history of triple negative left breast cancer who completed neoadjuvant chemotherapy. She underwent bilateral mastectomies with reconstruction on 01/10/20 with Dr. [name] and Dr. Claudia Desanctis for which pathology showed in the right breast, no evidence tof malignancy, and in the left breast, no evidence of residual carcinoma and 4 benign lymph nodes. She presents to the clinic todayto review the pathology report and further treatment.  She is extremely sore from the recent bilateral mastectomy surgeries.  Every day she is starting to get better.  ALLERGIES:  has No Known Allergies.  MEDICATIONS:  Current Outpatient Medications  Medication Sig Dispense Refill  . levonorgestrel-ethinyl estradiol (AMETHYST) 90-20 MCG tablet Take 1 tablet by mouth daily. (28) 90 mcg-20 mcg tablet    . levothyroxine (SYNTHROID) 88 MCG tablet TAKE 1 TABLET(88 MCG) BY MOUTH DAILY BEFORE  BREAKFAST (Heather Mckenzie taking differently: Take 88 mcg by mouth daily before breakfast. ) 30 tablet 0   No current facility-administered medications for this visit.    PHYSICAL EXAMINATION: ECOG PERFORMANCE STATUS: 1 - Symptomatic but completely ambulatory  Vitals:   01/19/20 1159  BP: 117/74  Pulse: 89  Resp: 18  Temp: (!) 97.1 F (36.2 C)  SpO2: 100%   Filed Weights   01/19/20 1159  Weight: 206 lb 9.6 oz (93.7 kg)    LABORATORY DATA:  I have reviewed the data as listed CMP Latest Ref Rng & Units 01/04/2020 10/27/2019 10/21/2019  Glucose 70 - 99 mg/dL 96 85 85  BUN 6 - 20 mg/dL '11 15 16  ' Creatinine 0.44 - 1.00 mg/dL 0.85 0.78 0.78  Sodium 135 - 145 mmol/L 138 139 140  Potassium 3.5 - 5.1 mmol/L 3.7 3.8 3.8  Chloride 98 - 111 mmol/L 102 104 103  CO2 22 - 32 mmol/L '26 25 25  ' Calcium 8.9 - 10.3 mg/dL 9.5 9.4 9.0  Total Protein 6.5 - 8.1 g/dL 7.6 7.2 7.0  Total Bilirubin 0.3 - 1.2 mg/dL 0.7 0.2(L) 0.3  Alkaline Phos 38 - 126 U/L 70 86 85  AST 15 - 41 U/L '24 20 20  ' ALT 0 - 44 U/L '17 18 18    ' Lab Results  Component Value Date   WBC 8.3 01/04/2020   HGB 12.1 01/04/2020   HCT 37.0 01/04/2020   MCV 100.3 (H) 01/04/2020   PLT 349 01/04/2020   NEUTROABS 6.1 01/04/2020    ASSESSMENT &  PLAN:  Malignant neoplasm of overlapping sites of left breast in female, estrogen receptor negative ([place]) 04/25/2019:Heather Mckenzie palpated a left breast and axillary mass x22month. [place] the left breast showed a 3.6cm mass at the 1 o'clock position with 6 smaller adjacent masses extending toward the nipple ranging in size from 1.1cm to 4.0cm. [place] the left axilla showed five bulky lymph nodes, the largest measuring 4.2cm, and second largest measuring 2.8cm. 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 T1c N2 stage IIIc Based on PET CT scan showing bilateral cervical nodes, it is stage IV(however her goal is to cure given the oligometastatic nature of her  cancer.)  01/10/2020: Bilateral mastectomies with reconstruction (Cornett & Pace): Right breast: no evidence of malignancy Left breast: no evidence of residual carcinoma and 4 benign lymph nodes.   Pathology review: Based upon complete pathologic response she had a phenomenal response to neoadjuvant chemotherapy.  Originally she had 5 bulky axillary lymph nodes.  There is suggest excellent prognosis.  Treatment plan:  1.  Adjuvant radiation  2. adjuvant Pembrolizumab maintenance therapy x9 more cycles Immune mediated adverse effects: Hypothyroidism, I renewed her prescription for Synthroid.  Return to clinic every 3 weeks for pembrolizumab.      No orders of the defined types were placed in this encounter.  The Heather Mckenzie has a good understanding of the overall plan. she agrees with it. she will call with any problems that may develop before the next visit here.  Total time spent: 30 mins including face to face time and time spent for planning, charting and coordination of care  GNicholas Lose MD 01/19/2020  I, MCloyde ReamsDorshimer, am acting as scribe for Dr. VNicholas Lose  I have reviewed the above documentation for accuracy and completeness, and I agree with the above.

## 2020-01-19 ENCOUNTER — Inpatient Hospital Stay: Payer: 59 | Attending: Hematology and Oncology | Admitting: Hematology and Oncology

## 2020-01-19 ENCOUNTER — Encounter: Payer: Self-pay | Admitting: *Deleted

## 2020-01-19 ENCOUNTER — Other Ambulatory Visit: Payer: Self-pay

## 2020-01-19 DIAGNOSIS — Z5112 Encounter for antineoplastic immunotherapy: Secondary | ICD-10-CM | POA: Insufficient documentation

## 2020-01-19 DIAGNOSIS — E039 Hypothyroidism, unspecified: Secondary | ICD-10-CM | POA: Diagnosis not present

## 2020-01-19 DIAGNOSIS — Z171 Estrogen receptor negative status [ER-]: Secondary | ICD-10-CM | POA: Diagnosis not present

## 2020-01-19 DIAGNOSIS — C50812 Malignant neoplasm of overlapping sites of left female breast: Secondary | ICD-10-CM | POA: Insufficient documentation

## 2020-01-19 MED ORDER — LEVOTHYROXINE SODIUM 88 MCG PO TABS: 88.0000 ug | ORAL_TABLET | Freq: Every day | ORAL | 3 refills | Status: DC

## 2020-01-19 NOTE — Assessment & Plan Note (Signed)
04/25/2019:Patient palpated a left breast and axillary mass x9month. [place] the left breast showed a 3.6cm mass at the 1 o'clock position with 6 smaller adjacent masses extending toward the nipple ranging in size from 1.1cm to 4.0cm. [place] the left axilla showed five bulky lymph nodes, the largest measuring 4.2cm, and second largest measuring 2.8cm. 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 T1c N2 stage IIIc Based on PET CT scan showing bilateral cervical nodes, it is stage IV(however her goal is to cure given the oligometastatic nature of her cancer.)  01/10/2020: Bilateral mastectomies with reconstruction (Cornett & Pace): Right breast: no evidence of malignancy Left breast: no evidence of residual carcinoma and 4 benign lymph nodes.   Pathology review: Based upon complete pathologic response she had a phenomenal response to neoadjuvant chemotherapy.  Originally she had 5 bulky axillary lymph nodes.  There is suggest excellent prognosis.  Treatment plan: Pembrolizumab maintenance therapy x9 more cycles Immune mediated adverse effects: Hypothyroidism  Return to clinic every 3 weeks for pembrolizumab.

## 2020-01-23 DIAGNOSIS — Z9013 Acquired absence of bilateral breasts and nipples: Secondary | ICD-10-CM | POA: Insufficient documentation

## 2020-01-23 NOTE — Progress Notes (Signed)
Patient is a 39 yr-old female here for follow up after undergoing bilateral mastectomies with Dr. [name] and placement of tissue expander and flex HD with Dr. Claudia Desanctis on 01/10/20.  ~ 2 weeks PO Patient reports overall she is doing well.  Reports drain (left) output has been less than 15 cc for the last several days.  Bilateral incisions are healing well, C/D/I.  No signs of redness, drainage, seroma/hematoma.  Patient has some good skin laxity.  Reports no issues with her last fill and would like another today if possible. Denies F/C, N/V.  +BM. Drain was removed today.  We placed injectable saline in the Expander using a sterile technique: Right: 50 cc for a total of 450/ 550 cc Left: 50 cc for a total of 450/ 550 cc  Continue to wear sports bra 24/7. Continue to avoid heavy lifting. Follow up in 2 weeks for additional fill.  Call office with any questions/concerns.

## 2020-01-24 ENCOUNTER — Telehealth: Payer: Self-pay | Admitting: Hematology and Oncology

## 2020-01-24 ENCOUNTER — Encounter: Payer: Self-pay | Admitting: *Deleted

## 2020-01-24 NOTE — Telephone Encounter (Signed)
Scheduled per 10/7 los. Called and spoke with pt, confirmed 10/29 appts

## 2020-01-25 ENCOUNTER — Other Ambulatory Visit: Payer: Self-pay

## 2020-01-25 ENCOUNTER — Ambulatory Visit (INDEPENDENT_AMBULATORY_CARE_PROVIDER_SITE_OTHER): Payer: 59 | Admitting: Plastic Surgery

## 2020-01-25 ENCOUNTER — Encounter: Payer: Self-pay | Admitting: Plastic Surgery

## 2020-01-25 VITALS — BP 125/82 | HR 82 | Temp 97.9°F

## 2020-01-25 DIAGNOSIS — Z9013 Acquired absence of bilateral breasts and nipples: Secondary | ICD-10-CM

## 2020-02-02 ENCOUNTER — Ambulatory Visit: Payer: 59 | Attending: Surgery | Admitting: Physical Therapy

## 2020-02-02 ENCOUNTER — Other Ambulatory Visit: Payer: Self-pay

## 2020-02-02 ENCOUNTER — Encounter: Payer: Self-pay | Admitting: Physical Therapy

## 2020-02-02 DIAGNOSIS — Z483 Aftercare following surgery for neoplasm: Secondary | ICD-10-CM | POA: Insufficient documentation

## 2020-02-02 DIAGNOSIS — M25612 Stiffness of left shoulder, not elsewhere classified: Secondary | ICD-10-CM | POA: Diagnosis present

## 2020-02-02 DIAGNOSIS — C50812 Malignant neoplasm of overlapping sites of left female breast: Secondary | ICD-10-CM | POA: Diagnosis present

## 2020-02-02 DIAGNOSIS — Z171 Estrogen receptor negative status [ER-]: Secondary | ICD-10-CM | POA: Diagnosis present

## 2020-02-02 DIAGNOSIS — R293 Abnormal posture: Secondary | ICD-10-CM | POA: Diagnosis present
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
## 2020-02-06 ENCOUNTER — Other Ambulatory Visit: Payer: Self-pay | Admitting: Hematology and Oncology

## 2020-02-06 DIAGNOSIS — C50812 Malignant neoplasm of overlapping sites of left female breast: Secondary | ICD-10-CM

## 2020-02-06 DIAGNOSIS — Z171 Estrogen receptor negative status [ER-]: Secondary | ICD-10-CM

## 2020-02-06 NOTE — Progress Notes (Signed)
OFF PATHWAY REGIMEN - Breast  No Change  Continue With Treatment as Ordered.  Original Decision Date/Time: 04/27/2019 15:10   [id number]:RF-F (Doxorubicin + Cyclophosphamide q21 Days Followed by Paclitaxel Weekly q7 Days):   Cycles 1 through 4: A cycle is every 21 days:     Doxorubicin      Cyclophosphamide    Cycles 5 through 16: A cycle is every 7 days:     Paclitaxel   **Always confirm dose/schedule in your pharmacy ordering system**  [id number]:Pembrolizumab 200 mg q21 Days:   A cycle is 21 days:     Pembrolizumab   **Always confirm dose/schedule in your pharmacy ordering system**  Patient Characteristics: Preoperative or Nonsurgical Candidate (Clinical Staging), Neoadjuvant Therapy followed by Surgery, Invasive Disease, Chemotherapy, HER2 Negative/Unknown/Equivocal, ER Negative/Unknown, Platinum Therapy Indicated Therapeutic Status: Preoperative or Nonsurgical Candidate (Clinical Staging) AJCC M Category: cM0 AJCC Grade: G3 Breast Surgical Plan: Neoadjuvant Therapy followed by Surgery ER Status: Negative (-) AJCC 8 Stage Grouping: IIIC HER2 Status: Negative (-) AJCC T Category: cT1c AJCC N Category: cN2a PR Status: Negative (-) Type of Therapy: Platinum Therapy Indicated Intent of Therapy: Curative Intent, Discussed with Patient

## 2020-02-06 NOTE — Progress Notes (Signed)
Patient is a 39 year old female here for follow-up after undergoing bilateral mastectomies with Dr. [name] and placement of tissue expander and Flex HD with Dr. Claudia Desanctis on 01/10/2020.  ~ 4 weeks PO Patient presents today with her husband. Reports she is feeling well and has no concerns. Steri-Strips are in place bilaterally incisions are intact, clean and dry. No signs of infection, redness, drainage, seroma/hematoma. Denies fever/chills, nausea/vomiting. She reports she did well with her last fill and would like another today.  We placed injectable saline in the Expander using a sterile technique: Right: 60 cc for a total of 510 / 550 cc Left: 60 cc for a total of 510 / 550 cc  She can apply Vaseline over the Steri-Strips (this helps loosen the adhesive) and any breast tissue that is dry. She may shower normally. Continue to wear compression garment for two more weeks 24/7 and then just during the day. She may gradually begin to increase activity and lifting as tolerated. Follow-up in 2 weeks for additional fill. Call office with any questions/concerns.

## 2020-02-08 ENCOUNTER — Other Ambulatory Visit: Payer: Self-pay

## 2020-02-08 ENCOUNTER — Encounter: Payer: Self-pay | Admitting: Plastic Surgery

## 2020-02-08 ENCOUNTER — Ambulatory Visit (INDEPENDENT_AMBULATORY_CARE_PROVIDER_SITE_OTHER): Payer: 59 | Admitting: Plastic Surgery

## 2020-02-08 VITALS — BP 122/86 | HR 79 | Temp 98.4°F

## 2020-02-08 DIAGNOSIS — Z9013 Acquired absence of bilateral breasts and nipples: Secondary | ICD-10-CM

## 2020-02-08 NOTE — Progress Notes (Signed)
Patient here for a consult with Dr. Sondra Come.  Location of Breast Cancer: Left breast  Histology per Pathology Report: Multifocal invasive Ductal Carcinoma  Receptor Status: ER( - ), PR (-  ), Her2 (-  ) Ki67 90%  Did patient present with symptoms: Had palpable left breast mass for 3 months in 12/20.  Past interventions by surgeon, if any: Bilateral mastectomies 01/10/2020  Past/Anticipated interventions by medical oncology, if any: Chemo finished. Starts Immunotherapy tomorrow.  Lymphedema issues, if any: no  Pain issues, if any: no  SAFETY ISSUES: Prior radiation? no Pacemaker/ICD? no Possible current pregnancy? On oral contraceptives Is the patient on methotrexate? no  Current Complaints / other details:  Starts PT tomorrow. Accompanied by her husband.  BP (!) 137/99 (BP Location: Right Arm, Patient Position: Sitting)   Pulse 79   Temp (!) 97 F (36.1 C) (Temporal)   Resp 18   Ht '5\' 3"'  (1.6 m)   Wt 209 lb 2 oz (94.9 kg)   SpO2 100%   BMI 37.04 kg/m    Wt Readings from Last 3 Encounters:  02/09/20 209 lb 2 oz (94.9 kg)  01/19/20 206 lb 9.6 oz (93.7 kg)  01/10/20 214 lb (97.1 kg)       De Burrs, RN 02/08/2020,2:08 PM

## 2020-02-09 ENCOUNTER — Ambulatory Visit: Payer: 59 | Admitting: Radiation Oncology

## 2020-02-09 ENCOUNTER — Ambulatory Visit
Admission: RE | Admit: 2020-02-09 | Discharge: 2020-02-09 | Disposition: A | Discharge status: Home / Self Care | Payer: 59 | Source: Ambulatory Visit | Attending: Radiation Oncology | Admitting: Radiation Oncology

## 2020-02-09 ENCOUNTER — Other Ambulatory Visit: Payer: Self-pay

## 2020-02-09 ENCOUNTER — Encounter: Payer: Self-pay | Admitting: Radiation Oncology

## 2020-02-09 VITALS — BP 137/99 | HR 79 | Temp 97.0°F | Resp 18 | Ht 63.0 in | Wt 209.1 lb

## 2020-02-09 DIAGNOSIS — Z9013 Acquired absence of bilateral breasts and nipples: Secondary | ICD-10-CM | POA: Insufficient documentation

## 2020-02-09 DIAGNOSIS — C50612 Malignant neoplasm of axillary tail of left female breast: Secondary | ICD-10-CM

## 2020-02-09 DIAGNOSIS — Z171 Estrogen receptor negative status [ER-]: Secondary | ICD-10-CM

## 2020-02-09 DIAGNOSIS — C50812 Malignant neoplasm of overlapping sites of left female breast: Secondary | ICD-10-CM | POA: Insufficient documentation

## 2020-02-09 DIAGNOSIS — Z79899 Other long term (current) drug therapy: Secondary | ICD-10-CM | POA: Diagnosis not present

## 2020-02-09 DIAGNOSIS — Z9221 Personal history of antineoplastic chemotherapy: Secondary | ICD-10-CM | POA: Diagnosis not present

## 2020-02-09 NOTE — Progress Notes (Signed)
Radiation Oncology         [phone]) [phone] ________________________________  Name: Heather Mckenzie MRN: [id number]  Date: 02/09/2020  DOB: [birth year]  Re-Evaluation Note  CC: Patient, No Pcp Per  [name], MD    ICD-10-CM   1. Malignant neoplasm of overlapping sites of left breast in female, estrogen receptor negative ([facility])  C50.812    Z17.1   2. Malignant neoplasm of axillary tail of left female breast, unspecified estrogen receptor status ([place])  C50.612     Diagnosis: Stage IIIC Left Breast, Multifocal Invasive Ductal Carcinoma, ER- / PR- / Her2-, Grade 3  Narrative:  The patient returns today to discuss radiation treatment options. She was seen in the multidisciplinary breast clinic on 04/27/2019. At that time, it was recommended that she proceed with genetic testing, MRI, CT/bone scan, echocardiogram, neoadjuvant chemotherapy, surgery, and adjuvant radiation therapy.  Since consultation, she underwent genetic testing on 04/27/2019. Results showed negative.  Echocardiogram on 05/09/2019 showed an EF of 65-70%. Bone scan on that same day showed symmetric bilateral lower rib activity that may be due to soft tissue attenuation. It also showed a subtle focus of sternal activity in the inferior sternum that was suspicious for a small metastatic focus. Finally, the mildly increased uptake at the sternal manubrial junction that may have represented degenerative changes.  CT scan of chest/abdomen/pelvis on 05/10/2019 showed signs of left breast, axillary, and subpectoral/infraclavicular nodal involvement. There was also noted to be a cystic lesion in the left adnexa that measured 4.2 x 3.2 cm with suggestion of mural nodularity along the lateral wall. Additionally, there was a subtle hypodensity near the fissure for false form ligament that likely represented a pseudo lesion but with slightly rounded borders, which was somewhat atypical. There were no signs of a discrete sternal lesion that  was suggested on the above bone scan.   MRI of bilateral breasts on 05/13/2019 showed the biopsy-proven malignancy in the upper outer and upper inner quadrants of the left breast. There were also numerous suspicious enhancing masses scattered throughout the superior and lower outer quadrants, highly suggestive of multifocal, multicentric disease. The suspicious findings spanned greater than 10 cm in all three dimensions. Additionally, there were over twelve morphologically abnormal level 1, 2, and 3 left axillary lymphadenopathy. There was no MRI evidence of malignancy within the limitation of moderate background parenchymal enhancement along the right breast.   She began neoadjuvant chemotherapy on 05/12/2019 with four cycles of Adriamycin and Cytoxan with Pembrolizumab followed by twelve cycles of Taxol, Carboplatin, and Pembrolizumab under the care of Dr. Lindi Adie. She tolerated treatment relatively well with the exception of mild nausea, oral thrush that resolved, hypothyroidism, and neutropenia.  PET scan on 05/27/2019 showed hypermetabolic nodules in the left breast with hypermetabolic left axillary and subpectoral adenopathy. There were also small, but hypermetabolic, bilateral lymph nodes in the neck that were likely reflective of malignant involvement. Additionally, there was widespread and somewhat heterogeneous marrow activity, query granulocyte stimulation. There were no definite focal bony lesion, although sensitivity for small lesions was reduced due to the background activity. There were no significant bony abnormalities on the CT data. Finally, there was diffuse thyroid activity that is most commonly encountered in the setting of thryoiditis. There were no findings of metastatic disease in the abdomen or pelvis.  Ultrasound of left breast on 08/02/2019 showed an overall decrease in size and bulk of the masses previously imagined by ultrasound. However, there was a mild increase in size in two  planes of the previously biopsied mass at the 1 o'clock position, 6 cm from the nipple, although it could be artificial as all of the lesions had become more easily visible following treatment. The mass had clearly decreased in thickness from the prior study. The biopsy proven metastatic lymph nodes in the left axilla had returned to normal size.  The patient underwent a left modified mastectomy with a right risk-reducing simple mastectomy (Dr. [name]) in addition to a bilateral breast reconstruction with tissue expanders and acellular dermal matrix (Dr. Claudia Desanctis) on 01/10/2020. Pathology from the procedure revealed benign breast parenchyma with no specific histopathologic changes or carcinoma of either breast. There was a complete therapeutic response in the left breast. Four lymph nodes showed focal therapy-related changes without evidence of malignancy. Bilateral mastectomy skin flaps showed skin with underlying connective tissue but no carcinoma.  The patient was last seen by Dr. Lindi Adie on 01/19/2020, during which time it was recommended that she proceed with nine more cycles of Pembrolizumab treatment.   On review of systems, the patient reports no complaints. She denies lymphedema, breast/chest pain, and any other symptoms.    Allergies:  has No Known Allergies.  Meds: Current Outpatient Medications  Medication Sig Dispense Refill  . levonorgestrel-ethinyl estradiol (AMETHYST) 90-20 MCG tablet Take 1 tablet by mouth daily. (28) 90 mcg-20 mcg tablet    . levothyroxine (SYNTHROID) 88 MCG tablet Take 1 tablet (88 mcg total) by mouth daily before breakfast. 30 tablet 3   No current facility-administered medications for this encounter.    Physical Findings: The patient is in no acute distress. Patient is alert and oriented.  height is '5\' 3"'  (1.6 m) and weight is 209 lb 2 oz (94.9 kg). Her temporal temperature is 97 F (36.1 C) (abnormal). Her blood pressure is 137/99 (abnormal) and her pulse is  79. Her respiration is 18 and oxygen saturation is 100%.  No significant changes. Lungs are clear to auscultation bilaterally. Heart has regular rate and rhythm. No palpable cervical, supraclavicular, or axillary adenopathy. Abdomen soft, non-tender, normal bowel sounds. Right chest: Reconstructed right breast.  No signs of infection or drainage. Left  chest: Reconstructed left breast no signs of infection or drainage.  Lab Findings: Lab Results  Component Value Date   WBC 6.8 02/10/2020   HGB 10.9 (L) 02/10/2020   HCT 32.8 (L) 02/10/2020   MCV 94.8 02/10/2020   PLT 246 02/10/2020    Radiographic Findings: No results found.  Impression: Stage IIIC Left Breast, Multifocal Invasive Ductal Carcinoma, ER- / PR- / Her2-, Grade 3  Patient would be a good candidate for postmastectomy radiation therapy along the left reconstructed breast region.  Prior to initiation of neoadjuvant chemotherapy the patient was found to have extensive disease within the left breast and multiple abnormal appearing lymph nodes in the axillary region.  I would recommend coverage along the left chest wall as well as the axillary region given her initial presentation.  I discussed the course of treatment, side effects and potential long-term toxicities of radiation therapy in this situation with the patient.  She appears to understand and wishes to proceed with planned course of treatment  Plan:  Patient was scheduled for CT simulation later today, however she is not quite ready to proceed with simulation given her left shoulder mobility.  This is been scheduled for November 8 at 11 AM. anticipate 5 to 6 weeks of postmastectomy radiation therapy along the left side.  Total time spent in this encounter was 35  minutes which included reviewing the patient's most recent genetic testing, MRI of breasts, PET scan, CT of chest.abdomen/pelvis, left breast ultrasound, follow-ups, mastectomies, pathology report, chemotherapy,  physical examination, and documentation.  -----------------------------------  Blair Promise, PhD, MD  This document serves as a record of services personally performed by [name], MD. It was created on his behalf by Clerance Lav, a trained medical scribe. The creation of this record is based on the scribe's personal observations and the provider's statements to them. This document has been checked and approved by the attending provider.

## 2020-02-10 ENCOUNTER — Other Ambulatory Visit: Payer: Self-pay

## 2020-02-10 ENCOUNTER — Inpatient Hospital Stay: Payer: 59

## 2020-02-10 ENCOUNTER — Encounter: Payer: Self-pay | Admitting: Licensed Clinical Social Worker

## 2020-02-10 VITALS — BP 126/90 | HR 70 | Temp 98.5°F | Resp 18

## 2020-02-10 DIAGNOSIS — C50812 Malignant neoplasm of overlapping sites of left female breast: Secondary | ICD-10-CM

## 2020-02-10 DIAGNOSIS — Z95828 Presence of other vascular implants and grafts: Secondary | ICD-10-CM

## 2020-02-10 DIAGNOSIS — Z171 Estrogen receptor negative status [ER-]: Secondary | ICD-10-CM

## 2020-02-10 DIAGNOSIS — Z5112 Encounter for antineoplastic immunotherapy: Secondary | ICD-10-CM | POA: Diagnosis not present

## 2020-02-10 LAB — CBC WITH DIFFERENTIAL (CANCER CENTER ONLY)
Abs Immature Granulocytes: 0.02 10*3/uL (ref 0.00–0.07)
Basophils Absolute: 0.1 10*3/uL (ref 0.0–0.1)
Basophils Relative: 1 %
Eosinophils Absolute: 0.3 10*3/uL (ref 0.0–0.5)
Eosinophils Relative: 4 %
HCT: 32.8 % — ABNORMAL LOW (ref 36.0–46.0)
Hemoglobin: 10.9 g/dL — ABNORMAL LOW (ref 12.0–15.0)
Immature Granulocytes: 0 %
Lymphocytes Relative: 32 %
Lymphs Abs: 2.2 10*3/uL (ref 0.7–4.0)
MCH: 31.5 pg (ref 26.0–34.0)
MCHC: 33.2 g/dL (ref 30.0–36.0)
MCV: 94.8 fL (ref 80.0–100.0)
Monocytes Absolute: 0.4 10*3/uL (ref 0.1–1.0)
Monocytes Relative: 6 %
Neutro Abs: 3.8 10*3/uL (ref 1.7–7.7)
Neutrophils Relative %: 57 %
Platelet Count: 246 10*3/uL (ref 150–400)
RBC: 3.46 MIL/uL — ABNORMAL LOW (ref 3.87–5.11)
RDW: 12.3 % (ref 11.5–15.5)
WBC Count: 6.8 10*3/uL (ref 4.0–10.5)
nRBC: 0 % (ref 0.0–0.2)

## 2020-02-10 LAB — CMP (CANCER CENTER ONLY)
ALT: 11 U/L (ref 0–44)
AST: 16 U/L (ref 15–41)
Albumin: 3.6 g/dL (ref 3.5–5.0)
Alkaline Phosphatase: 83 U/L (ref 38–126)
Anion gap: 11 (ref 5–15)
BUN: 16 mg/dL (ref 6–20)
CO2: 24 mmol/L (ref 22–32)
Calcium: 9.3 mg/dL (ref 8.9–10.3)
Chloride: 106 mmol/L (ref 98–111)
Creatinine: 0.94 mg/dL (ref 0.44–1.00)
GFR, Estimated: >60 mL/min (ref >60)
Glucose, Bld: 92 mg/dL (ref 70–99)
Potassium: 3.7 mmol/L (ref 3.5–5.1)
Sodium: 141 mmol/L (ref 135–145)
Total Bilirubin: 0.3 mg/dL (ref 0.3–1.2)
Total Protein: 7.1 g/dL (ref 6.5–8.1)

## 2020-02-10 LAB — TSH: TSH: 48.781 u[IU]/mL — ABNORMAL HIGH (ref 0.308–3.960)

## 2020-02-10 MED ORDER — SODIUM CHLORIDE 0.9 % IV SOLN
200.0000 mg | Freq: Once | INTRAVENOUS | Status: AC
  Administered 2020-02-10: 200 mg via INTRAVENOUS
  Filled 2020-02-10: qty 8

## 2020-02-10 MED ORDER — SODIUM CHLORIDE 0.9% FLUSH
10.0000 mL | INTRAVENOUS | Status: DC | PRN
  Administered 2020-02-10: 10 mL
  Filled 2020-02-10: qty 10

## 2020-02-10 MED ORDER — SODIUM CHLORIDE 0.9 % IV SOLN
Freq: Once | INTRAVENOUS | Status: AC
  Filled 2020-02-10: qty 250

## 2020-02-10 MED ORDER — HEPARIN SOD (PORK) LOCK FLUSH 100 UNIT/ML IV SOLN
500.0000 [IU] | Freq: Once | INTRAVENOUS | Status: AC | PRN
  Administered 2020-02-10: 500 [IU]
  Filled 2020-02-10: qty 5

## 2020-02-10 NOTE — Patient Instructions (Signed)
[facility] Discharge Instructions for Patients Receiving Chemotherapy  Today you received the following chemotherapy agents keytruda  To help prevent nausea and vomiting after your treatment, we encourage you to take your nausea medication as directed If you develop nausea and vomiting that is not controlled by your nausea medication, call the clinic.   BELOW ARE SYMPTOMS THAT SHOULD BE REPORTED IMMEDIATELY:  *FEVER GREATER THAN 100.5 F  *CHILLS WITH OR WITHOUT FEVER  NAUSEA AND VOMITING THAT IS NOT CONTROLLED WITH YOUR NAUSEA MEDICATION  *UNUSUAL SHORTNESS OF BREATH  *UNUSUAL BRUISING OR BLEEDING  TENDERNESS IN MOUTH AND THROAT WITH OR WITHOUT PRESENCE OF ULCERS  *URINARY PROBLEMS  *BOWEL PROBLEMS  UNUSUAL RASH Items with * indicate a potential emergency and should be followed up as soon as possible.  Feel free to call the clinic should you have any questions or concerns. The clinic phone number is [phone].  Please show the CHEMO ALERT CARD at check-in to the Emergency Department and triage nurse.   

## 2020-02-10 NOTE — Progress Notes (Signed)
[facility] Psychosocial Distress Screening Clinical Social Work  Clinical Social Work was referred by distress screening protocol.  The patient scored a 7 on the Psychosocial Distress Thermometer which indicates moderate distress. Patient declined referral to social work. CSW may be consulted in the future as needed.   ONCBCN DISTRESS SCREENING 02/09/2020 05/04/2019  Screening Type Initial Screening Initial Screening  Distress experienced in past week (1-10) 7 0  Referral to clinical social work No   Referral to support programs  Yes    Clinical Social Worker follow up needed: No.  If yes, follow up plan:  Katiria Calame, [place], LCSW

## 2020-02-11 LAB — T4: T4, Total: 1.2 ug/dL — ABNORMAL LOW (ref 4.5–12.0)

## 2020-02-13 ENCOUNTER — Other Ambulatory Visit: Payer: Self-pay

## 2020-02-13 ENCOUNTER — Ambulatory Visit: Payer: 59

## 2020-02-13 DIAGNOSIS — D6481 Anemia due to antineoplastic chemotherapy: Secondary | ICD-10-CM | POA: Diagnosis not present

## 2020-02-13 DIAGNOSIS — Z171 Estrogen receptor negative status [ER-]: Secondary | ICD-10-CM

## 2020-02-13 DIAGNOSIS — Z483 Aftercare following surgery for neoplasm: Secondary | ICD-10-CM | POA: Insufficient documentation

## 2020-02-13 DIAGNOSIS — R293 Abnormal posture: Secondary | ICD-10-CM | POA: Diagnosis not present

## 2020-02-13 DIAGNOSIS — C50812 Malignant neoplasm of overlapping sites of left female breast: Secondary | ICD-10-CM | POA: Insufficient documentation

## 2020-02-13 DIAGNOSIS — M25612 Stiffness of left shoulder, not elsewhere classified: Secondary | ICD-10-CM

## 2020-02-13 DIAGNOSIS — Z79899 Other long term (current) drug therapy: Secondary | ICD-10-CM | POA: Diagnosis not present

## 2020-02-13 DIAGNOSIS — Z5112 Encounter for antineoplastic immunotherapy: Secondary | ICD-10-CM | POA: Diagnosis not present
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
## 2020-02-15 ENCOUNTER — Encounter: Payer: Self-pay | Admitting: *Deleted

## 2020-02-16 ENCOUNTER — Ambulatory Visit: Payer: 59

## 2020-02-16 ENCOUNTER — Other Ambulatory Visit: Payer: Self-pay

## 2020-02-16 DIAGNOSIS — Z5112 Encounter for antineoplastic immunotherapy: Secondary | ICD-10-CM | POA: Diagnosis not present

## 2020-02-16 DIAGNOSIS — R293 Abnormal posture: Secondary | ICD-10-CM

## 2020-02-16 DIAGNOSIS — M25612 Stiffness of left shoulder, not elsewhere classified: Secondary | ICD-10-CM

## 2020-02-16 DIAGNOSIS — Z483 Aftercare following surgery for neoplasm: Secondary | ICD-10-CM

## 2020-02-16 DIAGNOSIS — C50812 Malignant neoplasm of overlapping sites of left female breast: Secondary | ICD-10-CM

## 2020-02-16 DIAGNOSIS — Z171 Estrogen receptor negative status [ER-]: Secondary | ICD-10-CM
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
## 2020-02-19 NOTE — Progress Notes (Signed)
Patient is a 39 year old female here for follow-up after undergoing bilateral mastectomies with Dr. [name] and placement of tissue expanders and Flex HD with Dr. Claudia Desanctis on 01/10/2020.  ~ 6 weeks PO Patient reports she is doing very well.  No complaints.  She tolerated her last fill well and would like an additional fill today.  Denies fever/chills, nausea/vomiting.  Bilateral incisions are healing very nicely, C/D/I.  No signs of infection, redness, drainage, seroma/hematoma.  She no longer needs to wear compression in the evening.  Recommend wearing it during the day when possible.  May resume normal activities as tolerated.  May shower normally.  She will start radiation on Monday and receive it daily for a total of 6 weeks ending on January 4.  Follow-up in 2 weeks for additional fill.  Call office with any questions/concerns.  We placed injectable saline in the Expander using a sterile technique: Right: 50 cc for a total of 560 / 550 cc Left: 50 cc for a total of 560 / 550 cc

## 2020-02-20 ENCOUNTER — Ambulatory Visit: Payer: 59

## 2020-02-20 ENCOUNTER — Other Ambulatory Visit: Payer: Self-pay

## 2020-02-20 ENCOUNTER — Ambulatory Visit
Admission: RE | Admit: 2020-02-20 | Discharge: 2020-02-20 | Disposition: A | Discharge status: Home / Self Care | Payer: 59 | Source: Ambulatory Visit | Attending: Radiation Oncology | Admitting: Radiation Oncology

## 2020-02-20 DIAGNOSIS — Z171 Estrogen receptor negative status [ER-]: Secondary | ICD-10-CM

## 2020-02-20 DIAGNOSIS — Z5112 Encounter for antineoplastic immunotherapy: Secondary | ICD-10-CM | POA: Diagnosis not present

## 2020-02-20 DIAGNOSIS — C50919 Malignant neoplasm of unspecified site of unspecified female breast: Secondary | ICD-10-CM | POA: Insufficient documentation

## 2020-02-20 DIAGNOSIS — R293 Abnormal posture: Secondary | ICD-10-CM

## 2020-02-20 DIAGNOSIS — M25612 Stiffness of left shoulder, not elsewhere classified: Secondary | ICD-10-CM

## 2020-02-20 DIAGNOSIS — C50812 Malignant neoplasm of overlapping sites of left female breast: Secondary | ICD-10-CM

## 2020-02-20 DIAGNOSIS — Z483 Aftercare following surgery for neoplasm: Secondary | ICD-10-CM
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
## 2020-02-21 ENCOUNTER — Encounter: Payer: Self-pay | Admitting: *Deleted

## 2020-02-23 ENCOUNTER — Other Ambulatory Visit: Payer: Self-pay

## 2020-02-23 ENCOUNTER — Ambulatory Visit (INDEPENDENT_AMBULATORY_CARE_PROVIDER_SITE_OTHER): Payer: 59 | Admitting: Plastic Surgery

## 2020-02-23 ENCOUNTER — Encounter: Payer: Self-pay | Admitting: Plastic Surgery

## 2020-02-23 ENCOUNTER — Ambulatory Visit: Payer: 59

## 2020-02-23 VITALS — BP 111/76 | HR 97 | Temp 98.1°F

## 2020-02-23 DIAGNOSIS — C50812 Malignant neoplasm of overlapping sites of left female breast: Secondary | ICD-10-CM

## 2020-02-23 DIAGNOSIS — M25612 Stiffness of left shoulder, not elsewhere classified: Secondary | ICD-10-CM

## 2020-02-23 DIAGNOSIS — R293 Abnormal posture: Secondary | ICD-10-CM

## 2020-02-23 DIAGNOSIS — Z171 Estrogen receptor negative status [ER-]: Secondary | ICD-10-CM

## 2020-02-23 DIAGNOSIS — Z483 Aftercare following surgery for neoplasm: Secondary | ICD-10-CM

## 2020-02-23 DIAGNOSIS — Z9013 Acquired absence of bilateral breasts and nipples: Secondary | ICD-10-CM

## 2020-02-23 DIAGNOSIS — Z5112 Encounter for antineoplastic immunotherapy: Secondary | ICD-10-CM | POA: Diagnosis not present
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
## 2020-02-23 NOTE — Patient Instructions (Signed)
Access Code: [email or web address] URL: [email or web address] Date: 02/23/2020 Prepared by: Cheral Almas  Exercises Standing Shoulder Row with Anchored Resistance - 1 x daily - 7 x weekly - 3 sets - 10 reps Scapular Retraction with Resistance Advanced - 1 x daily - 7 x weekly - 3 sets - 10 reps

## 2020-02-26 DIAGNOSIS — Z5112 Encounter for antineoplastic immunotherapy: Secondary | ICD-10-CM | POA: Diagnosis not present

## 2020-02-27 ENCOUNTER — Other Ambulatory Visit: Payer: Self-pay

## 2020-02-27 ENCOUNTER — Ambulatory Visit
Admission: RE | Admit: 2020-02-27 | Discharge: 2020-02-27 | Disposition: A | Discharge status: Home / Self Care | Payer: 59 | Source: Ambulatory Visit | Attending: Radiation Oncology | Admitting: Radiation Oncology

## 2020-02-27 DIAGNOSIS — Z171 Estrogen receptor negative status [ER-]: Secondary | ICD-10-CM

## 2020-02-27 DIAGNOSIS — C50812 Malignant neoplasm of overlapping sites of left female breast: Secondary | ICD-10-CM

## 2020-02-27 DIAGNOSIS — Z5112 Encounter for antineoplastic immunotherapy: Secondary | ICD-10-CM | POA: Diagnosis not present

## 2020-02-28 ENCOUNTER — Ambulatory Visit
Admission: RE | Admit: 2020-02-28 | Discharge: 2020-02-28 | Disposition: A | Discharge status: Home / Self Care | Payer: 59 | Source: Ambulatory Visit | Attending: Radiation Oncology | Admitting: Radiation Oncology

## 2020-02-28 ENCOUNTER — Other Ambulatory Visit: Payer: Self-pay

## 2020-02-28 DIAGNOSIS — Z5112 Encounter for antineoplastic immunotherapy: Secondary | ICD-10-CM | POA: Diagnosis not present

## 2020-02-29 ENCOUNTER — Other Ambulatory Visit: Payer: Self-pay

## 2020-02-29 ENCOUNTER — Ambulatory Visit
Admission: RE | Admit: 2020-02-29 | Discharge: 2020-02-29 | Disposition: A | Discharge status: Home / Self Care | Payer: 59 | Source: Ambulatory Visit | Attending: Radiation Oncology | Admitting: Radiation Oncology

## 2020-02-29 DIAGNOSIS — Z5112 Encounter for antineoplastic immunotherapy: Secondary | ICD-10-CM | POA: Diagnosis not present

## 2020-03-01 ENCOUNTER — Ambulatory Visit: Payer: 59

## 2020-03-01 ENCOUNTER — Ambulatory Visit
Admission: RE | Admit: 2020-03-01 | Discharge: 2020-03-01 | Disposition: A | Discharge status: Home / Self Care | Payer: 59 | Source: Ambulatory Visit | Attending: Radiation Oncology | Admitting: Radiation Oncology

## 2020-03-01 DIAGNOSIS — Z483 Aftercare following surgery for neoplasm: Secondary | ICD-10-CM

## 2020-03-01 DIAGNOSIS — Z171 Estrogen receptor negative status [ER-]: Secondary | ICD-10-CM

## 2020-03-01 DIAGNOSIS — R293 Abnormal posture: Secondary | ICD-10-CM

## 2020-03-01 DIAGNOSIS — M25612 Stiffness of left shoulder, not elsewhere classified: Secondary | ICD-10-CM

## 2020-03-01 DIAGNOSIS — Z5112 Encounter for antineoplastic immunotherapy: Secondary | ICD-10-CM | POA: Diagnosis not present
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
## 2020-03-01 NOTE — Progress Notes (Signed)
 Patient Care Team: Patient, No Pcp Per as PCP - General (General Practice) Martini, Keisha N, RN as Oncology Nurse Navigator Stuart, Dawn C, RN as Oncology Nurse Navigator Cornett, Thomas, MD as Consulting Physician (General Surgery) Gudena, Vinay, MD as Consulting Physician (Hematology and Oncology) Kinard, James, MD as Consulting Physician (Radiation Oncology)  DIAGNOSIS:    ICD-10-CM   1. Malignant neoplasm of overlapping sites of left breast in female, estrogen receptor negative (HCC)  C50.812    Z17.1     SUMMARY OF ONCOLOGIC HISTORY: Oncology History  Malignant neoplasm of overlapping sites of left breast in female, estrogen receptor negative (HCC)  04/25/2019 Initial Diagnosis   Patient palpated a left breast and axillary mass x3months. US of the left breast showed a 3.6cm mass at the 1 o'clock position with 6 smaller adjacent masses extending toward the nipple ranging in size from 1.1cm to 4.0cm. US of the left axilla showed five bulky lymph nodes, the largest measuring 4.2cm, and second largest measuring 2.8cm. 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    04/27/2019 Cancer Staging   Staging form: Breast, AJCC 8th Edition - Clinical: Stage IIIC (cT1c, cN2a, cM0, G3, ER-, PR-, HER2-) - Signed by Gudena, Vinay, MD on 04/27/2019   05/12/2019 - 10/27/2019 Chemotherapy   The patient had DOXOrubicin (ADRIAMYCIN) chemo injection 128 mg, 60 mg/m2 = 128 mg, Intravenous,  Once, 4 of 4 cycles Administration: 128 mg (05/12/2019), 128 mg (06/03/2019), 128 mg (06/23/2019), 128 mg (07/14/2019) palonosetron (ALOXI) injection 0.25 mg, 0.25 mg, Intravenous,  Once, 8 of 8 cycles Administration: 0.25 mg (05/12/2019), 0.25 mg (08/04/2019), 0.25 mg (06/03/2019), 0.25 mg (09/01/2019), 0.25 mg (06/23/2019), 0.25 mg (07/14/2019), 0.25 mg (09/22/2019), 0.25 mg (10/13/2019) pegfilgrastim-cbqv (UDENYCA) injection 6 mg, 6 mg, Subcutaneous, Once, 4 of 4 cycles Administration: 6 mg  (05/14/2019), 6 mg (06/06/2019), 6 mg (06/25/2019), 6 mg (07/16/2019) CARBOplatin (PARAPLATIN) 700 mg in sodium chloride 0.9 % 250 mL chemo infusion, 700 mg (100 % of original dose 700 mg), Intravenous,  Once, 4 of 4 cycles Dose modification: 700 mg (original dose 700 mg, Cycle 5) Administration: 700 mg (08/04/2019), 700 mg (09/01/2019), 700 mg (09/22/2019), 700 mg (10/13/2019) cyclophosphamide (CYTOXAN) 1,280 mg in sodium chloride 0.9 % 250 mL chemo infusion, 600 mg/m2 = 1,280 mg, Intravenous,  Once, 4 of 4 cycles Administration: 1,280 mg (05/12/2019), 1,280 mg (06/03/2019), 1,280 mg (06/23/2019), 1,280 mg (07/14/2019) PACLitaxel (TAXOL) 174 mg in sodium chloride 0.9 % 250 mL chemo infusion (</= 80mg/m2), 80 mg/m2 = 174 mg, Intravenous,  Once, 4 of 4 cycles Dose modification: 65 mg/m2 (original dose 80 mg/m2, Cycle 6, Reason: Dose not tolerated) Administration: 174 mg (08/04/2019), 174 mg (08/11/2019), 138 mg (09/01/2019), 138 mg (08/25/2019), 138 mg (09/08/2019), 138 mg (09/15/2019), 138 mg (09/22/2019), 138 mg (09/29/2019), 138 mg (10/07/2019), 138 mg (10/13/2019), 138 mg (10/21/2019), 138 mg (10/27/2019) fosaprepitant (EMEND) 150 mg in sodium chloride 0.9 % 145 mL IVPB, 150 mg, Intravenous,  Once, 8 of 8 cycles Administration: 150 mg (05/12/2019), 150 mg (08/04/2019), 150 mg (06/03/2019), 150 mg (09/01/2019), 150 mg (06/23/2019), 150 mg (07/14/2019), 150 mg (09/22/2019), 150 mg (10/13/2019) pembrolizumab (KEYTRUDA) 200 mg in sodium chloride 0.9 % 50 mL chemo infusion, 2 mg/kg = 200 mg, Intravenous, Once, 8 of 8 cycles Administration: 200 mg (05/12/2019), 200 mg (06/03/2019), 200 mg (06/23/2019), 200 mg (07/14/2019), 200 mg (08/04/2019), 200 mg (09/01/2019), 200 mg (09/22/2019), 200 mg (10/13/2019)  for chemotherapy treatment.       Genetic Testing   Negative genetic testing. No pathogenic variants identified. VUS in HOXB13 called c.473C>A, VUS in POLD1 called c.1610G>C, and VUS in RAD50 called c.1094G>A identified on the [facility] Common Hereditary  Cancers Panel. The report date is 05/10/2019.  The Common Hereditary Cancers Panel offered by [facility] includes sequencing and/or deletion duplication testing of the following 48 genes: APC, ATM, AXIN2, BARD1, BMPR1A, BRCA1, BRCA2, BRIP1, CDH1, CDKN2A (p14ARF), CDKN2A (p16INK4a), CKD4, CHEK2, CTNNA1, DICER1, EPCAM (Deletion/duplication testing only), GREM1 (promoter region deletion/duplication testing only), KIT, MEN1, MLH1, MSH2, MSH3, MSH6, MUTYH, NBN, NF1, NHTL1, PALB2, PDGFRA, PMS2, POLD1, POLE, PTEN, RAD50, RAD51C, RAD51D, RNF43, SDHB, SDHC, SDHD, SMAD4, SMARCA4. STK11, TP53, TSC1, TSC2, and VHL.  The following genes were evaluated for sequence changes only: SDHA and HOXB13 c.251G>A variant only.   01/10/2020 Surgery   Bilateral mastectomies with reconstruction (Cornett & Pace): Right breast: no evidence tof malignancy Left breast: no evidence of residual carcinoma and 4 benign lymph nodes.    02/10/2020 -  Chemotherapy   The patient had pembrolizumab (KEYTRUDA) 200 mg in sodium chloride 0.9 % 50 mL chemo infusion, 200 mg, Intravenous, Once, 1 of 5 cycles Administration: 200 mg (02/10/2020)  for chemotherapy treatment.    02/28/2020 -  Radiation Therapy   Adjuvant radiation     CHIEF COMPLIANT: Follow-up of triple negative left breast cancer  INTERVAL HISTORY: Heather Mckenzie is a 39 y.o. with above-mentioned history of triple negative left breast cancer who completed neoadjuvant chemotherapy, underwent bilateral mastectomies with reconstruction, and is currently undergoing radiation. She presents to the clinic todayto discuss further treatment.    ALLERGIES:  has No Known Allergies.  MEDICATIONS:  Current Outpatient Medications  Medication Sig Dispense Refill  . levonorgestrel-ethinyl estradiol (AMETHYST) 90-20 MCG tablet Take 1 tablet by mouth daily. (28) 90 mcg-20 mcg tablet    . levothyroxine (SYNTHROID) 88 MCG tablet Take 1 tablet (88 mcg total) by mouth daily before breakfast. 30  tablet 3   No current facility-administered medications for this visit.    PHYSICAL EXAMINATION: ECOG PERFORMANCE STATUS: 1 - Symptomatic but completely ambulatory  Vitals:   03/02/20 1030  BP: (!) 123/91  Pulse: 80  Resp: 18  Temp: 99.1 F (37.3 C)  SpO2: 99%   Filed Weights   03/02/20 1030  Weight: 216 lb (98 kg)    LABORATORY DATA:  I have reviewed the data as listed CMP Latest Ref Rng & Units 02/10/2020 01/04/2020 10/27/2019  Glucose 70 - 99 mg/dL 92 96 85  BUN 6 - 20 mg/dL 16 11 15  Creatinine 0.44 - 1.00 mg/dL 0.94 0.85 0.78  Sodium 135 - 145 mmol/L 141 138 139  Potassium 3.5 - 5.1 mmol/L 3.7 3.7 3.8  Chloride 98 - 111 mmol/L 106 102 104  CO2 22 - 32 mmol/L 24 26 25  Calcium 8.9 - 10.3 mg/dL 9.3 9.5 9.4  Total Protein 6.5 - 8.1 g/dL 7.1 7.6 7.2  Total Bilirubin 0.3 - 1.2 mg/dL 0.3 0.7 0.2(L)  Alkaline Phos 38 - 126 U/L 83 70 86  AST 15 - 41 U/L 16 24 20  ALT 0 - 44 U/L 11 17 18    Lab Results  Component Value Date   WBC 6.2 03/02/2020   HGB 11.8 (L) 03/02/2020   HCT 34.8 (L) 03/02/2020   MCV 91.1 03/02/2020   PLT 290 03/02/2020   NEUTROABS 3.6 03/02/2020    ASSESSMENT & PLAN:  Malignant neoplasm of overlapping sites of left breast in female, estrogen   receptor negative ([place]) 04/25/2019:Patient palpated a left breast and axillary mass x57month. [place] the left breast showed a 3.6cm mass at the 1 o'clock position with 6 smaller adjacent masses extending toward the nipple ranging in size from 1.1cm to 4.0cm. [place] the left axilla showed five bulky lymph nodes, the largest measuring 4.2cm, and second largest measuring 2.8cm. 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 T1c N2 stage IIIc Based on PET CT scan showing bilateral cervical nodes, it is stage IV(however her goal is to cure given the oligometastatic nature of her cancer.)  01/10/2020: Bilateral mastectomies with reconstruction (Cornett & Pace): Right breast: no  evidence of malignancy Left breast: no evidence of residual carcinoma and 4 benign lymph nodes.   Treatment plan:  1.  Adjuvant radiation  2. adjuvant Pembrolizumab maintenance therapy x 8 more cycles Immune mediated adverse effects: Hypothyroidism, onSynthroid.  Chemo induced anemia: Hemoglobin today is 11.8  Return to clinic every 3 weeks for pembrolizumab.  No orders of the defined types were placed in this encounter.  The patient has a good understanding of the overall plan. she agrees with it. she will call with any problems that may develop before the next visit here.  Total time spent: 30 mins including face to face time and time spent for planning, charting and coordination of care  GNicholas Lose MD 03/02/2020  I, MCloyde ReamsDorshimer, am acting as scribe for Dr. VNicholas Lose  I have reviewed the above documentation for accuracy and completeness, and I agree with the above.

## 2020-03-02 ENCOUNTER — Other Ambulatory Visit: Payer: Self-pay

## 2020-03-02 ENCOUNTER — Inpatient Hospital Stay: Payer: 59

## 2020-03-02 ENCOUNTER — Ambulatory Visit
Admission: RE | Admit: 2020-03-02 | Discharge: 2020-03-02 | Disposition: A | Discharge status: Home / Self Care | Payer: 59 | Source: Ambulatory Visit | Attending: Radiation Oncology | Admitting: Radiation Oncology

## 2020-03-02 ENCOUNTER — Inpatient Hospital Stay: Payer: 59 | Attending: Hematology and Oncology | Admitting: Hematology and Oncology

## 2020-03-02 DIAGNOSIS — C50812 Malignant neoplasm of overlapping sites of left female breast: Secondary | ICD-10-CM | POA: Insufficient documentation

## 2020-03-02 DIAGNOSIS — Z79899 Other long term (current) drug therapy: Secondary | ICD-10-CM | POA: Insufficient documentation

## 2020-03-02 DIAGNOSIS — C50919 Malignant neoplasm of unspecified site of unspecified female breast: Secondary | ICD-10-CM

## 2020-03-02 DIAGNOSIS — Z171 Estrogen receptor negative status [ER-]: Secondary | ICD-10-CM

## 2020-03-02 DIAGNOSIS — R293 Abnormal posture: Secondary | ICD-10-CM | POA: Insufficient documentation

## 2020-03-02 DIAGNOSIS — M25612 Stiffness of left shoulder, not elsewhere classified: Secondary | ICD-10-CM | POA: Insufficient documentation

## 2020-03-02 DIAGNOSIS — Z5112 Encounter for antineoplastic immunotherapy: Secondary | ICD-10-CM | POA: Diagnosis not present

## 2020-03-02 DIAGNOSIS — D6481 Anemia due to antineoplastic chemotherapy: Secondary | ICD-10-CM | POA: Insufficient documentation

## 2020-03-02 LAB — CBC WITH DIFFERENTIAL (CANCER CENTER ONLY)
Abs Immature Granulocytes: 0.02 10*3/uL (ref 0.00–0.07)
Basophils Absolute: 0.1 10*3/uL (ref 0.0–0.1)
Basophils Relative: 1 %
Eosinophils Absolute: 0.2 10*3/uL (ref 0.0–0.5)
Eosinophils Relative: 4 %
HCT: 34.8 % — ABNORMAL LOW (ref 36.0–46.0)
Hemoglobin: 11.8 g/dL — ABNORMAL LOW (ref 12.0–15.0)
Immature Granulocytes: 0 %
Lymphocytes Relative: 30 %
Lymphs Abs: 1.9 10*3/uL (ref 0.7–4.0)
MCH: 30.9 pg (ref 26.0–34.0)
MCHC: 33.9 g/dL (ref 30.0–36.0)
MCV: 91.1 fL (ref 80.0–100.0)
Monocytes Absolute: 0.4 10*3/uL (ref 0.1–1.0)
Monocytes Relative: 6 %
Neutro Abs: 3.6 10*3/uL (ref 1.7–7.7)
Neutrophils Relative %: 59 %
Platelet Count: 290 10*3/uL (ref 150–400)
RBC: 3.82 MIL/uL — ABNORMAL LOW (ref 3.87–5.11)
RDW: 12.5 % (ref 11.5–15.5)
WBC Count: 6.2 10*3/uL (ref 4.0–10.5)
nRBC: 0 % (ref 0.0–0.2)

## 2020-03-02 LAB — CMP (CANCER CENTER ONLY)
ALT: 11 U/L (ref 0–44)
AST: 24 U/L (ref 15–41)
Albumin: 3.8 g/dL (ref 3.5–5.0)
Alkaline Phosphatase: 84 U/L (ref 38–126)
Anion gap: 12 (ref 5–15)
BUN: 16 mg/dL (ref 6–20)
CO2: 24 mmol/L (ref 22–32)
Calcium: 9.4 mg/dL (ref 8.9–10.3)
Chloride: 102 mmol/L (ref 98–111)
Creatinine: 0.94 mg/dL (ref 0.44–1.00)
GFR, Estimated: >60 mL/min (ref >60)
Glucose, Bld: 87 mg/dL (ref 70–99)
Potassium: 3.9 mmol/L (ref 3.5–5.1)
Sodium: 138 mmol/L (ref 135–145)
Total Bilirubin: 0.4 mg/dL (ref 0.3–1.2)
Total Protein: 7.6 g/dL (ref 6.5–8.1)

## 2020-03-02 MED ORDER — RADIAPLEXRX EX GEL: Freq: Once | CUTANEOUS | Status: AC

## 2020-03-02 MED ORDER — SODIUM CHLORIDE 0.9% FLUSH
10.0000 mL | INTRAVENOUS | Status: DC | PRN
  Administered 2020-03-02: 10 mL
  Filled 2020-03-02: qty 10

## 2020-03-02 MED ORDER — ALRA NON-METALLIC DEODORANT (RAD-ONC)
1.0000 "application " | Freq: Once | TOPICAL | Status: AC
  Administered 2020-03-02: 1 via TOPICAL

## 2020-03-02 MED ORDER — SODIUM CHLORIDE 0.9 % IV SOLN
Freq: Once | INTRAVENOUS | Status: AC
  Filled 2020-03-02: qty 250

## 2020-03-02 MED ORDER — SODIUM CHLORIDE 0.9 % IV SOLN
200.0000 mg | Freq: Once | INTRAVENOUS | Status: AC
  Administered 2020-03-02: 200 mg via INTRAVENOUS
  Filled 2020-03-02: qty 8

## 2020-03-02 MED ORDER — HEPARIN SOD (PORK) LOCK FLUSH 100 UNIT/ML IV SOLN
500.0000 [IU] | Freq: Once | INTRAVENOUS | Status: AC | PRN
  Administered 2020-03-02: 500 [IU]
  Filled 2020-03-02: qty 5

## 2020-03-02 NOTE — Patient Instructions (Signed)
[facility] Discharge Instructions for Patients Receiving Chemotherapy  Today you received the following chemotherapy agents keytruda  To help prevent nausea and vomiting after your treatment, we encourage you to take your nausea medication as directed If you develop nausea and vomiting that is not controlled by your nausea medication, call the clinic.   BELOW ARE SYMPTOMS THAT SHOULD BE REPORTED IMMEDIATELY:  *FEVER GREATER THAN 100.5 F  *CHILLS WITH OR WITHOUT FEVER  NAUSEA AND VOMITING THAT IS NOT CONTROLLED WITH YOUR NAUSEA MEDICATION  *UNUSUAL SHORTNESS OF BREATH  *UNUSUAL BRUISING OR BLEEDING  TENDERNESS IN MOUTH AND THROAT WITH OR WITHOUT PRESENCE OF ULCERS  *URINARY PROBLEMS  *BOWEL PROBLEMS  UNUSUAL RASH Items with * indicate a potential emergency and should be followed up as soon as possible.  Feel free to call the clinic should you have any questions or concerns. The clinic phone number is [phone].  Please show the CHEMO ALERT CARD at check-in to the Emergency Department and triage nurse.   

## 2020-03-02 NOTE — Patient Instructions (Signed)

## 2020-03-02 NOTE — Assessment & Plan Note (Signed)
04/25/2019:Patient palpated a left breast and axillary mass x40month. [place] the left breast showed a 3.6cm mass at the 1 o'clock position with 6 smaller adjacent masses extending toward the nipple ranging in size from 1.1cm to 4.0cm. [place] the left axilla showed five bulky lymph nodes, the largest measuring 4.2cm, and second largest measuring 2.8cm. 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 T1c N2 stage IIIc Based on PET CT scan showing bilateral cervical nodes, it is stage IV(however her goal is to cure given the oligometastatic nature of her cancer.)  01/10/2020: Bilateral mastectomies with reconstruction (Cornett & Pace): Right breast: no evidence of malignancy Left breast: no evidence of residual carcinoma and 4 benign lymph nodes.   Treatment plan:  1.  Adjuvant radiation  2. adjuvant Pembrolizumab maintenance therapy x9 more cycles Immune mediated adverse effects: Hypothyroidism, I renewed her prescription for Synthroid.  Return to clinic every 3 weeks for pembrolizumab.

## 2020-03-03 LAB — THYROID PANEL WITH TSH
Free Thyroxine Index: 0.1 — ABNORMAL LOW (ref 1.2–4.9)
T3 Uptake Ratio: 6 % — ABNORMAL LOW (ref 24–39)
T4, Total: 1 ug/dL — ABNORMAL LOW (ref 4.5–12.0)
TSH: 76 u[IU]/mL — ABNORMAL HIGH (ref 0.450–4.500)

## 2020-03-04 NOTE — Progress Notes (Signed)
Patient is a 39 year old female here for follow-up after undergoing bilateral mastectomies with Dr. [name] and placement of tissue expanders with Flex HD with Dr. Claudia Desanctis on 01/10/2020.  She started radiation on 11/16 and will receive it daily for a total of 6 weeks ending on January 4.  ~ 8 weeks PO Patient reports she is doing well. No complaints. Reports she tolerated her last fill well and would like an additional fill today. Denies fever, chills, nausea, vomiting and pain.  She should continue to wear her compression garment during the day but does not need to wear it at night.  May resume normal activities as tolerated.  May shower normally.  Follow-up in 2 weeks for additional fill if desired.  Call office with any questions/concerns.  We placed injectable saline in the Expander using a sterile technique: Right: 60 cc for a total of 620 / 550 cc Left: 60 cc for a total of 620 / 550 cc

## 2020-03-05 ENCOUNTER — Ambulatory Visit
Admission: RE | Admit: 2020-03-05 | Discharge: 2020-03-05 | Disposition: A | Discharge status: Home / Self Care | Payer: 59 | Source: Ambulatory Visit | Attending: Radiation Oncology | Admitting: Radiation Oncology

## 2020-03-05 ENCOUNTER — Other Ambulatory Visit: Payer: Self-pay

## 2020-03-05 ENCOUNTER — Ambulatory Visit: Payer: 59

## 2020-03-05 DIAGNOSIS — R293 Abnormal posture: Secondary | ICD-10-CM

## 2020-03-05 DIAGNOSIS — C50812 Malignant neoplasm of overlapping sites of left female breast: Secondary | ICD-10-CM

## 2020-03-05 DIAGNOSIS — Z483 Aftercare following surgery for neoplasm: Secondary | ICD-10-CM

## 2020-03-05 DIAGNOSIS — Z5112 Encounter for antineoplastic immunotherapy: Secondary | ICD-10-CM | POA: Diagnosis not present

## 2020-03-05 DIAGNOSIS — Z171 Estrogen receptor negative status [ER-]: Secondary | ICD-10-CM

## 2020-03-05 DIAGNOSIS — M25612 Stiffness of left shoulder, not elsewhere classified: Secondary | ICD-10-CM

## 2020-03-05 NOTE — Patient Instructions (Signed)
Access Code: [email or web address] URL: [email or web address] Date: 03/05/2020 Prepared by: Cheral Almas  Exercises Standing Shoulder Horizontal Abduction with Resistance - 1 x daily - 3-4 x weekly - 1 sets - 10 reps Shoulder PNF D2 with Resistance - 1 x daily - 3-4 x weekly - 1 sets - 10 reps
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
## 2020-03-06 ENCOUNTER — Ambulatory Visit
Admission: RE | Admit: 2020-03-06 | Discharge: 2020-03-06 | Disposition: A | Discharge status: Home / Self Care | Payer: 59 | Source: Ambulatory Visit | Attending: Radiation Oncology | Admitting: Radiation Oncology

## 2020-03-06 DIAGNOSIS — Z5112 Encounter for antineoplastic immunotherapy: Secondary | ICD-10-CM | POA: Diagnosis not present

## 2020-03-07 ENCOUNTER — Ambulatory Visit (INDEPENDENT_AMBULATORY_CARE_PROVIDER_SITE_OTHER): Payer: 59 | Admitting: Plastic Surgery

## 2020-03-07 ENCOUNTER — Encounter: Payer: Self-pay | Admitting: Plastic Surgery

## 2020-03-07 ENCOUNTER — Ambulatory Visit
Admission: RE | Admit: 2020-03-07 | Discharge: 2020-03-07 | Disposition: A | Discharge status: Home / Self Care | Payer: 59 | Source: Ambulatory Visit | Attending: Radiation Oncology | Admitting: Radiation Oncology

## 2020-03-07 ENCOUNTER — Other Ambulatory Visit: Payer: Self-pay

## 2020-03-07 VITALS — HR 85 | Temp 98.6°F

## 2020-03-07 DIAGNOSIS — Z9013 Acquired absence of bilateral breasts and nipples: Secondary | ICD-10-CM

## 2020-03-07 DIAGNOSIS — Z5112 Encounter for antineoplastic immunotherapy: Secondary | ICD-10-CM | POA: Diagnosis not present

## 2020-03-12 ENCOUNTER — Ambulatory Visit
Admission: RE | Admit: 2020-03-12 | Discharge: 2020-03-12 | Disposition: A | Discharge status: Home / Self Care | Payer: 59 | Source: Ambulatory Visit | Attending: Radiation Oncology | Admitting: Radiation Oncology

## 2020-03-12 DIAGNOSIS — Z5112 Encounter for antineoplastic immunotherapy: Secondary | ICD-10-CM | POA: Diagnosis not present

## 2020-03-13 ENCOUNTER — Ambulatory Visit
Admission: RE | Admit: 2020-03-13 | Discharge: 2020-03-13 | Disposition: A | Discharge status: Home / Self Care | Payer: 59 | Source: Ambulatory Visit | Attending: Radiation Oncology | Admitting: Radiation Oncology

## 2020-03-13 DIAGNOSIS — Z5112 Encounter for antineoplastic immunotherapy: Secondary | ICD-10-CM | POA: Diagnosis not present

## 2020-03-14 ENCOUNTER — Ambulatory Visit
Admission: RE | Admit: 2020-03-14 | Discharge: 2020-03-14 | Disposition: A | Discharge status: Home / Self Care | Payer: 59 | Source: Ambulatory Visit | Attending: Radiation Oncology | Admitting: Radiation Oncology

## 2020-03-14 DIAGNOSIS — C50812 Malignant neoplasm of overlapping sites of left female breast: Secondary | ICD-10-CM | POA: Insufficient documentation

## 2020-03-14 DIAGNOSIS — Z170 Estrogen receptor positive status [ER+]: Secondary | ICD-10-CM | POA: Insufficient documentation

## 2020-03-14 DIAGNOSIS — Z510 Encounter for antineoplastic radiation therapy: Secondary | ICD-10-CM | POA: Diagnosis present

## 2020-03-15 ENCOUNTER — Ambulatory Visit
Admission: RE | Admit: 2020-03-15 | Discharge: 2020-03-15 | Disposition: A | Discharge status: Home / Self Care | Payer: 59 | Source: Ambulatory Visit | Attending: Radiation Oncology | Admitting: Radiation Oncology

## 2020-03-15 DIAGNOSIS — Z510 Encounter for antineoplastic radiation therapy: Secondary | ICD-10-CM | POA: Diagnosis not present

## 2020-03-16 ENCOUNTER — Ambulatory Visit
Admission: RE | Admit: 2020-03-16 | Discharge: 2020-03-16 | Disposition: A | Discharge status: Home / Self Care | Payer: 59 | Source: Ambulatory Visit | Attending: Radiation Oncology | Admitting: Radiation Oncology

## 2020-03-16 DIAGNOSIS — Z510 Encounter for antineoplastic radiation therapy: Secondary | ICD-10-CM | POA: Diagnosis not present

## 2020-03-19 ENCOUNTER — Ambulatory Visit
Admission: RE | Admit: 2020-03-19 | Discharge: 2020-03-19 | Disposition: A | Discharge status: Home / Self Care | Payer: 59 | Source: Ambulatory Visit | Attending: Radiation Oncology | Admitting: Radiation Oncology

## 2020-03-19 ENCOUNTER — Ambulatory Visit: Payer: 59 | Attending: Surgery

## 2020-03-19 DIAGNOSIS — Z483 Aftercare following surgery for neoplasm: Secondary | ICD-10-CM | POA: Insufficient documentation

## 2020-03-19 DIAGNOSIS — Z510 Encounter for antineoplastic radiation therapy: Secondary | ICD-10-CM | POA: Diagnosis not present

## 2020-03-20 ENCOUNTER — Ambulatory Visit
Admission: RE | Admit: 2020-03-20 | Discharge: 2020-03-20 | Disposition: A | Discharge status: Home / Self Care | Payer: 59 | Source: Ambulatory Visit | Attending: Radiation Oncology | Admitting: Radiation Oncology

## 2020-03-20 DIAGNOSIS — Z510 Encounter for antineoplastic radiation therapy: Secondary | ICD-10-CM | POA: Diagnosis not present

## 2020-03-21 ENCOUNTER — Ambulatory Visit: Payer: 59 | Admitting: Plastic Surgery

## 2020-03-21 ENCOUNTER — Other Ambulatory Visit: Payer: Self-pay

## 2020-03-21 ENCOUNTER — Ambulatory Visit
Admission: RE | Admit: 2020-03-21 | Discharge: 2020-03-21 | Disposition: A | Discharge status: Home / Self Care | Payer: 59 | Source: Ambulatory Visit | Attending: Radiation Oncology | Admitting: Radiation Oncology

## 2020-03-21 DIAGNOSIS — Z510 Encounter for antineoplastic radiation therapy: Secondary | ICD-10-CM | POA: Diagnosis not present

## 2020-03-22 ENCOUNTER — Ambulatory Visit
Admission: RE | Admit: 2020-03-22 | Discharge: 2020-03-22 | Disposition: A | Discharge status: Home / Self Care | Payer: 59 | Source: Ambulatory Visit | Attending: Radiation Oncology | Admitting: Radiation Oncology

## 2020-03-22 DIAGNOSIS — Z510 Encounter for antineoplastic radiation therapy: Secondary | ICD-10-CM | POA: Diagnosis not present

## 2020-03-23 ENCOUNTER — Inpatient Hospital Stay: Payer: 59

## 2020-03-23 ENCOUNTER — Other Ambulatory Visit: Payer: Self-pay

## 2020-03-23 ENCOUNTER — Inpatient Hospital Stay (HOSPITAL_BASED_OUTPATIENT_CLINIC_OR_DEPARTMENT_OTHER): Payer: 59 | Admitting: Hematology and Oncology

## 2020-03-23 ENCOUNTER — Ambulatory Visit
Admission: RE | Admit: 2020-03-23 | Discharge: 2020-03-23 | Disposition: A | Discharge status: Home / Self Care | Payer: 59 | Source: Ambulatory Visit | Attending: Radiation Oncology | Admitting: Radiation Oncology

## 2020-03-23 DIAGNOSIS — D6481 Anemia due to antineoplastic chemotherapy: Secondary | ICD-10-CM | POA: Insufficient documentation

## 2020-03-23 DIAGNOSIS — Z510 Encounter for antineoplastic radiation therapy: Secondary | ICD-10-CM | POA: Diagnosis not present

## 2020-03-23 DIAGNOSIS — E039 Hypothyroidism, unspecified: Secondary | ICD-10-CM | POA: Insufficient documentation

## 2020-03-23 DIAGNOSIS — Z5112 Encounter for antineoplastic immunotherapy: Secondary | ICD-10-CM | POA: Insufficient documentation

## 2020-03-23 DIAGNOSIS — Z171 Estrogen receptor negative status [ER-]: Secondary | ICD-10-CM

## 2020-03-23 DIAGNOSIS — Z452 Encounter for adjustment and management of vascular access device: Secondary | ICD-10-CM | POA: Insufficient documentation

## 2020-03-23 DIAGNOSIS — Z95828 Presence of other vascular implants and grafts: Secondary | ICD-10-CM

## 2020-03-23 DIAGNOSIS — C50812 Malignant neoplasm of overlapping sites of left female breast: Secondary | ICD-10-CM

## 2020-03-23 LAB — CBC WITH DIFFERENTIAL (CANCER CENTER ONLY)
Abs Immature Granulocytes: 0.03 10*3/uL (ref 0.00–0.07)
Basophils Absolute: 0.1 10*3/uL (ref 0.0–0.1)
Basophils Relative: 1 %
Eosinophils Absolute: 0.1 10*3/uL (ref 0.0–0.5)
Eosinophils Relative: 2 %
HCT: 34 % — ABNORMAL LOW (ref 36.0–46.0)
Hemoglobin: 11.2 g/dL — ABNORMAL LOW (ref 12.0–15.0)
Immature Granulocytes: 0 %
Lymphocytes Relative: 20 %
Lymphs Abs: 1.4 10*3/uL (ref 0.7–4.0)
MCH: 30.5 pg (ref 26.0–34.0)
MCHC: 32.9 g/dL (ref 30.0–36.0)
MCV: 92.6 fL (ref 80.0–100.0)
Monocytes Absolute: 0.5 10*3/uL (ref 0.1–1.0)
Monocytes Relative: 7 %
Neutro Abs: 4.7 10*3/uL (ref 1.7–7.7)
Neutrophils Relative %: 70 %
Platelet Count: 260 10*3/uL (ref 150–400)
RBC: 3.67 MIL/uL — ABNORMAL LOW (ref 3.87–5.11)
RDW: 13.1 % (ref 11.5–15.5)
WBC Count: 6.8 10*3/uL (ref 4.0–10.5)
nRBC: 0 % (ref 0.0–0.2)

## 2020-03-23 LAB — CMP (CANCER CENTER ONLY)
ALT: 16 U/L (ref 0–44)
AST: 23 U/L (ref 15–41)
Albumin: 3.8 g/dL (ref 3.5–5.0)
Alkaline Phosphatase: 80 U/L (ref 38–126)
Anion gap: 10 (ref 5–15)
BUN: 17 mg/dL (ref 6–20)
CO2: 26 mmol/L (ref 22–32)
Calcium: 9.4 mg/dL (ref 8.9–10.3)
Chloride: 103 mmol/L (ref 98–111)
Creatinine: 0.97 mg/dL (ref 0.44–1.00)
GFR, Estimated: >60 mL/min (ref >60)
Glucose, Bld: 84 mg/dL (ref 70–99)
Potassium: 3.8 mmol/L (ref 3.5–5.1)
Sodium: 139 mmol/L (ref 135–145)
Total Bilirubin: 0.4 mg/dL (ref 0.3–1.2)
Total Protein: 7.6 g/dL (ref 6.5–8.1)

## 2020-03-23 MED ORDER — SODIUM CHLORIDE 0.9 % IV SOLN
Freq: Once | INTRAVENOUS | Status: AC
  Filled 2020-03-23: qty 250

## 2020-03-23 MED ORDER — HEPARIN SOD (PORK) LOCK FLUSH 100 UNIT/ML IV SOLN
500.0000 [IU] | Freq: Once | INTRAVENOUS | Status: AC | PRN
  Administered 2020-03-23: 500 [IU]
  Filled 2020-03-23: qty 5

## 2020-03-23 MED ORDER — HEPARIN SOD (PORK) LOCK FLUSH 100 UNIT/ML IV SOLN
500.0000 [IU] | Freq: Once | INTRAVENOUS | Status: DC | PRN
  Filled 2020-03-23: qty 5

## 2020-03-23 MED ORDER — SODIUM CHLORIDE 0.9% FLUSH
10.0000 mL | INTRAVENOUS | Status: DC | PRN
  Administered 2020-03-23: 10 mL
  Filled 2020-03-23: qty 10

## 2020-03-23 MED ORDER — SODIUM CHLORIDE 0.9 % IV SOLN
200.0000 mg | Freq: Once | INTRAVENOUS | Status: AC
  Administered 2020-03-23: 200 mg via INTRAVENOUS
  Filled 2020-03-23: qty 8

## 2020-03-23 NOTE — Progress Notes (Signed)
Patient Care Team: Patient, No Pcp Per as PCP - General (General Practice) [name], RN as Oncology Nurse Navigator [name], RN as Oncology Nurse Navigator [name], MD as Consulting Physician (General Surgery) [name], MD as Consulting Physician (Hematology and Oncology) [name], MD as Consulting Physician (Radiation Oncology)  DIAGNOSIS:  Encounter Diagnosis  Name Primary?  . Malignant neoplasm of overlapping sites of left breast in female, estrogen receptor negative ([place])     SUMMARY OF ONCOLOGIC HISTORY: Oncology History  Malignant neoplasm of overlapping sites of left breast in female, estrogen receptor negative ([place])  04/25/2019 Initial Diagnosis   Patient palpated a left breast and axillary mass x63month. [place] the left breast showed a 3.6cm mass at the 1 o'clock position with 6 smaller adjacent masses extending toward the nipple ranging in size from 1.1cm to 4.0cm. [place] the left axilla showed five bulky lymph nodes, the largest measuring 4.2cm, and second largest measuring 2.8cm. 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    04/27/2019 Cancer Staging   Staging form: Breast, AJCC 8th Edition - Clinical: Stage IIIC (cT1c, cN2a, cM0, G3, ER-, PR-, HER2-) - Signed by GNicholas Lose MD on 04/27/2019   05/12/2019 - 10/27/2019 Chemotherapy   The patient had DOXOrubicin (ADRIAMYCIN) chemo injection 128 mg, 60 mg/m2 = 128 mg, Intravenous,  Once, 4 of 4 cycles Administration: 128 mg (05/12/2019), 128 mg (06/03/2019), 128 mg (06/23/2019), 128 mg (07/14/2019) palonosetron (ALOXI) injection 0.25 mg, 0.25 mg, Intravenous,  Once, 8 of 8 cycles Administration: 0.25 mg (05/12/2019), 0.25 mg (08/04/2019), 0.25 mg (06/03/2019), 0.25 mg (09/01/2019), 0.25 mg (06/23/2019), 0.25 mg (07/14/2019), 0.25 mg (09/22/2019), 0.25 mg (10/13/2019) pegfilgrastim-cbqv (UDENYCA) injection 6 mg, 6 mg, Subcutaneous, Once, 4 of 4 cycles Administration: 6  mg (05/14/2019), 6 mg (06/06/2019), 6 mg (06/25/2019), 6 mg (07/16/2019) CARBOplatin (PARAPLATIN) 700 mg in sodium chloride 0.9 % 250 mL chemo infusion, 700 mg (100 % of original dose 700 mg), Intravenous,  Once, 4 of 4 cycles Dose modification: 700 mg (original dose 700 mg, Cycle 5) Administration: 700 mg (08/04/2019), 700 mg (09/01/2019), 700 mg (09/22/2019), 700 mg (10/13/2019) cyclophosphamide (CYTOXAN) 1,280 mg in sodium chloride 0.9 % 250 mL chemo infusion, 600 mg/m2 = 1,280 mg, Intravenous,  Once, 4 of 4 cycles Administration: 1,280 mg (05/12/2019), 1,280 mg (06/03/2019), 1,280 mg (06/23/2019), 1,280 mg (07/14/2019) PACLitaxel (TAXOL) 174 mg in sodium chloride 0.9 % 250 mL chemo infusion (</= 82mm2), 80 mg/m2 = 174 mg, Intravenous,  Once, 4 of 4 cycles Dose modification: 65 mg/m2 (original dose 80 mg/m2, Cycle 6, Reason: Dose not tolerated) Administration: 174 mg (08/04/2019), 174 mg (08/11/2019), 138 mg (09/01/2019), 138 mg (08/25/2019), 138 mg (09/08/2019), 138 mg (09/15/2019), 138 mg (09/22/2019), 138 mg (09/29/2019), 138 mg (10/07/2019), 138 mg (10/13/2019), 138 mg (10/21/2019), 138 mg (10/27/2019) fosaprepitant (EMEND) 150 mg in sodium chloride 0.9 % 145 mL IVPB, 150 mg, Intravenous,  Once, 8 of 8 cycles Administration: 150 mg (05/12/2019), 150 mg (08/04/2019), 150 mg (06/03/2019), 150 mg (09/01/2019), 150 mg (06/23/2019), 150 mg (07/14/2019), 150 mg (09/22/2019), 150 mg (10/13/2019) pembrolizumab (KEYTRUDA) 200 mg in sodium chloride 0.9 % 50 mL chemo infusion, 2 mg/kg = 200 mg, Intravenous, Once, 8 of 8 cycles Administration: 200 mg (05/12/2019), 200 mg (06/03/2019), 200 mg (06/23/2019), 200 mg (07/14/2019), 200 mg (08/04/2019), 200 mg (09/01/2019), 200 mg (09/22/2019), 200 mg (10/13/2019)  for chemotherapy treatment.     Genetic Testing  Negative genetic testing. No pathogenic variants identified. VUS in HOXB13 called c.473C>A, VUS in POLD1 called c.1610G>C, and VUS in RAD50 called c.1094G>A identified on the [facility] Common  Hereditary Cancers Panel. The report date is 05/10/2019.  The Common Hereditary Cancers Panel offered by [facility] includes sequencing and/or deletion duplication testing of the following 48 genes: APC, ATM, AXIN2, BARD1, BMPR1A, BRCA1, BRCA2, BRIP1, CDH1, CDKN2A (p14ARF), CDKN2A (p16INK4a), CKD4, CHEK2, CTNNA1, DICER1, EPCAM (Deletion/duplication testing only), GREM1 (promoter region deletion/duplication testing only), KIT, MEN1, MLH1, MSH2, MSH3, MSH6, MUTYH, NBN, NF1, NHTL1, PALB2, PDGFRA, PMS2, POLD1, POLE, PTEN, RAD50, RAD51C, RAD51D, RNF43, SDHB, SDHC, SDHD, SMAD4, SMARCA4. STK11, TP53, TSC1, TSC2, and VHL.  The following genes were evaluated for sequence changes only: SDHA and HOXB13 c.251G>A variant only.   01/10/2020 Surgery   Bilateral mastectomies with reconstruction (Cornett & Pace): Right breast: no evidence tof malignancy Left breast: no evidence of residual carcinoma and 4 benign lymph nodes.    02/10/2020 -  Chemotherapy   The patient had pembrolizumab (KEYTRUDA) 200 mg in sodium chloride 0.9 % 50 mL chemo infusion, 200 mg, Intravenous, Once, 2 of 5 cycles Administration: 200 mg (02/10/2020), 200 mg (03/02/2020)  for chemotherapy treatment.    02/28/2020 -  Radiation Therapy   Adjuvant radiation     CHIEF COMPLIANT: Follow-up on immunotherapy with pembrolizumab  INTERVAL HISTORY: Heather Mckenzie is a 71-year with above-mentioned history of triple negative breast cancer who underwent neoadjuvant chemotherapy and had a complete pathologic response and is currently on maintenance therapy with pembrolizumab.  She is tolerating it extremely well without any problems or concerns.  She does have hypothyroidism for which she is on Synthroid.  She follows with endocrinology for this.  She tells me that she is able to tolerate the Synthroid at 88 mcg fairly well.   ALLERGIES:  has No Known Allergies.  MEDICATIONS:  Current Outpatient Medications  Medication Sig Dispense Refill  .  levonorgestrel-ethinyl estradiol (AMETHYST) 90-20 MCG tablet Take 1 tablet by mouth daily. (28) 90 mcg-20 mcg tablet    . levothyroxine (SYNTHROID) 88 MCG tablet Take 1 tablet (88 mcg total) by mouth daily before breakfast. 30 tablet 3   No current facility-administered medications for this visit.    PHYSICAL EXAMINATION: ECOG PERFORMANCE STATUS: 1 - Symptomatic but completely ambulatory  Vitals:   03/23/20 1149  BP: 115/72  Pulse: 79  Resp: 18  Temp: 98.5 F (36.9 C)  SpO2: 100%   Filed Weights   03/23/20 1149  Weight: 214 lb 12.8 oz (97.4 kg)    LABORATORY DATA:  I have reviewed the data as listed CMP Latest Ref Rng & Units 03/23/2020 03/02/2020 02/10/2020  Glucose 70 - 99 mg/dL 84 87 92  BUN 6 - 20 mg/dL '17 16 16  ' Creatinine 0.44 - 1.00 mg/dL 0.97 0.94 0.94  Sodium 135 - 145 mmol/L 139 138 141  Potassium 3.5 - 5.1 mmol/L 3.8 3.9 3.7  Chloride 98 - 111 mmol/L 103 102 106  CO2 22 - 32 mmol/L '26 24 24  ' Calcium 8.9 - 10.3 mg/dL 9.4 9.4 9.3  Total Protein 6.5 - 8.1 g/dL 7.6 7.6 7.1  Total Bilirubin 0.3 - 1.2 mg/dL 0.4 0.4 0.3  Alkaline Phos 38 - 126 U/L 80 84 83  AST 15 - 41 U/L '23 24 16  ' ALT 0 - 44 U/L '16 11 11    ' Lab Results  Component Value Date   WBC 6.8 03/23/2020   HGB 11.2 (L) 03/23/2020  HCT 34.0 (L) 03/23/2020   MCV 92.6 03/23/2020   PLT 260 03/23/2020   NEUTROABS 4.7 03/23/2020    ASSESSMENT & PLAN:  Malignant neoplasm of overlapping sites of left breast in female, estrogen receptor negative ([place]) 04/25/2019:Patient palpated a left breast and axillary mass x63month. [place] the left breast showed a 3.6cm mass at the 1 o'clock position with 6 smaller adjacent masses extending toward the nipple ranging in size from 1.1cm to 4.0cm. [place] the left axilla showed five bulky lymph nodes, the largest measuring 4.2cm, and second largest measuring 2.8cm. 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 T1c N2 stage IIIc Based on  PET CT scan showing bilateral cervical nodes, it is stage IV(however her goal is to cure given the oligometastatic nature of her cancer.)  01/10/2020:Bilateral mastectomies with reconstruction ([facility]ett & Pace): Right breast: no evidence of malignancy Left breast: no evidence of residual carcinoma and 4 benign lymph nodes.  Treatment plan: 1.Adjuvant radiation  2.adjuvantPembrolizumab maintenance therapy   Immune mediated adverse effects: Hypothyroidism, onSynthroid. Liver function tests are normal. Chemo induced anemia: Hemoglobin today is 11.2  Return to clinic every 3 weeks for pembrolizumab.    No orders of the defined types were placed in this encounter.  The patient has a good understanding of the overall plan. she agrees with it. she will call with any problems that may develop before the next visit here. Total time spent: 30 mins including face to face time and time spent for planning, charting and co-ordination of care   VHarriette Ohara MD 03/23/20

## 2020-03-23 NOTE — Assessment & Plan Note (Signed)
04/25/2019:Patient palpated a left breast and axillary mass x85month. [place] the left breast showed a 3.6cm mass at the 1 o'clock position with 6 smaller adjacent masses extending toward the nipple ranging in size from 1.1cm to 4.0cm. [place] the left axilla showed five bulky lymph nodes, the largest measuring 4.2cm, and second largest measuring 2.8cm. 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 T1c N2 stage IIIc Based on PET CT scan showing bilateral cervical nodes, it is stage IV(however her goal is to cure given the oligometastatic nature of her cancer.)  01/10/2020:Bilateral mastectomies with reconstruction ([facility]ett & Pace): Right breast: no evidence of malignancy Left breast: no evidence of residual carcinoma and 4 benign lymph nodes.  Treatment plan: 1.Adjuvant radiation  2.adjuvantPembrolizumab maintenance therapy   Immune mediated adverse effects: Hypothyroidism, onSynthroid.  Chemo induced anemia: Hemoglobin today is 11.8  Return to clinic every 3 weeks for pembrolizumab.

## 2020-03-24 LAB — THYROID PANEL WITH TSH
Free Thyroxine Index: 0.2 — ABNORMAL LOW (ref 1.2–4.9)
T3 Uptake Ratio: 10 % — ABNORMAL LOW (ref 24–39)
T4, Total: 1.6 ug/dL — ABNORMAL LOW (ref 4.5–12.0)
TSH: 71.5 u[IU]/mL — ABNORMAL HIGH (ref 0.450–4.500)

## 2020-03-26 ENCOUNTER — Telehealth: Payer: Self-pay | Admitting: Hematology and Oncology

## 2020-03-26 ENCOUNTER — Ambulatory Visit
Admission: RE | Admit: 2020-03-26 | Discharge: 2020-03-26 | Disposition: A | Discharge status: Home / Self Care | Payer: 59 | Source: Ambulatory Visit | Attending: Radiation Oncology | Admitting: Radiation Oncology

## 2020-03-26 DIAGNOSIS — Z510 Encounter for antineoplastic radiation therapy: Secondary | ICD-10-CM | POA: Diagnosis not present

## 2020-03-26 NOTE — Telephone Encounter (Signed)
No 12/10 los, no changes made to pt schedule

## 2020-03-27 ENCOUNTER — Ambulatory Visit: Payer: 59 | Admitting: Radiation Oncology

## 2020-03-27 ENCOUNTER — Ambulatory Visit
Admission: RE | Admit: 2020-03-27 | Discharge: 2020-03-27 | Disposition: A | Discharge status: Home / Self Care | Payer: 59 | Source: Ambulatory Visit | Attending: Radiation Oncology | Admitting: Radiation Oncology

## 2020-03-27 ENCOUNTER — Encounter: Payer: Self-pay | Admitting: *Deleted

## 2020-03-27 DIAGNOSIS — Z510 Encounter for antineoplastic radiation therapy: Secondary | ICD-10-CM | POA: Diagnosis not present

## 2020-03-28 ENCOUNTER — Ambulatory Visit
Admission: RE | Admit: 2020-03-28 | Discharge: 2020-03-28 | Disposition: A | Discharge status: Home / Self Care | Payer: 59 | Source: Ambulatory Visit | Attending: Radiation Oncology | Admitting: Radiation Oncology

## 2020-03-28 DIAGNOSIS — Z510 Encounter for antineoplastic radiation therapy: Secondary | ICD-10-CM | POA: Diagnosis not present

## 2020-03-29 ENCOUNTER — Other Ambulatory Visit: Payer: Self-pay

## 2020-03-29 ENCOUNTER — Ambulatory Visit
Admission: RE | Admit: 2020-03-29 | Discharge: 2020-03-29 | Disposition: A | Discharge status: Home / Self Care | Payer: 59 | Source: Ambulatory Visit | Attending: Radiation Oncology | Admitting: Radiation Oncology

## 2020-03-29 DIAGNOSIS — Z510 Encounter for antineoplastic radiation therapy: Secondary | ICD-10-CM | POA: Diagnosis not present

## 2020-03-30 ENCOUNTER — Ambulatory Visit
Admission: RE | Admit: 2020-03-30 | Discharge: 2020-03-30 | Disposition: A | Discharge status: Home / Self Care | Payer: 59 | Source: Ambulatory Visit | Attending: Radiation Oncology | Admitting: Radiation Oncology

## 2020-03-30 DIAGNOSIS — Z510 Encounter for antineoplastic radiation therapy: Secondary | ICD-10-CM | POA: Diagnosis not present

## 2020-04-02 ENCOUNTER — Ambulatory Visit
Admission: RE | Admit: 2020-04-02 | Discharge: 2020-04-02 | Disposition: A | Discharge status: Home / Self Care | Payer: 59 | Source: Ambulatory Visit | Attending: Radiation Oncology | Admitting: Radiation Oncology

## 2020-04-02 DIAGNOSIS — Z510 Encounter for antineoplastic radiation therapy: Secondary | ICD-10-CM | POA: Diagnosis not present

## 2020-04-03 ENCOUNTER — Ambulatory Visit
Admission: RE | Admit: 2020-04-03 | Discharge: 2020-04-03 | Disposition: A | Discharge status: Home / Self Care | Payer: 59 | Source: Ambulatory Visit | Attending: Radiation Oncology | Admitting: Radiation Oncology

## 2020-04-03 ENCOUNTER — Ambulatory Visit: Payer: 59 | Admitting: Radiation Oncology

## 2020-04-03 DIAGNOSIS — Z510 Encounter for antineoplastic radiation therapy: Secondary | ICD-10-CM | POA: Diagnosis not present

## 2020-04-04 ENCOUNTER — Ambulatory Visit
Admission: RE | Admit: 2020-04-04 | Discharge: 2020-04-04 | Disposition: A | Discharge status: Home / Self Care | Payer: 59 | Source: Ambulatory Visit | Attending: Radiation Oncology | Admitting: Radiation Oncology

## 2020-04-04 DIAGNOSIS — Z510 Encounter for antineoplastic radiation therapy: Secondary | ICD-10-CM | POA: Diagnosis not present

## 2020-04-05 ENCOUNTER — Ambulatory Visit
Admission: RE | Admit: 2020-04-05 | Discharge: 2020-04-05 | Disposition: A | Discharge status: Home / Self Care | Payer: 59 | Source: Ambulatory Visit | Attending: Radiation Oncology | Admitting: Radiation Oncology

## 2020-04-05 DIAGNOSIS — Z510 Encounter for antineoplastic radiation therapy: Secondary | ICD-10-CM | POA: Diagnosis not present

## 2020-04-09 ENCOUNTER — Ambulatory Visit
Admission: RE | Admit: 2020-04-09 | Discharge: 2020-04-09 | Disposition: A | Discharge status: Home / Self Care | Payer: 59 | Source: Ambulatory Visit | Attending: Radiation Oncology | Admitting: Radiation Oncology

## 2020-04-09 ENCOUNTER — Ambulatory Visit: Payer: 59 | Admitting: Rehabilitation

## 2020-04-09 ENCOUNTER — Other Ambulatory Visit: Payer: Self-pay

## 2020-04-09 ENCOUNTER — Encounter: Payer: Self-pay | Admitting: Rehabilitation

## 2020-04-09 DIAGNOSIS — Z510 Encounter for antineoplastic radiation therapy: Secondary | ICD-10-CM | POA: Diagnosis not present

## 2020-04-09 DIAGNOSIS — Z483 Aftercare following surgery for neoplasm: Secondary | ICD-10-CM
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
## 2020-04-10 ENCOUNTER — Ambulatory Visit
Admission: RE | Admit: 2020-04-10 | Discharge: 2020-04-10 | Disposition: A | Discharge status: Home / Self Care | Payer: 59 | Source: Ambulatory Visit | Attending: Radiation Oncology | Admitting: Radiation Oncology

## 2020-04-10 DIAGNOSIS — Z510 Encounter for antineoplastic radiation therapy: Secondary | ICD-10-CM | POA: Diagnosis not present

## 2020-04-11 ENCOUNTER — Ambulatory Visit: Payer: 59

## 2020-04-12 ENCOUNTER — Inpatient Hospital Stay: Payer: 59

## 2020-04-12 ENCOUNTER — Ambulatory Visit
Admission: RE | Admit: 2020-04-12 | Discharge: 2020-04-12 | Disposition: A | Discharge status: Home / Self Care | Payer: 59 | Source: Ambulatory Visit | Attending: Radiation Oncology | Admitting: Radiation Oncology

## 2020-04-12 ENCOUNTER — Other Ambulatory Visit: Payer: Self-pay

## 2020-04-12 VITALS — BP 104/70 | HR 77 | Temp 98.3°F | Resp 18 | Wt 211.5 lb

## 2020-04-12 DIAGNOSIS — Z171 Estrogen receptor negative status [ER-]: Secondary | ICD-10-CM

## 2020-04-12 DIAGNOSIS — Z510 Encounter for antineoplastic radiation therapy: Secondary | ICD-10-CM | POA: Diagnosis not present

## 2020-04-12 DIAGNOSIS — Z95828 Presence of other vascular implants and grafts: Secondary | ICD-10-CM

## 2020-04-12 LAB — CMP (CANCER CENTER ONLY)
ALT: 18 U/L (ref 0–44)
AST: 23 U/L (ref 15–41)
Albumin: 3.8 g/dL (ref 3.5–5.0)
Alkaline Phosphatase: 79 U/L (ref 38–126)
Anion gap: 6 (ref 5–15)
BUN: 15 mg/dL (ref 6–20)
CO2: 28 mmol/L (ref 22–32)
Calcium: 9.2 mg/dL (ref 8.9–10.3)
Chloride: 106 mmol/L (ref 98–111)
Creatinine: 0.91 mg/dL (ref 0.44–1.00)
GFR, Estimated: >60 mL/min (ref >60)
Glucose, Bld: 89 mg/dL (ref 70–99)
Potassium: 4.1 mmol/L (ref 3.5–5.1)
Sodium: 140 mmol/L (ref 135–145)
Total Bilirubin: 0.3 mg/dL (ref 0.3–1.2)
Total Protein: 7.6 g/dL (ref 6.5–8.1)

## 2020-04-12 LAB — CBC WITH DIFFERENTIAL (CANCER CENTER ONLY)
Abs Immature Granulocytes: 0.01 10*3/uL (ref 0.00–0.07)
Basophils Absolute: 0 10*3/uL (ref 0.0–0.1)
Basophils Relative: 1 %
Eosinophils Absolute: 0.3 10*3/uL (ref 0.0–0.5)
Eosinophils Relative: 6 %
HCT: 35.7 % — ABNORMAL LOW (ref 36.0–46.0)
Hemoglobin: 11.8 g/dL — ABNORMAL LOW (ref 12.0–15.0)
Immature Granulocytes: 0 %
Lymphocytes Relative: 21 %
Lymphs Abs: 1.1 10*3/uL (ref 0.7–4.0)
MCH: 30.6 pg (ref 26.0–34.0)
MCHC: 33.1 g/dL (ref 30.0–36.0)
MCV: 92.5 fL (ref 80.0–100.0)
Monocytes Absolute: 0.5 10*3/uL (ref 0.1–1.0)
Monocytes Relative: 9 %
Neutro Abs: 3.2 10*3/uL (ref 1.7–7.7)
Neutrophils Relative %: 63 %
Platelet Count: 212 10*3/uL (ref 150–400)
RBC: 3.86 MIL/uL — ABNORMAL LOW (ref 3.87–5.11)
RDW: 13.6 % (ref 11.5–15.5)
WBC Count: 5.1 10*3/uL (ref 4.0–10.5)
nRBC: 0 % (ref 0.0–0.2)

## 2020-04-12 LAB — TSH: TSH: 59.253 u[IU]/mL — ABNORMAL HIGH (ref 0.308–3.960)

## 2020-04-12 MED ORDER — HEPARIN SOD (PORK) LOCK FLUSH 100 UNIT/ML IV SOLN
500.0000 [IU] | Freq: Once | INTRAVENOUS | Status: AC | PRN
  Administered 2020-04-12: 500 [IU]
  Filled 2020-04-12: qty 5

## 2020-04-12 MED ORDER — ALTEPLASE 2 MG IJ SOLR
2.0000 mg | Freq: Once | INTRAMUSCULAR | Status: AC | PRN
  Administered 2020-04-12: 2 mg
  Filled 2020-04-12: qty 2

## 2020-04-12 MED ORDER — SODIUM CHLORIDE 0.9 % IV SOLN
Freq: Once | INTRAVENOUS | Status: AC
  Filled 2020-04-12: qty 250

## 2020-04-12 MED ORDER — SODIUM CHLORIDE 0.9 % IV SOLN
200.0000 mg | Freq: Once | INTRAVENOUS | Status: AC
  Administered 2020-04-12: 200 mg via INTRAVENOUS
  Filled 2020-04-12: qty 8

## 2020-04-12 MED ORDER — ALTEPLASE 2 MG IJ SOLR
INTRAMUSCULAR | Status: AC
  Filled 2020-04-12: qty 2

## 2020-04-12 MED ORDER — SODIUM CHLORIDE 0.9% FLUSH
10.0000 mL | INTRAVENOUS | Status: DC | PRN
  Administered 2020-04-12: 10 mL
  Filled 2020-04-12: qty 10

## 2020-04-12 NOTE — Patient Instructions (Signed)
[facility] Discharge Instructions for Patients Receiving Chemotherapy  Today you received the following chemotherapy agents keytruda  To help prevent nausea and vomiting after your treatment, we encourage you to take your nausea medication as directed If you develop nausea and vomiting that is not controlled by your nausea medication, call the clinic.   BELOW ARE SYMPTOMS THAT SHOULD BE REPORTED IMMEDIATELY:  *FEVER GREATER THAN 100.5 F  *CHILLS WITH OR WITHOUT FEVER  NAUSEA AND VOMITING THAT IS NOT CONTROLLED WITH YOUR NAUSEA MEDICATION  *UNUSUAL SHORTNESS OF BREATH  *UNUSUAL BRUISING OR BLEEDING  TENDERNESS IN MOUTH AND THROAT WITH OR WITHOUT PRESENCE OF ULCERS  *URINARY PROBLEMS  *BOWEL PROBLEMS  UNUSUAL RASH Items with * indicate a potential emergency and should be followed up as soon as possible.  Feel free to call the clinic should you have any questions or concerns. The clinic phone number is [phone].  Please show the CHEMO ALERT CARD at check-in to the Emergency Department and triage nurse.   

## 2020-04-12 NOTE — Progress Notes (Signed)
Unable to get blood return from port. cathflo administered by [employer]. Patient sent back to lab to get labs drawn from arm

## 2020-04-13 LAB — T4: T4, Total: 2.5 ug/dL — ABNORMAL LOW (ref 4.5–12.0)

## 2020-04-16 ENCOUNTER — Encounter: Payer: Self-pay | Admitting: *Deleted

## 2020-04-16 ENCOUNTER — Ambulatory Visit
Admission: RE | Admit: 2020-04-16 | Discharge: 2020-04-16 | Disposition: A | Discharge status: Home / Self Care | Payer: 59 | Source: Ambulatory Visit | Attending: Radiation Oncology | Admitting: Radiation Oncology

## 2020-04-16 DIAGNOSIS — Z483 Aftercare following surgery for neoplasm: Secondary | ICD-10-CM | POA: Diagnosis present

## 2020-04-16 DIAGNOSIS — Z171 Estrogen receptor negative status [ER-]: Secondary | ICD-10-CM | POA: Diagnosis present

## 2020-04-16 DIAGNOSIS — R293 Abnormal posture: Secondary | ICD-10-CM | POA: Diagnosis present

## 2020-04-16 DIAGNOSIS — Z510 Encounter for antineoplastic radiation therapy: Secondary | ICD-10-CM | POA: Insufficient documentation

## 2020-04-16 DIAGNOSIS — Z170 Estrogen receptor positive status [ER+]: Secondary | ICD-10-CM | POA: Insufficient documentation

## 2020-04-16 DIAGNOSIS — M25612 Stiffness of left shoulder, not elsewhere classified: Secondary | ICD-10-CM | POA: Diagnosis present

## 2020-04-16 DIAGNOSIS — C50812 Malignant neoplasm of overlapping sites of left female breast: Secondary | ICD-10-CM | POA: Insufficient documentation

## 2020-04-17 ENCOUNTER — Other Ambulatory Visit: Payer: Self-pay

## 2020-04-17 ENCOUNTER — Ambulatory Visit: Payer: 59

## 2020-04-17 ENCOUNTER — Ambulatory Visit
Admission: RE | Admit: 2020-04-17 | Discharge: 2020-04-17 | Disposition: A | Discharge status: Home / Self Care | Payer: 59 | Source: Ambulatory Visit | Attending: Radiation Oncology | Admitting: Radiation Oncology

## 2020-04-17 ENCOUNTER — Ambulatory Visit: Payer: 59 | Attending: Surgery

## 2020-04-17 DIAGNOSIS — M25612 Stiffness of left shoulder, not elsewhere classified: Secondary | ICD-10-CM

## 2020-04-17 DIAGNOSIS — R293 Abnormal posture: Secondary | ICD-10-CM

## 2020-04-17 DIAGNOSIS — Z171 Estrogen receptor negative status [ER-]: Secondary | ICD-10-CM

## 2020-04-17 DIAGNOSIS — Z483 Aftercare following surgery for neoplasm: Secondary | ICD-10-CM | POA: Diagnosis not present

## 2020-04-17 DIAGNOSIS — C50812 Malignant neoplasm of overlapping sites of left female breast: Secondary | ICD-10-CM | POA: Insufficient documentation

## 2020-04-17 NOTE — Patient Instructions (Signed)
Pt given handout for ways to prevent lymphedema and reasoning behind
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
## 2020-04-18 ENCOUNTER — Ambulatory Visit
Admission: RE | Admit: 2020-04-18 | Discharge: 2020-04-18 | Disposition: A | Discharge status: Home / Self Care | Payer: 59 | Source: Ambulatory Visit | Attending: Radiation Oncology | Admitting: Radiation Oncology

## 2020-04-18 ENCOUNTER — Encounter: Payer: Self-pay | Admitting: Radiation Oncology

## 2020-04-18 DIAGNOSIS — Z483 Aftercare following surgery for neoplasm: Secondary | ICD-10-CM | POA: Diagnosis not present

## 2020-05-01 NOTE — Assessment & Plan Note (Signed)
04/25/2019:Patient palpated a left breast and axillary mass x53month. [place] the left breast showed a 3.6cm mass at the 1 o'clock position with 6 smaller adjacent masses extending toward the nipple ranging in size from 1.1cm to 4.0cm. [place] the left axilla showed five bulky lymph nodes, the largest measuring 4.2cm, and second largest measuring 2.8cm. 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 T1c N2 stage IIIc Based on PET CT scan showing bilateral cervical nodes, it is stage IV(however her goal is to cure given the oligometastatic nature of her cancer.)  01/10/2020:Bilateral mastectomies with reconstruction (Cornett & Pace): Right breast: no evidence of malignancy Left breast: no evidence of residual carcinoma and 4 benign lymph nodes.  Treatment plan: 1.Adjuvant radiation completed 04/18/2020 2.adjuvantPembrolizumab maintenance therapy   Immune mediated adverse effects: Hypothyroidism,onSynthroid. Liver function tests are normal. Chemo induced anemia: Hemoglobin today is    Return to clinic every 3 weeks for pembrolizumab.

## 2020-05-02 ENCOUNTER — Other Ambulatory Visit: Payer: Self-pay

## 2020-05-02 ENCOUNTER — Ambulatory Visit (INDEPENDENT_AMBULATORY_CARE_PROVIDER_SITE_OTHER): Payer: 59 | Admitting: Surgical

## 2020-05-02 ENCOUNTER — Encounter: Payer: Self-pay | Admitting: Surgical

## 2020-05-02 ENCOUNTER — Ambulatory Visit: Payer: 59 | Admitting: Plastic Surgery

## 2020-05-02 VITALS — BP 117/85 | HR 75

## 2020-05-02 DIAGNOSIS — C50812 Malignant neoplasm of overlapping sites of left female breast: Secondary | ICD-10-CM

## 2020-05-02 DIAGNOSIS — Z171 Estrogen receptor negative status [ER-]: Secondary | ICD-10-CM

## 2020-05-02 DIAGNOSIS — Z9013 Acquired absence of bilateral breasts and nipples: Secondary | ICD-10-CM

## 2020-05-02 NOTE — Progress Notes (Signed)
Patient is a 40 year old female here for follow-up after undergoing bilateral mastectomies with Dr. [name] followed by placement of bilateral tissue expanders with Flex HD by Dr. Claudia Desanctis on 01/10/2020.  Patient has started radiation on 02/28/2020 and has recently completed radiation on 04/19/2019.  Patient reports that overall radiation went well, she reports that she is hopeful she will continue to be able to fill as she would like to be a little bit larger.  Chaperone present on exam On exam bilateral breast incisions are intact, no wounds noted.  She does have radiation skin changes of the left breast.  She has volume loss of bilateral superior poles, left greater than right.  There is no drainage noted.  I discussed with the patient that typically we do not feel after radiation as putting additional stress on the skin can increase her risk of incisional dehiscence, postradiation wounds.  She does have a fair amount of laxity of bilateral mastectomy flap skin.  I discussed with the patient that I would further discuss this with Dr. Claudia Desanctis, however feeling after radiation but that increased risk on dehiscence which could result in loss of the expander.  Patient is understanding of this, but reports that she was under the impression that she would be able to continue feels after completion of radiation.  We also discussed that we would wait 6 months prior to exchanging the expanders for implants to allow the skin to heal after the radiation.  Patient is aware and in agreement with this.  I discussed with her that I would go over the plan with Dr. Claudia Desanctis and call her with an update and schedule her an appointment to come in for reevaluation.  Patient is in agreement with this.  We did not place injectable saline in the Expander using a sterile technique: Right: 0 cc for a total of 620 / 550 cc Left: 0 cc for a total of 620 / 550 cc  Pictures were obtained of the patient and placed in the chart with the patient's  or guardian's permission.

## 2020-05-03 NOTE — Progress Notes (Signed)
 Patient Care Team: Patient, No Pcp Per as PCP - General (General Practice) Martini, Keisha N, RN as Oncology Nurse Navigator Stuart, Dawn C, RN as Oncology Nurse Navigator Cornett, Thomas, MD as Consulting Physician (General Surgery) Alexzia Kasler, MD as Consulting Physician (Hematology and Oncology) Kinard, James, MD as Consulting Physician (Radiation Oncology)  DIAGNOSIS:    ICD-10-CM   1. Malignant neoplasm of overlapping sites of left breast in female, estrogen receptor negative (HCC)  C50.812    Z17.1     SUMMARY OF ONCOLOGIC HISTORY: Oncology History  Malignant neoplasm of overlapping sites of left breast in female, estrogen receptor negative (HCC)  04/25/2019 Initial Diagnosis   Patient palpated a left breast and axillary mass x3months. US of the left breast showed a 3.6cm mass at the 1 o'clock position with 6 smaller adjacent masses extending toward the nipple ranging in size from 1.1cm to 4.0cm. US of the left axilla showed five bulky lymph nodes, the largest measuring 4.2cm, and second largest measuring 2.8cm. 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    04/27/2019 Cancer Staging   Staging form: Breast, AJCC 8th Edition - Clinical: Stage IIIC (cT1c, cN2a, cM0, G3, ER-, PR-, HER2-) - Signed by Hayla Hinger, MD on 04/27/2019   05/12/2019 - 10/27/2019 Chemotherapy   The patient had DOXOrubicin (ADRIAMYCIN) chemo injection 128 mg, 60 mg/m2 = 128 mg, Intravenous,  Once, 4 of 4 cycles Administration: 128 mg (05/12/2019), 128 mg (06/03/2019), 128 mg (06/23/2019), 128 mg (07/14/2019) palonosetron (ALOXI) injection 0.25 mg, 0.25 mg, Intravenous,  Once, 8 of 8 cycles Administration: 0.25 mg (05/12/2019), 0.25 mg (08/04/2019), 0.25 mg (06/03/2019), 0.25 mg (09/01/2019), 0.25 mg (06/23/2019), 0.25 mg (07/14/2019), 0.25 mg (09/22/2019), 0.25 mg (10/13/2019) pegfilgrastim-cbqv (UDENYCA) injection 6 mg, 6 mg, Subcutaneous, Once, 4 of 4 cycles Administration: 6 mg  (05/14/2019), 6 mg (06/06/2019), 6 mg (06/25/2019), 6 mg (07/16/2019) CARBOplatin (PARAPLATIN) 700 mg in sodium chloride 0.9 % 250 mL chemo infusion, 700 mg (100 % of original dose 700 mg), Intravenous,  Once, 4 of 4 cycles Dose modification: 700 mg (original dose 700 mg, Cycle 5) Administration: 700 mg (08/04/2019), 700 mg (09/01/2019), 700 mg (09/22/2019), 700 mg (10/13/2019) cyclophosphamide (CYTOXAN) 1,280 mg in sodium chloride 0.9 % 250 mL chemo infusion, 600 mg/m2 = 1,280 mg, Intravenous,  Once, 4 of 4 cycles Administration: 1,280 mg (05/12/2019), 1,280 mg (06/03/2019), 1,280 mg (06/23/2019), 1,280 mg (07/14/2019) PACLitaxel (TAXOL) 174 mg in sodium chloride 0.9 % 250 mL chemo infusion (</= 80mg/m2), 80 mg/m2 = 174 mg, Intravenous,  Once, 4 of 4 cycles Dose modification: 65 mg/m2 (original dose 80 mg/m2, Cycle 6, Reason: Dose not tolerated) Administration: 174 mg (08/04/2019), 174 mg (08/11/2019), 138 mg (09/01/2019), 138 mg (08/25/2019), 138 mg (09/08/2019), 138 mg (09/15/2019), 138 mg (09/22/2019), 138 mg (09/29/2019), 138 mg (10/07/2019), 138 mg (10/13/2019), 138 mg (10/21/2019), 138 mg (10/27/2019) fosaprepitant (EMEND) 150 mg in sodium chloride 0.9 % 145 mL IVPB, 150 mg, Intravenous,  Once, 8 of 8 cycles Administration: 150 mg (05/12/2019), 150 mg (08/04/2019), 150 mg (06/03/2019), 150 mg (09/01/2019), 150 mg (06/23/2019), 150 mg (07/14/2019), 150 mg (09/22/2019), 150 mg (10/13/2019) pembrolizumab (KEYTRUDA) 200 mg in sodium chloride 0.9 % 50 mL chemo infusion, 2 mg/kg = 200 mg, Intravenous, Once, 8 of 8 cycles Administration: 200 mg (05/12/2019), 200 mg (06/03/2019), 200 mg (06/23/2019), 200 mg (07/14/2019), 200 mg (08/04/2019), 200 mg (09/01/2019), 200 mg (09/22/2019), 200 mg (10/13/2019)  for chemotherapy treatment.       Genetic Testing   Negative genetic testing. No pathogenic variants identified. VUS in HOXB13 called c.473C>A, VUS in POLD1 called c.1610G>C, and VUS in RAD50 called c.1094G>A identified on the [facility] Common Hereditary  Cancers Panel. The report date is 05/10/2019.  The Common Hereditary Cancers Panel offered by [facility] includes sequencing and/or deletion duplication testing of the following 48 genes: APC, ATM, AXIN2, BARD1, BMPR1A, BRCA1, BRCA2, BRIP1, CDH1, CDKN2A (p14ARF), CDKN2A (p16INK4a), CKD4, CHEK2, CTNNA1, DICER1, EPCAM (Deletion/duplication testing only), GREM1 (promoter region deletion/duplication testing only), KIT, MEN1, MLH1, MSH2, MSH3, MSH6, MUTYH, NBN, NF1, NHTL1, PALB2, PDGFRA, PMS2, POLD1, POLE, PTEN, RAD50, RAD51C, RAD51D, RNF43, SDHB, SDHC, SDHD, SMAD4, SMARCA4. STK11, TP53, TSC1, TSC2, and VHL.  The following genes were evaluated for sequence changes only: SDHA and HOXB13 c.251G>A variant only.   01/10/2020 Surgery   Bilateral mastectomies with reconstruction (Cornett & Pace): Right breast: no evidence tof malignancy Left breast: no evidence of residual carcinoma and 4 benign lymph nodes.    02/10/2020 -  Chemotherapy   The patient had pembrolizumab (KEYTRUDA) 200 mg in sodium chloride 0.9 % 50 mL chemo infusion, 200 mg, Intravenous, Once, 4 of 5 cycles Administration: 200 mg (02/10/2020), 200 mg (03/23/2020), 200 mg (04/12/2020), 200 mg (03/02/2020)  for chemotherapy treatment.    02/28/2020 -  Radiation Therapy   Adjuvant radiation     CHIEF COMPLIANT: Follow-up on immunotherapy with pembrolizumab  INTERVAL HISTORY: Heather Mckenzie is a 40 y.o. with above-mentioned history of triple negative breast cancer who underwent neoadjuvant chemotherapy and had a complete pathologic response and is currently on maintenance therapy with pembrolizumab. She presents to the clinic today for treatment.  She is tolerating pembrolizumab extremely well without any other new problems or concerns.  We are monitoring her thyroid function and she has endocrinologist who is helping [place] with dosing of thyroid medication.  ALLERGIES:  has No Known Allergies.  MEDICATIONS:  Current Outpatient Medications   Medication Sig Dispense Refill  . levonorgestrel-ethinyl estradiol (LYBREL) 90-20 MCG tablet Take 1 tablet by mouth daily. (28) 90 mcg-20 mcg tablet    . levothyroxine (SYNTHROID) 88 MCG tablet Take 1 tablet (88 mcg total) by mouth daily before breakfast. 30 tablet 3   No current facility-administered medications for this visit.    PHYSICAL EXAMINATION: ECOG PERFORMANCE STATUS: 1 - Symptomatic but completely ambulatory  Vitals:   05/04/20 1052  BP: 113/79  Pulse: 80  Resp: 18  Temp: (!) 97.5 F (36.4 C)  SpO2: 100%   Filed Weights   05/04/20 1052  Weight: 209 lb (94.8 kg)    LABORATORY DATA:  I have reviewed the data as listed CMP Latest Ref Rng & Units 04/12/2020 03/23/2020 03/02/2020  Glucose 70 - 99 mg/dL 89 84 87  BUN 6 - 20 mg/dL $Remove'15 17 16  'UaGZXnT$ Creatinine 0.44 - 1.00 mg/dL 0.91 0.97 0.94  Sodium 135 - 145 mmol/L 140 139 138  Potassium 3.5 - 5.1 mmol/L 4.1 3.8 3.9  Chloride 98 - 111 mmol/L 106 103 102  CO2 22 - 32 mmol/L $RemoveB'28 26 24  'cAjaBqXT$ Calcium 8.9 - 10.3 mg/dL 9.2 9.4 9.4  Total Protein 6.5 - 8.1 g/dL 7.6 7.6 7.6  Total Bilirubin 0.3 - 1.2 mg/dL 0.3 0.4 0.4  Alkaline Phos 38 - 126 U/L 79 80 84  AST 15 - 41 U/L $Remo'23 23 24  'OocpX$ ALT 0 - 44 U/L $Remo'18 16 11    'SjoHk$ Lab Results  Component Value Date   WBC 4.9 05/04/2020   HGB 11.6 (  L) 05/04/2020   HCT 35.4 (L) 05/04/2020   MCV 93.2 05/04/2020   PLT 231 05/04/2020   NEUTROABS 2.9 05/04/2020    ASSESSMENT & PLAN:  Malignant neoplasm of overlapping sites of left breast in female, estrogen receptor negative ([place]) 04/25/2019:Patient palpated a left breast and axillary mass x87months. US of the left breast showed a 3.6cm mass at the 1 o'clock position with 6 smaller adjacent masses extending toward the nipple ranging in size from 1.1cm to 4.0cm. US of the left axilla showed five bulky lymph nodes, the largest measuring 4.2cm, and second largest measuring 2.8cm. Biopsy showed IDC in the breast and axilla, grade 3, HER-2 - (1+), ER/PR -, Ki67  90%. T1c N2 stage IIIc Based on PET CT scan showing bilateral cervical nodes, it is stage IV(however her goal is to cure given the oligometastatic nature of her cancer.)  01/10/2020:Bilateral mastectomies with reconstruction (Cornett & Pace): Right breast: no evidence of malignancy Left breast: no evidence of residual carcinoma and 4 benign lymph nodes.  Treatment plan: 1.Adjuvant radiation completed 04/18/2020 2.adjuvantPembrolizumab maintenance therapy   to be completed 07/26/2020  Immune mediated adverse effects: Hypothyroidism,onSynthroid. Liver function tests are normal. Chemo induced anemia: Hemoglobin today is 11.6  Return to clinic every 3 weeks for pembrolizumab.    No orders of the defined types were placed in this encounter.  The patient has a good understanding of the overall plan. she agrees with it. she will call with any problems that may develop before the next visit here.  Total time spent: 30 mins including face to face time and time spent for planning, charting and coordination of care  [name], MD 05/04/2020  I, Cloyde Reams Dorshimer, am acting as scribe for Dr. [name].  I have reviewed the above documentation for accuracy and completeness, and I agree with the above.

## 2020-05-04 ENCOUNTER — Inpatient Hospital Stay: Payer: 59

## 2020-05-04 ENCOUNTER — Other Ambulatory Visit: Payer: Self-pay

## 2020-05-04 ENCOUNTER — Inpatient Hospital Stay: Payer: 59 | Attending: Hematology and Oncology

## 2020-05-04 ENCOUNTER — Inpatient Hospital Stay (HOSPITAL_BASED_OUTPATIENT_CLINIC_OR_DEPARTMENT_OTHER): Payer: 59 | Admitting: Hematology and Oncology

## 2020-05-04 DIAGNOSIS — C50812 Malignant neoplasm of overlapping sites of left female breast: Secondary | ICD-10-CM | POA: Insufficient documentation

## 2020-05-04 DIAGNOSIS — Z171 Estrogen receptor negative status [ER-]: Secondary | ICD-10-CM

## 2020-05-04 DIAGNOSIS — Z5112 Encounter for antineoplastic immunotherapy: Secondary | ICD-10-CM | POA: Insufficient documentation

## 2020-05-04 DIAGNOSIS — E039 Hypothyroidism, unspecified: Secondary | ICD-10-CM | POA: Diagnosis not present

## 2020-05-04 DIAGNOSIS — D6481 Anemia due to antineoplastic chemotherapy: Secondary | ICD-10-CM | POA: Insufficient documentation

## 2020-05-04 DIAGNOSIS — Z95828 Presence of other vascular implants and grafts: Secondary | ICD-10-CM

## 2020-05-04 LAB — CBC WITH DIFFERENTIAL (CANCER CENTER ONLY)
Abs Immature Granulocytes: 0.02 10*3/uL (ref 0.00–0.07)
Basophils Absolute: 0 10*3/uL (ref 0.0–0.1)
Basophils Relative: 1 %
Eosinophils Absolute: 0.3 10*3/uL (ref 0.0–0.5)
Eosinophils Relative: 6 %
HCT: 35.4 % — ABNORMAL LOW (ref 36.0–46.0)
Hemoglobin: 11.6 g/dL — ABNORMAL LOW (ref 12.0–15.0)
Immature Granulocytes: 0 %
Lymphocytes Relative: 28 %
Lymphs Abs: 1.4 10*3/uL (ref 0.7–4.0)
MCH: 30.5 pg (ref 26.0–34.0)
MCHC: 32.8 g/dL (ref 30.0–36.0)
MCV: 93.2 fL (ref 80.0–100.0)
Monocytes Absolute: 0.4 10*3/uL (ref 0.1–1.0)
Monocytes Relative: 7 %
Neutro Abs: 2.9 10*3/uL (ref 1.7–7.7)
Neutrophils Relative %: 58 %
Platelet Count: 231 10*3/uL (ref 150–400)
RBC: 3.8 MIL/uL — ABNORMAL LOW (ref 3.87–5.11)
RDW: 13.4 % (ref 11.5–15.5)
WBC Count: 4.9 10*3/uL (ref 4.0–10.5)
nRBC: 0 % (ref 0.0–0.2)

## 2020-05-04 LAB — CMP (CANCER CENTER ONLY)
ALT: 17 U/L (ref 0–44)
AST: 24 U/L (ref 15–41)
Albumin: 3.9 g/dL (ref 3.5–5.0)
Alkaline Phosphatase: 81 U/L (ref 38–126)
Anion gap: 12 (ref 5–15)
BUN: 14 mg/dL (ref 6–20)
CO2: 25 mmol/L (ref 22–32)
Calcium: 9.4 mg/dL (ref 8.9–10.3)
Chloride: 103 mmol/L (ref 98–111)
Creatinine: 0.96 mg/dL (ref 0.44–1.00)
GFR, Estimated: >60 mL/min (ref >60)
Glucose, Bld: 89 mg/dL (ref 70–99)
Potassium: 4.1 mmol/L (ref 3.5–5.1)
Sodium: 140 mmol/L (ref 135–145)
Total Bilirubin: 0.4 mg/dL (ref 0.3–1.2)
Total Protein: 7.6 g/dL (ref 6.5–8.1)

## 2020-05-04 LAB — TSH: TSH: 76.184 u[IU]/mL — ABNORMAL HIGH (ref 0.308–3.960)

## 2020-05-04 MED ORDER — HEPARIN SOD (PORK) LOCK FLUSH 100 UNIT/ML IV SOLN
500.0000 [IU] | Freq: Once | INTRAVENOUS | Status: AC | PRN
  Administered 2020-05-04: 500 [IU]
  Filled 2020-05-04: qty 5

## 2020-05-04 MED ORDER — SODIUM CHLORIDE 0.9% FLUSH
10.0000 mL | INTRAVENOUS | Status: DC | PRN
  Administered 2020-05-04: 10 mL
  Filled 2020-05-04: qty 10

## 2020-05-04 MED ORDER — SODIUM CHLORIDE 0.9 % IV SOLN
200.0000 mg | Freq: Once | INTRAVENOUS | Status: AC
  Administered 2020-05-04: 200 mg via INTRAVENOUS
  Filled 2020-05-04: qty 8

## 2020-05-04 MED ORDER — SODIUM CHLORIDE 0.9 % IV SOLN
Freq: Once | INTRAVENOUS | Status: AC
  Filled 2020-05-04: qty 250

## 2020-05-04 NOTE — Patient Instructions (Signed)
[facility] Discharge Instructions for Patients Receiving Chemotherapy  Today you received the following chemotherapy agents Pembrolizumab (Keytruda).  To help prevent nausea and vomiting after your treatment, we encourage you to take your nausea medication as prescribed.   If you develop nausea and vomiting that is not controlled by your nausea medication, call the clinic.   BELOW ARE SYMPTOMS THAT SHOULD BE REPORTED IMMEDIATELY:  *FEVER GREATER THAN 100.5 F  *CHILLS WITH OR WITHOUT FEVER  NAUSEA AND VOMITING THAT IS NOT CONTROLLED WITH YOUR NAUSEA MEDICATION  *UNUSUAL SHORTNESS OF BREATH  *UNUSUAL BRUISING OR BLEEDING  TENDERNESS IN MOUTH AND THROAT WITH OR WITHOUT PRESENCE OF ULCERS  *URINARY PROBLEMS  *BOWEL PROBLEMS  UNUSUAL RASH Items with * indicate a potential emergency and should be followed up as soon as possible.  Feel free to call the clinic should you have any questions or concerns. The clinic phone number is [phone].  Please show the CHEMO ALERT CARD at check-in to the Emergency Department and triage nurse.   

## 2020-05-05 LAB — T4: T4, Total: 1.7 ug/dL — ABNORMAL LOW (ref 4.5–12.0)

## 2020-05-10 ENCOUNTER — Telehealth: Payer: Self-pay | Admitting: Hematology and Oncology

## 2020-05-10 NOTE — Telephone Encounter (Signed)
Scheduled per 1/21 los. Pt will receive an updated appt calendar per appt notes

## 2020-05-21 ENCOUNTER — Telehealth: Payer: Self-pay | Admitting: *Deleted

## 2020-05-21 ENCOUNTER — Ambulatory Visit
Admission: RE | Admit: 2020-05-21 | Discharge: 2020-05-21 | Disposition: A | Discharge status: Home / Self Care | Payer: 59 | Source: Ambulatory Visit | Attending: Radiation Oncology | Admitting: Radiation Oncology

## 2020-05-21 NOTE — Progress Notes (Incomplete)
  Radiation Oncology         [phone]) [phone] ________________________________  Name: Heather Mckenzie MRN: [id number]  Date: 05/21/2020  DOB: Aug 21, 1980  Follow-Up Visit Note  CC: Patient, No Pcp Per  [name], MD  No diagnosis found.  Diagnosis: StageIIICLeftBreast, Multifocal Invasive DuctalCarcinoma, ER-/ PR-/ Her2-, Grade3  Interval Since Last Radiation: One month and two days  Radiation Treatment Dates: 02/27/2020 through 04/18/2020  Site / Technique / Total Dose (Gy) / Dose per Fx (Gy) / Completed Fx / Beam Energies Left breast / 3D. A total of 50.4 Gy was given in 1.8 Gy per fraction for 28 fractions. 10X, 15X Left breast SCLV / 3D. A total of 50.4 Gy was given in 1.8 Gy per fraction for 28 fractions. 6X, 10X Left breast boost / 3D. A total of 10 Gy was given in 2 Gy per fraction for 5 fractions. 6E   Narrative:  The patient returns today for routine follow-up. She was last seen by Dr. Lindi Adie on 05/01/2020 and continues on immunotherapy with Pembrolizumab.  On review of systems, she reports ***. She denies ***.  ALLERGIES:  has No Known Allergies.  Meds: Current Outpatient Medications  Medication Sig Dispense Refill  . levonorgestrel-ethinyl estradiol (LYBREL) 90-20 MCG tablet Take 1 tablet by mouth daily. (28) 90 mcg-20 mcg tablet    . levothyroxine (SYNTHROID) 88 MCG tablet Take 1 tablet (88 mcg total) by mouth daily before breakfast. 30 tablet 3   No current facility-administered medications for this encounter.    Physical Findings: The patient is in no acute distress. Patient is alert and oriented.  vitals were not taken for this visit.  No significant changes. Lungs are clear to auscultation bilaterally. Heart has regular rate and rhythm. No palpable cervical, supraclavicular, or axillary adenopathy. Abdomen soft, non-tender, normal bowel sounds. Right breast: No palpable mass, nipple discharge, or bleeding. Left breast: ***  Lab Findings: Lab Results   Component Value Date   WBC 4.9 05/04/2020   HGB 11.6 (L) 05/04/2020   HCT 35.4 (L) 05/04/2020   MCV 93.2 05/04/2020   PLT 231 05/04/2020    Radiographic Findings: No results found.  Impression: StageIIICLeftBreast, Multifocal Invasive DuctalCarcinoma, ER-/ PR-/ Her2-, Grade3  The patient is recovering from the effects of radiation.  ***  Plan: The patient is scheduled to follow up with Dr. Lindi Adie on 05/25/2020. She will follow up with radiation oncology in *** months.  Total time spent in this encounter was *** minutes which included reviewing the patient's most recent follow-up with Dr. Lindi Adie, physical examination, and documentation.  ____________________________________   Blair Promise, PhD, MD  This document serves as a record of services personally performed by [name], MD. It was created on his behalf by Clerance Lav, a trained medical scribe. The creation of this record is based on the scribe's personal observations and the provider's statements to them. This document has been checked and approved by the attending provider.

## 2020-05-21 NOTE — Telephone Encounter (Signed)
CALLED PATIENT TO RESCHEDULE MISSED FU FOR 05-21-20, VM FULL UNABLE TO LEAVE MESSAGE

## 2020-05-21 NOTE — Progress Notes (Incomplete)
Patient Name: Heather Mckenzie MRN: [id number] DOB: 08/25/1980 Referring Physician: [name] (Profile Not Attached) Date of Service: 04/18/2020 [facility], [place]                                                        End Of Treatment Note  Diagnoses: C50.812-Malignant neoplasm of overlapping sites of left female breast  Cancer Staging: StageIIICLeftBreast, Multifocal Invasive DuctalCarcinoma, ER-/ PR-/ Her2-, Grade3  Intent: Curative  Radiation Treatment Dates: 02/27/2020 through 04/18/2020  Site / Technique / Total Dose (Gy) / Dose per Fx (Gy) / Completed Fx / Beam Energies Left breast / 3D. A total of 50.4 Gy was given in 1.8 Gy per fraction for 28 fractions. 10X, 15X Left breast SCLV / 3D. A total of 50.4 Gy was given in 1.8 Gy per fraction for 28 fractions. 6X, 10X Left breast boost / 3D. A total of 10 Gy was given in 2 Gy per fraction for 5 fractions. 6E   Narrative: The patient tolerated radiation therapy relatively well. She did not report any significant radiation-related side effects. She denied fatigue, chest pain, shortness of breath, swelling in the treatment field, and difficulty with range of motion of left shoulder. Throughout treatment, she was noted to have some erythema and hyperpigmentation changes to the left chest area without skin breakdown. Towards the end of treatment, she did develop some dry desquamation but no moist desquamation.  Plan: The patient will follow-up with radiation oncology in one month.  ________________________________________________   Blair Promise, PhD, MD  This document serves as a record of services personally performed by [name], MD. It was created on his behalf by Clerance Lav, a trained medical scribe. The creation of this record is based on the  scribe's personal observations and the provider's statements to them. This document has been checked and approved by the attending provider.

## 2020-05-24 ENCOUNTER — Encounter: Payer: Self-pay | Admitting: *Deleted

## 2020-05-24 NOTE — Assessment & Plan Note (Signed)
04/25/2019:Patient palpated a left breast and axillary mass x56month. [place] the left breast showed a 3.6cm mass at the 1 o'clock position with 6 smaller adjacent masses extending toward the nipple ranging in size from 1.1cm to 4.0cm. [place] the left axilla showed five bulky lymph nodes, the largest measuring 4.2cm, and second largest measuring 2.8cm. 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 T1c N2 stage IIIc Based on PET CT scan showing bilateral cervical nodes, it is stage IV(however her goal is to cure given the oligometastatic nature of her cancer.)  01/10/2020:Bilateral mastectomies with reconstruction ([facility] & P[facility]): Right breast: no evidence of malignancy Left breast: no evidence of residual carcinoma and 4 benign lymph nodes.  Treatment plan: 1.Adjuvant radiation completed 04/18/2020 2.adjuvantPembrolizumab maintenance therapy  to be completed 07/26/2020  Immune mediated adverse effects: Hypothyroidism,onSynthroid. Liver function tests are normal. Chemo induced anemia: Hemoglobin today is 11.6  Return to clinic every 3 weeks for pembrolizumab.

## 2020-05-24 NOTE — Progress Notes (Signed)
 Patient Care Team: Patient, No Pcp Per as PCP - General (General Practice) Martini, Keisha N, RN as Oncology Nurse Navigator Stuart, Dawn C, RN as Oncology Nurse Navigator Cornett, Thomas, MD as Consulting Physician (General Surgery) Maveryck Bahri, MD as Consulting Physician (Hematology and Oncology) Kinard, James, MD as Consulting Physician (Radiation Oncology)  DIAGNOSIS:    ICD-10-CM   1. Malignant neoplasm of overlapping sites of left breast in female, estrogen receptor negative (HCC)  C50.812    Z17.1     SUMMARY OF ONCOLOGIC HISTORY: Oncology History  Malignant neoplasm of overlapping sites of left breast in female, estrogen receptor negative (HCC)  04/25/2019 Initial Diagnosis   Patient palpated a left breast and axillary mass x3months. US of the left breast showed a 3.6cm mass at the 1 o'clock position with 6 smaller adjacent masses extending toward the nipple ranging in size from 1.1cm to 4.0cm. US of the left axilla showed five bulky lymph nodes, the largest measuring 4.2cm, and second largest measuring 2.8cm. 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    04/27/2019 Cancer Staging   Staging form: Breast, AJCC 8th Edition - Clinical: Stage IIIC (cT1c, cN2a, cM0, G3, ER-, PR-, HER2-) - Signed by Jamear Carbonneau, MD on 04/27/2019   05/12/2019 - 10/27/2019 Chemotherapy   The patient had DOXOrubicin (ADRIAMYCIN) chemo injection 128 mg, 60 mg/m2 = 128 mg, Intravenous,  Once, 4 of 4 cycles Administration: 128 mg (05/12/2019), 128 mg (06/03/2019), 128 mg (06/23/2019), 128 mg (07/14/2019) palonosetron (ALOXI) injection 0.25 mg, 0.25 mg, Intravenous,  Once, 8 of 8 cycles Administration: 0.25 mg (05/12/2019), 0.25 mg (08/04/2019), 0.25 mg (06/03/2019), 0.25 mg (09/01/2019), 0.25 mg (06/23/2019), 0.25 mg (07/14/2019), 0.25 mg (09/22/2019), 0.25 mg (10/13/2019) pegfilgrastim-cbqv (UDENYCA) injection 6 mg, 6 mg, Subcutaneous, Once, 4 of 4 cycles Administration: 6 mg  (05/14/2019), 6 mg (06/06/2019), 6 mg (06/25/2019), 6 mg (07/16/2019) CARBOplatin (PARAPLATIN) 700 mg in sodium chloride 0.9 % 250 mL chemo infusion, 700 mg (100 % of original dose 700 mg), Intravenous,  Once, 4 of 4 cycles Dose modification: 700 mg (original dose 700 mg, Cycle 5) Administration: 700 mg (08/04/2019), 700 mg (09/01/2019), 700 mg (09/22/2019), 700 mg (10/13/2019) cyclophosphamide (CYTOXAN) 1,280 mg in sodium chloride 0.9 % 250 mL chemo infusion, 600 mg/m2 = 1,280 mg, Intravenous,  Once, 4 of 4 cycles Administration: 1,280 mg (05/12/2019), 1,280 mg (06/03/2019), 1,280 mg (06/23/2019), 1,280 mg (07/14/2019) PACLitaxel (TAXOL) 174 mg in sodium chloride 0.9 % 250 mL chemo infusion (</= 80mg/m2), 80 mg/m2 = 174 mg, Intravenous,  Once, 4 of 4 cycles Dose modification: 65 mg/m2 (original dose 80 mg/m2, Cycle 6, Reason: Dose not tolerated) Administration: 174 mg (08/04/2019), 174 mg (08/11/2019), 138 mg (09/01/2019), 138 mg (08/25/2019), 138 mg (09/08/2019), 138 mg (09/15/2019), 138 mg (09/22/2019), 138 mg (09/29/2019), 138 mg (10/07/2019), 138 mg (10/13/2019), 138 mg (10/21/2019), 138 mg (10/27/2019) fosaprepitant (EMEND) 150 mg in sodium chloride 0.9 % 145 mL IVPB, 150 mg, Intravenous,  Once, 8 of 8 cycles Administration: 150 mg (05/12/2019), 150 mg (08/04/2019), 150 mg (06/03/2019), 150 mg (09/01/2019), 150 mg (06/23/2019), 150 mg (07/14/2019), 150 mg (09/22/2019), 150 mg (10/13/2019) pembrolizumab (KEYTRUDA) 200 mg in sodium chloride 0.9 % 50 mL chemo infusion, 2 mg/kg = 200 mg, Intravenous, Once, 8 of 8 cycles Administration: 200 mg (05/12/2019), 200 mg (06/03/2019), 200 mg (06/23/2019), 200 mg (07/14/2019), 200 mg (08/04/2019), 200 mg (09/01/2019), 200 mg (09/22/2019), 200 mg (10/13/2019)  for chemotherapy treatment.       Genetic Testing   Negative genetic testing. No pathogenic variants identified. VUS in HOXB13 called c.473C>A, VUS in POLD1 called c.1610G>C, and VUS in RAD50 called c.1094G>A identified on the [facility] Common Hereditary  Cancers Panel. The report date is 05/10/2019.  The Common Hereditary Cancers Panel offered by [facility] includes sequencing and/or deletion duplication testing of the following 48 genes: APC, ATM, AXIN2, BARD1, BMPR1A, BRCA1, BRCA2, BRIP1, CDH1, CDKN2A (p14ARF), CDKN2A (p16INK4a), CKD4, CHEK2, CTNNA1, DICER1, EPCAM (Deletion/duplication testing only), GREM1 (promoter region deletion/duplication testing only), KIT, MEN1, MLH1, MSH2, MSH3, MSH6, MUTYH, NBN, NF1, NHTL1, PALB2, PDGFRA, PMS2, POLD1, POLE, PTEN, RAD50, RAD51C, RAD51D, RNF43, SDHB, SDHC, SDHD, SMAD4, SMARCA4. STK11, TP53, TSC1, TSC2, and VHL.  The following genes were evaluated for sequence changes only: SDHA and HOXB13 c.251G>A variant only.   01/10/2020 Surgery   Bilateral mastectomies with reconstruction ([facility] Pace): Right breast: no evidence tof malignancy Left breast: no evidence of residual carcinoma and 4 benign lymph nodes.    02/10/2020 -  Chemotherapy    Patient is on Treatment Plan: BREAST PEMBROLIZUMAB Q21D      02/28/2020 -  Radiation Therapy   Adjuvant radiation     CHIEF COMPLIANT: Follow-up on immunotherapy with pembrolizumab  INTERVAL HISTORY: Heather Mckenzie is a 40 y.o. with above-mentioned history of triple negative breast cancer who underwent neoadjuvant chemotherapy and had a complete pathologic response and is currently on maintenance therapy with pembrolizumab. She presents to the clinic today for treatment.    ALLERGIES:  has No Known Allergies.  MEDICATIONS:  Current Outpatient Medications  Medication Sig Dispense Refill  . levonorgestrel-ethinyl estradiol (LYBREL) 90-20 MCG tablet Take 1 tablet by mouth daily. (28) 90 mcg-20 mcg tablet    . levothyroxine (SYNTHROID) 88 MCG tablet Take 1 tablet (88 mcg total) by mouth daily before breakfast. 30 tablet 3   No current facility-administered medications for this visit.    PHYSICAL EXAMINATION: ECOG PERFORMANCE STATUS: 1 - Symptomatic but completely  ambulatory  Vitals:   05/25/20 0947  BP: 119/74  Pulse: 78  Resp: 18  Temp: 97.7 F (36.5 C)  SpO2: 100%   Filed Weights   05/25/20 0947  Weight: 213 lb 1.6 oz (96.7 kg)    LABORATORY DATA:  I have reviewed the data as listed CMP Latest Ref Rng & Units 05/04/2020 04/12/2020 03/23/2020  Glucose 70 - 99 mg/dL 89 89 84  BUN 6 - 20 mg/dL $Remove'14 15 17  'NimOBpe$ Creatinine 0.44 - 1.00 mg/dL 0.96 0.91 0.97  Sodium 135 - 145 mmol/L 140 140 139  Potassium 3.5 - 5.1 mmol/L 4.1 4.1 3.8  Chloride 98 - 111 mmol/L 103 106 103  CO2 22 - 32 mmol/L $RemoveB'25 28 26  'dZVXOloa$ Calcium 8.9 - 10.3 mg/dL 9.4 9.2 9.4  Total Protein 6.5 - 8.1 g/dL 7.6 7.6 7.6  Total Bilirubin 0.3 - 1.2 mg/dL 0.4 0.3 0.4  Alkaline Phos 38 - 126 U/L 81 79 80  AST 15 - 41 U/L $Remo'24 23 23  'mXLUe$ ALT 0 - 44 U/L $Remo'17 18 16    'atjuk$ Lab Results  Component Value Date   WBC 4.9 05/04/2020   HGB 11.6 (L) 05/04/2020   HCT 35.4 (L) 05/04/2020   MCV 93.2 05/04/2020   PLT 231 05/04/2020   NEUTROABS 2.9 05/04/2020    ASSESSMENT & PLAN:  Malignant neoplasm of overlapping sites of left breast in female, estrogen receptor negative ([place]) 04/25/2019:Patient palpated a left breast and axillary mass x12months. US of the left breast showed a 3.6cm  mass at the 1 o'clock position with 6 smaller adjacent masses extending toward the nipple ranging in size from 1.1cm to 4.0cm. US of the left axilla showed five bulky lymph nodes, the largest measuring 4.2cm, and second largest measuring 2.8cm. 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 T1c N2 stage IIIc Based on PET CT scan showing bilateral cervical nodes, it is stage IV(however her goal is to cure given the oligometastatic nature of her cancer.)  01/10/2020:Bilateral mastectomies with reconstruction ([facility]): Right breast: no evidence of malignancy Left breast: no evidence of residual carcinoma and 4 benign lymph nodes.  Treatment plan: 1.Adjuvant radiation completed  04/18/2020 2.adjuvantPembrolizumab maintenance therapy  to be completed 07/26/2020  Immune mediated adverse effects: Hypothyroidism,onSynthroid. Liver function tests are normal. Chemo induced anemia: Hemoglobin today is 11.6  Return to clinic every 3 weeks for pembrolizumab.    No orders of the defined types were placed in this encounter.  The patient has a good understanding of the overall plan. she agrees with it. she will call with any problems that may develop before the next visit here.  Total time spent: 30 mins including face to face time and time spent for planning, charting and coordination of care  Rulon Eisenmenger, MD, MPH 05/25/2020  I, Molly Dorshimer, am acting as scribe for Dr. [name].  I have reviewed the above documentation for accuracy and completeness, and I agree with the above.

## 2020-05-25 ENCOUNTER — Inpatient Hospital Stay: Payer: 59

## 2020-05-25 ENCOUNTER — Inpatient Hospital Stay: Payer: 59 | Attending: Hematology and Oncology

## 2020-05-25 ENCOUNTER — Other Ambulatory Visit: Payer: Self-pay

## 2020-05-25 ENCOUNTER — Inpatient Hospital Stay (HOSPITAL_BASED_OUTPATIENT_CLINIC_OR_DEPARTMENT_OTHER): Payer: 59 | Admitting: Hematology and Oncology

## 2020-05-25 VITALS — BP 119/74 | HR 78 | Temp 97.7°F | Resp 18 | Ht 63.0 in | Wt 213.1 lb

## 2020-05-25 DIAGNOSIS — Z79899 Other long term (current) drug therapy: Secondary | ICD-10-CM | POA: Diagnosis not present

## 2020-05-25 DIAGNOSIS — C50812 Malignant neoplasm of overlapping sites of left female breast: Secondary | ICD-10-CM | POA: Insufficient documentation

## 2020-05-25 DIAGNOSIS — Z5112 Encounter for antineoplastic immunotherapy: Secondary | ICD-10-CM | POA: Diagnosis not present

## 2020-05-25 DIAGNOSIS — D6481 Anemia due to antineoplastic chemotherapy: Secondary | ICD-10-CM | POA: Insufficient documentation

## 2020-05-25 DIAGNOSIS — Z171 Estrogen receptor negative status [ER-]: Secondary | ICD-10-CM

## 2020-05-25 DIAGNOSIS — E039 Hypothyroidism, unspecified: Secondary | ICD-10-CM

## 2020-05-25 DIAGNOSIS — Z95828 Presence of other vascular implants and grafts: Secondary | ICD-10-CM

## 2020-05-25 LAB — CMP (CANCER CENTER ONLY)
ALT: 14 U/L (ref 0–44)
AST: 22 U/L (ref 15–41)
Albumin: 3.9 g/dL (ref 3.5–5.0)
Alkaline Phosphatase: 80 U/L (ref 38–126)
Anion gap: 9 (ref 5–15)
BUN: 21 mg/dL — ABNORMAL HIGH (ref 6–20)
CO2: 25 mmol/L (ref 22–32)
Calcium: 9 mg/dL (ref 8.9–10.3)
Chloride: 104 mmol/L (ref 98–111)
Creatinine: 0.95 mg/dL (ref 0.44–1.00)
GFR, Estimated: >60 mL/min (ref >60)
Glucose, Bld: 94 mg/dL (ref 70–99)
Potassium: 3.9 mmol/L (ref 3.5–5.1)
Sodium: 138 mmol/L (ref 135–145)
Total Bilirubin: 0.4 mg/dL (ref 0.3–1.2)
Total Protein: 7.5 g/dL (ref 6.5–8.1)

## 2020-05-25 LAB — CBC WITH DIFFERENTIAL (CANCER CENTER ONLY)
Abs Immature Granulocytes: 0.01 10*3/uL (ref 0.00–0.07)
Basophils Absolute: 0.1 10*3/uL (ref 0.0–0.1)
Basophils Relative: 1 %
Eosinophils Absolute: 0.2 10*3/uL (ref 0.0–0.5)
Eosinophils Relative: 3 %
HCT: 33.4 % — ABNORMAL LOW (ref 36.0–46.0)
Hemoglobin: 11.3 g/dL — ABNORMAL LOW (ref 12.0–15.0)
Immature Granulocytes: 0 %
Lymphocytes Relative: 22 %
Lymphs Abs: 1.3 10*3/uL (ref 0.7–4.0)
MCH: 31.4 pg (ref 26.0–34.0)
MCHC: 33.8 g/dL (ref 30.0–36.0)
MCV: 92.8 fL (ref 80.0–100.0)
Monocytes Absolute: 0.4 10*3/uL (ref 0.1–1.0)
Monocytes Relative: 7 %
Neutro Abs: 3.9 10*3/uL (ref 1.7–7.7)
Neutrophils Relative %: 67 %
Platelet Count: 246 10*3/uL (ref 150–400)
RBC: 3.6 MIL/uL — ABNORMAL LOW (ref 3.87–5.11)
RDW: 13.7 % (ref 11.5–15.5)
WBC Count: 5.8 10*3/uL (ref 4.0–10.5)
nRBC: 0 % (ref 0.0–0.2)

## 2020-05-25 LAB — TSH: TSH: 43.898 u[IU]/mL — ABNORMAL HIGH (ref 0.308–3.960)

## 2020-05-25 MED ORDER — HEPARIN SOD (PORK) LOCK FLUSH 100 UNIT/ML IV SOLN
500.0000 [IU] | Freq: Once | INTRAVENOUS | Status: AC | PRN
  Administered 2020-05-25: 500 [IU]
  Filled 2020-05-25: qty 5

## 2020-05-25 MED ORDER — SODIUM CHLORIDE 0.9% FLUSH
10.0000 mL | INTRAVENOUS | Status: DC | PRN
Start: 2020-05-25 — End: 2020-05-25
  Administered 2020-05-25: 10 mL
  Filled 2020-05-25: qty 10

## 2020-05-25 MED ORDER — SODIUM CHLORIDE 0.9 % IV SOLN
200.0000 mg | Freq: Once | INTRAVENOUS | Status: AC
  Administered 2020-05-25: 200 mg via INTRAVENOUS
  Filled 2020-05-25: qty 8

## 2020-05-25 MED ORDER — SODIUM CHLORIDE 0.9 % IV SOLN
Freq: Once | INTRAVENOUS | Status: AC
  Filled 2020-05-25: qty 250

## 2020-05-25 MED ORDER — LEVOTHYROXINE SODIUM 88 MCG PO TABS: 88.0000 ug | ORAL_TABLET | Freq: Every day | ORAL | 3 refills | Status: DC

## 2020-05-25 NOTE — Patient Instructions (Signed)
[facility] Discharge Instructions for Patients Receiving Chemotherapy  Today you received the following chemotherapy agents:  Keytruda.  To help prevent nausea and vomiting after your treatment, we encourage you to take your nausea medication as directed.   If you develop nausea and vomiting that is not controlled by your nausea medication, call the clinic.   BELOW ARE SYMPTOMS THAT SHOULD BE REPORTED IMMEDIATELY:  *FEVER GREATER THAN 100.5 F  *CHILLS WITH OR WITHOUT FEVER  NAUSEA AND VOMITING THAT IS NOT CONTROLLED WITH YOUR NAUSEA MEDICATION  *UNUSUAL SHORTNESS OF BREATH  *UNUSUAL BRUISING OR BLEEDING  TENDERNESS IN MOUTH AND THROAT WITH OR WITHOUT PRESENCE OF ULCERS  *URINARY PROBLEMS  *BOWEL PROBLEMS  UNUSUAL RASH Items with * indicate a potential emergency and should be followed up as soon as possible.  Feel free to call the clinic should you have any questions or concerns. The clinic phone number is [phone].  Please show the CHEMO ALERT CARD at check-in to the Emergency Department and triage nurse.    

## 2020-05-26 LAB — T4: T4, Total: 1.8 ug/dL — ABNORMAL LOW (ref 4.5–12.0)

## 2020-05-31 ENCOUNTER — Ambulatory Visit
Admission: RE | Admit: 2020-05-31 | Discharge: 2020-05-31 | Disposition: A | Discharge status: Home / Self Care | Payer: 59 | Source: Ambulatory Visit | Attending: Radiation Oncology | Admitting: Radiation Oncology

## 2020-05-31 ENCOUNTER — Encounter: Payer: Self-pay | Admitting: Radiation Oncology

## 2020-05-31 ENCOUNTER — Other Ambulatory Visit: Payer: Self-pay

## 2020-05-31 VITALS — BP 123/85 | HR 75 | Temp 96.9°F | Resp 18 | Ht 63.0 in | Wt 214.0 lb

## 2020-05-31 DIAGNOSIS — Z923 Personal history of irradiation: Secondary | ICD-10-CM | POA: Diagnosis not present

## 2020-05-31 DIAGNOSIS — Z171 Estrogen receptor negative status [ER-]: Secondary | ICD-10-CM | POA: Diagnosis not present

## 2020-05-31 DIAGNOSIS — C50812 Malignant neoplasm of overlapping sites of left female breast: Secondary | ICD-10-CM | POA: Insufficient documentation

## 2020-05-31 DIAGNOSIS — Z793 Long term (current) use of hormonal contraceptives: Secondary | ICD-10-CM | POA: Diagnosis not present

## 2020-05-31 DIAGNOSIS — Z79899 Other long term (current) drug therapy: Secondary | ICD-10-CM | POA: Insufficient documentation

## 2020-05-31 HISTORY — DX: Malignant neoplasm of unspecified site of unspecified female breast: C50.919

## 2020-05-31 NOTE — Progress Notes (Signed)
Weight and vital stable. Denies pain. Endorses taking immunotherapy without difficulty. Denies palpating any abnormalities in her left or right breast. Denies nipple discharge in either breast. Reports skin of left/treated breast has returned to normal color and appearance. Demonstrates full ROM of both upper extremities. No evidence of lymphedema noted. Denies a change in her energy level.   BP 123/85 (BP Location: Right Arm, Patient Position: Sitting)   Pulse 75   Temp (!) 96.9 F (36.1 C) (Temporal)   Resp 18   Ht 5\' 3"  (1.6 m)   Wt 214 lb (97.1 kg)   SpO2 100%   BMI 37.91 kg/m  Wt Readings from Last 3 Encounters:  05/31/20 214 lb (97.1 kg)  05/25/20 213 lb 1.6 oz (96.7 kg)  05/04/20 209 lb (94.8 kg)

## 2020-05-31 NOTE — Progress Notes (Signed)
Radiation Oncology         [phone] ________________________________  Name: Heather Mckenzie MRN: [id number]  Date: 05/31/2020  DOB: Aug 31, 1980  Follow-Up Visit Note  CC: Patient, No Pcp Per  [name], MD    ICD-10-CM   1. Malignant neoplasm of overlapping sites of left breast in female, estrogen receptor negative ([place])  C50.812    Z17.1     Diagnosis: StageIIICLeftBreast, Multifocal Invasive DuctalCarcinoma, ER-/ PR-/ Her2-, Grade3  Interval Since Last Radiation: One month, one week, and five days  Radiation Treatment Dates: 02/27/2020 through 04/18/2020  Site / Technique / Total Dose (Gy) / Dose per Fx (Gy) / Completed Fx / Beam Energies Left breast / 3D. A total of 50.4 Gy was given in 1.8 Gy per fraction for 28 fractions. 10X, 15X Left breast SCLV / 3D. A total of 50.4 Gy was given in 1.8 Gy per fraction for 28 fractions. 6X, 10X Left breast boost / 3D. A total of 10 Gy was given in 2 Gy per fraction for 5 fractions. 6E   Narrative:  The patient returns today for routine follow-up. She was last seen by Dr. Lindi Adie on 05/25/2020 and continues on immunotherapy with Pembrolizumab.  On review of systems, she reports recovered from her radiation therapy.  She denies any significant fatigue at this time. She denies itching or discomfort along the left chest wall area.  She denies any problems with swelling in her left arm or hand..  ALLERGIES:  has No Known Allergies.  Meds: Current Outpatient Medications  Medication Sig Dispense Refill  . levonorgestrel-ethinyl estradiol (LYBREL) 90-20 MCG tablet Take 1 tablet by mouth daily. (28) 90 mcg-20 mcg tablet    . levothyroxine (SYNTHROID) 88 MCG tablet Take 1 tablet (88 mcg total) by mouth daily before breakfast. 30 tablet 3   No current facility-administered medications for this encounter.    Physical Findings: The patient is in no acute distress. Patient is alert and oriented.  height is $RemoveB'5\' 3"'ixRleXnV$  (1.6 m) and weight is  214 lb (97.1 kg). Her temporal temperature is 96.9 F (36.1 C) (abnormal). Her blood pressure is 123/85 and her pulse is 75. Her respiration is 18 and oxygen saturation is 100%.   Lungs are clear to auscultation bilaterally. Heart has regular rate and rhythm. No palpable cervical, supraclavicular, or axillary adenopathy. Abdomen soft, non-tender, normal bowel sounds. Right chest shows a tissue expander in place. Left chest shows a tissue expander in place.  skin is healed well.  Mild hyperpigmentation changes noted  Lab Findings: Lab Results  Component Value Date   WBC 5.8 05/25/2020   HGB 11.3 (L) 05/25/2020   HCT 33.4 (L) 05/25/2020   MCV 92.8 05/25/2020   PLT 246 05/25/2020    Radiographic Findings: No results found.  Impression: StageIIICLeftBreast, Multifocal Invasive DuctalCarcinoma, ER-/ PR-/ Her2-, Grade3  The patient has recovered well from her radiation therapy.  She will continue on immunotherapy.  Plan: The patient is scheduled to follow up with Dr. Lindi Adie on 06/15/2020. She will follow up with radiation oncology on an as needed basis in light of her close follow-up with medical oncology.  Patient will eventually see Dr. Claudia Desanctis for additional cosmetic surgery.    ____________________________________   Blair Promise, PhD, MD  This document serves as a record of services personally performed by [name], MD. It was created on his behalf by Clerance Lav, a trained medical scribe. The creation of this record is based on the scribe's personal  observations and the provider's statements to them. This document has been checked and approved by the attending provider.

## 2020-06-08 ENCOUNTER — Telehealth: Payer: Self-pay

## 2020-06-08 NOTE — Telephone Encounter (Signed)
Patient called to say that she has finished her radiation treatments and her radiation doctor has released her.  Patient said that everything has healed nicely. She said that she is now interested in coming in for a fill and wants to be sure that this is ok.  Patient said she is currently filling a "B" cup and her goal is a "C" cup.  Please call.

## 2020-06-11 NOTE — Telephone Encounter (Signed)
Called and spoke with the patient on (06/08/20) regarding the message below.  Informed the patient that I spoke with Johanna,PA-C,and she stated that it would be best if we check with Dr. Claudia Desanctis to see what he thinks.  Informed the patient that want be until Monday.  Patient verbalized understanding and agreed.//AB/CMA

## 2020-06-11 NOTE — Telephone Encounter (Signed)
Called the patient and informed her that I spoke with Dr. Claudia Desanctis regarding her message.  He stated for her to follow-up in 3-4 months, and he can decide then if she will be able to do a fill because of the radiation.  Patient verbalized understanding and agreed.    Patient was scheduled for (June 1,2022 @ 9:30am with Dr. Claudia Desanctis).  Patient agreed//AB/CMA

## 2020-06-14 NOTE — Assessment & Plan Note (Signed)
04/25/2019:Patient palpated a left breast and axillary mass x82month. [place] the left breast showed a 3.6cm mass at the 1 o'clock position with 6 smaller adjacent masses extending toward the nipple ranging in size from 1.1cm to 4.0cm. [place] the left axilla showed five bulky lymph nodes, the largest measuring 4.2cm, and second largest measuring 2.8cm. 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 T1c N2 stage IIIc Based on PET CT scan showing bilateral cervical nodes, it is stage IV(however her goal is to cure given the oligometastatic nature of her cancer.)  01/10/2020:Bilateral mastectomies with reconstruction ([facility]): Right breast: no evidence of malignancy Left breast: no evidence of residual carcinoma and 4 benign lymph nodes.  Treatment plan: 1.Adjuvant radiationcompleted 04/18/2020 2.adjuvantPembrolizumab maintenance therapyto be completed 07/26/2020  Immune mediated adverse effects: Hypothyroidism,onSynthroid. Liver function tests are normal. Chemo induced anemia: Hemoglobin today is11.6  Return to clinic every 3 weeks for pembrolizumab.

## 2020-06-14 NOTE — Progress Notes (Signed)
 Patient Care Team: Patient, No Pcp Per as PCP - General (General Practice) Mckenzie, Heather N, RN as Oncology Nurse Navigator Mckenzie, Heather C, RN as Oncology Nurse Navigator Mckenzie, Thomas, MD as Consulting Physician (General Surgery) Heather Blando, MD as Consulting Physician (Hematology and Oncology) Mckenzie, James, MD as Consulting Physician (Radiation Oncology)  DIAGNOSIS:    ICD-10-CM   1. Malignant neoplasm of overlapping sites of left breast in female, estrogen receptor negative (HCC)  C50.812    Z17.1     SUMMARY OF ONCOLOGIC HISTORY: Oncology History  Malignant neoplasm of overlapping sites of left breast in female, estrogen receptor negative (HCC)  04/25/2019 Initial Diagnosis   Patient palpated a left breast and axillary mass x3months. US of the left breast showed a 3.6cm mass at the 1 o'clock position with 6 smaller adjacent masses extending toward the nipple ranging in size from 1.1cm to 4.0cm. US of the left axilla showed five bulky lymph nodes, the largest measuring 4.2cm, and second largest measuring 2.8cm. 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    04/27/2019 Cancer Staging   Staging form: Breast, AJCC 8th Edition - Clinical: Stage IIIC (cT1c, cN2a, cM0, G3, ER-, PR-, HER2-) - Signed by Britain Anagnos, MD on 04/27/2019   05/12/2019 - 10/27/2019 Chemotherapy   The patient had DOXOrubicin (ADRIAMYCIN) chemo injection 128 mg, 60 mg/m2 = 128 mg, Intravenous,  Once, 4 of 4 cycles Administration: 128 mg (05/12/2019), 128 mg (06/03/2019), 128 mg (06/23/2019), 128 mg (07/14/2019) palonosetron (ALOXI) injection 0.25 mg, 0.25 mg, Intravenous,  Once, 8 of 8 cycles Administration: 0.25 mg (05/12/2019), 0.25 mg (08/04/2019), 0.25 mg (06/03/2019), 0.25 mg (09/01/2019), 0.25 mg (06/23/2019), 0.25 mg (07/14/2019), 0.25 mg (09/22/2019), 0.25 mg (10/13/2019) pegfilgrastim-cbqv (UDENYCA) injection 6 mg, 6 mg, Subcutaneous, Once, 4 of 4 cycles Administration: 6 mg  (05/14/2019), 6 mg (06/06/2019), 6 mg (06/25/2019), 6 mg (07/16/2019) CARBOplatin (PARAPLATIN) 700 mg in sodium chloride 0.9 % 250 mL chemo infusion, 700 mg (100 % of original dose 700 mg), Intravenous,  Once, 4 of 4 cycles Dose modification: 700 mg (original dose 700 mg, Cycle 5) Administration: 700 mg (08/04/2019), 700 mg (09/01/2019), 700 mg (09/22/2019), 700 mg (10/13/2019) cyclophosphamide (CYTOXAN) 1,280 mg in sodium chloride 0.9 % 250 mL chemo infusion, 600 mg/m2 = 1,280 mg, Intravenous,  Once, 4 of 4 cycles Administration: 1,280 mg (05/12/2019), 1,280 mg (06/03/2019), 1,280 mg (06/23/2019), 1,280 mg (07/14/2019) PACLitaxel (TAXOL) 174 mg in sodium chloride 0.9 % 250 mL chemo infusion (</= 80mg/m2), 80 mg/m2 = 174 mg, Intravenous,  Once, 4 of 4 cycles Dose modification: 65 mg/m2 (original dose 80 mg/m2, Cycle 6, Reason: Dose not tolerated) Administration: 174 mg (08/04/2019), 174 mg (08/11/2019), 138 mg (09/01/2019), 138 mg (08/25/2019), 138 mg (09/08/2019), 138 mg (09/15/2019), 138 mg (09/22/2019), 138 mg (09/29/2019), 138 mg (10/07/2019), 138 mg (10/13/2019), 138 mg (10/21/2019), 138 mg (10/27/2019) fosaprepitant (EMEND) 150 mg in sodium chloride 0.9 % 145 mL IVPB, 150 mg, Intravenous,  Once, 8 of 8 cycles Administration: 150 mg (05/12/2019), 150 mg (08/04/2019), 150 mg (06/03/2019), 150 mg (09/01/2019), 150 mg (06/23/2019), 150 mg (07/14/2019), 150 mg (09/22/2019), 150 mg (10/13/2019) pembrolizumab (KEYTRUDA) 200 mg in sodium chloride 0.9 % 50 mL chemo infusion, 2 mg/kg = 200 mg, Intravenous, Once, 8 of 8 cycles Administration: 200 mg (05/12/2019), 200 mg (06/03/2019), 200 mg (06/23/2019), 200 mg (07/14/2019), 200 mg (08/04/2019), 200 mg (09/01/2019), 200 mg (09/22/2019), 200 mg (10/13/2019)  for chemotherapy treatment.       Genetic Testing   Negative genetic testing. No pathogenic variants identified. VUS in HOXB13 called c.473C>A, VUS in POLD1 called c.1610G>C, and VUS in RAD50 called c.1094G>A identified on the [facility] Common Hereditary  Cancers Panel. The report date is 05/10/2019.  The Common Hereditary Cancers Panel offered by [facility] includes sequencing and/or deletion duplication testing of the following 48 genes: APC, ATM, AXIN2, BARD1, BMPR1A, BRCA1, BRCA2, BRIP1, CDH1, CDKN2A (p14ARF), CDKN2A (p16INK4a), CKD4, CHEK2, CTNNA1, DICER1, EPCAM (Deletion/duplication testing only), GREM1 (promoter region deletion/duplication testing only), KIT, MEN1, MLH1, MSH2, MSH3, MSH6, MUTYH, NBN, NF1, NHTL1, PALB2, PDGFRA, PMS2, POLD1, POLE, PTEN, RAD50, RAD51C, RAD51D, RNF43, SDHB, SDHC, SDHD, SMAD4, SMARCA4. STK11, TP53, TSC1, TSC2, and VHL.  The following genes were evaluated for sequence changes only: SDHA and HOXB13 c.251G>A variant only.   01/10/2020 Surgery   Bilateral mastectomies with reconstruction ([facility]ckenzie & Pace): Right breast: no evidence tof malignancy Left breast: no evidence of residual carcinoma and 4 benign lymph nodes.    02/10/2020 -  Chemotherapy    Patient is on Treatment Plan: BREAST PEMBROLIZUMAB Q21D      02/28/2020 -  Radiation Therapy   Adjuvant radiation     CHIEF COMPLIANT: Follow-up on immunotherapy with pembrolizumab  INTERVAL HISTORY: Heather Mckenzie is a 40 y.o. with above-mentioned history of triple negative breast cancer who underwent neoadjuvant chemotherapy and had a complete pathologic response and is currently on maintenance therapy with pembrolizumab.She presents to the clinic todayfor treatment.   ALLERGIES:  has No Known Allergies.  MEDICATIONS:  Current Outpatient Medications  Medication Sig Dispense Refill  . levonorgestrel-ethinyl estradiol (LYBREL) 90-20 MCG tablet Take 1 tablet by mouth daily. (28) 90 mcg-20 mcg tablet    . levothyroxine (SYNTHROID) 88 MCG tablet Take 1 tablet (88 mcg total) by mouth daily before breakfast. 30 tablet 3   No current facility-administered medications for this visit.    PHYSICAL EXAMINATION: ECOG PERFORMANCE STATUS: 1 - Symptomatic but completely  ambulatory  There were no vitals filed for this visit. There were no vitals filed for this visit.  LABORATORY DATA:  I have reviewed the data as listed CMP Latest Ref Rng & Units 05/25/2020 05/04/2020 04/12/2020  Glucose 70 - 99 mg/dL 94 89 89  BUN 6 - 20 mg/dL 21(H) 14 15  Creatinine 0.44 - 1.00 mg/dL 0.95 0.96 0.91  Sodium 135 - 145 mmol/L 138 140 140  Potassium 3.5 - 5.1 mmol/L 3.9 4.1 4.1  Chloride 98 - 111 mmol/L 104 103 106  CO2 22 - 32 mmol/L $RemoveB'25 25 28  'eJqXmeNc$ Calcium 8.9 - 10.3 mg/dL 9.0 9.4 9.2  Total Protein 6.5 - 8.1 g/dL 7.5 7.6 7.6  Total Bilirubin 0.3 - 1.2 mg/dL 0.4 0.4 0.3  Alkaline Phos 38 - 126 U/L 80 81 79  AST 15 - 41 U/L $Remo'22 24 23  'XMIGJ$ ALT 0 - 44 U/L $Remo'14 17 18    'UkPAW$ Lab Results  Component Value Date   WBC 5.8 05/25/2020   HGB 11.3 (L) 05/25/2020   HCT 33.4 (L) 05/25/2020   MCV 92.8 05/25/2020   PLT 246 05/25/2020   NEUTROABS 3.9 05/25/2020    ASSESSMENT & PLAN:  Malignant neoplasm of overlapping sites of left breast in female, estrogen receptor negative ([place]) 04/25/2019:Patient palpated a left breast and axillary mass x42months. US of the left breast showed a 3.6cm mass at the 1 o'clock position with 6 smaller adjacent masses extending toward the nipple ranging in size from 1.1cm to 4.0cm. US of the left axilla showed  five bulky lymph nodes, the largest measuring 4.2cm, and second largest measuring 2.8cm. 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 T1c N2 stage IIIc Based on PET CT scan showing bilateral cervical nodes, it is stage IV(however her goal is to cure given the oligometastatic nature of her cancer.)  01/10/2020:Bilateral mastectomies with reconstruction ([facility]): Right breast: no evidence of malignancy Left breast: no evidence of residual carcinoma and 4 benign lymph nodes.  Treatment plan: 1.Adjuvant radiationcompleted 04/18/2020 2.adjuvantPembrolizumab maintenance therapy( be completed  07/26/2020)  Immune mediated adverse effects: Hypothyroidism,onSynthroid. Liver function tests are normal. Chemo induced anemia: Labs are not out today. Patient plans to switch out her birth control pills and have copper IUD placed.  I sent a message to Dr. [name] to get the port removed after 07/27/2020. Return to clinic every 3 weeks for pembrolizumab.    No orders of the defined types were placed in this encounter.  The patient has a good understanding of the overall plan. she agrees with it. she will call with any problems that may develop before the next visit here.  Total time spent: 30 mins including face to face time and time spent for planning, charting and coordination of care  Rulon Eisenmenger, MD, MPH 06/15/2020  I, Cloyde Reams Dorshimer, am acting as scribe for Dr. [name].  I have reviewed the above documentation for accuracy and completeness, and I agree with the above.

## 2020-06-15 ENCOUNTER — Inpatient Hospital Stay (HOSPITAL_BASED_OUTPATIENT_CLINIC_OR_DEPARTMENT_OTHER): Payer: 59 | Admitting: Hematology and Oncology

## 2020-06-15 ENCOUNTER — Other Ambulatory Visit: Payer: Self-pay

## 2020-06-15 ENCOUNTER — Inpatient Hospital Stay: Payer: 59

## 2020-06-15 ENCOUNTER — Inpatient Hospital Stay: Payer: 59 | Attending: Hematology and Oncology

## 2020-06-15 DIAGNOSIS — Z5112 Encounter for antineoplastic immunotherapy: Secondary | ICD-10-CM | POA: Insufficient documentation

## 2020-06-15 DIAGNOSIS — E039 Hypothyroidism, unspecified: Secondary | ICD-10-CM | POA: Diagnosis not present

## 2020-06-15 DIAGNOSIS — Z171 Estrogen receptor negative status [ER-]: Secondary | ICD-10-CM

## 2020-06-15 DIAGNOSIS — D6481 Anemia due to antineoplastic chemotherapy: Secondary | ICD-10-CM | POA: Diagnosis not present

## 2020-06-15 DIAGNOSIS — C50812 Malignant neoplasm of overlapping sites of left female breast: Secondary | ICD-10-CM | POA: Diagnosis present

## 2020-06-15 LAB — CBC WITH DIFFERENTIAL (CANCER CENTER ONLY)
Abs Immature Granulocytes: 0.04 10*3/uL (ref 0.00–0.07)
Basophils Absolute: 0.1 10*3/uL (ref 0.0–0.1)
Basophils Relative: 1 %
Eosinophils Absolute: 0.2 10*3/uL (ref 0.0–0.5)
Eosinophils Relative: 3 %
HCT: 35 % — ABNORMAL LOW (ref 36.0–46.0)
Hemoglobin: 11.7 g/dL — ABNORMAL LOW (ref 12.0–15.0)
Immature Granulocytes: 1 %
Lymphocytes Relative: 28 %
Lymphs Abs: 1.5 10*3/uL (ref 0.7–4.0)
MCH: 30.8 pg (ref 26.0–34.0)
MCHC: 33.4 g/dL (ref 30.0–36.0)
MCV: 92.1 fL (ref 80.0–100.0)
Monocytes Absolute: 0.4 10*3/uL (ref 0.1–1.0)
Monocytes Relative: 7 %
Neutro Abs: 3.4 10*3/uL (ref 1.7–7.7)
Neutrophils Relative %: 60 %
Platelet Count: 251 10*3/uL (ref 150–400)
RBC: 3.8 MIL/uL — ABNORMAL LOW (ref 3.87–5.11)
RDW: 13.4 % (ref 11.5–15.5)
WBC Count: 5.5 10*3/uL (ref 4.0–10.5)
nRBC: 0 % (ref 0.0–0.2)

## 2020-06-15 LAB — CMP (CANCER CENTER ONLY)
ALT: 17 U/L (ref 0–44)
AST: 25 U/L (ref 15–41)
Albumin: 4.1 g/dL (ref 3.5–5.0)
Alkaline Phosphatase: 88 U/L (ref 38–126)
Anion gap: 10 (ref 5–15)
BUN: 18 mg/dL (ref 6–20)
CO2: 25 mmol/L (ref 22–32)
Calcium: 9.5 mg/dL (ref 8.9–10.3)
Chloride: 103 mmol/L (ref 98–111)
Creatinine: 1.01 mg/dL — ABNORMAL HIGH (ref 0.44–1.00)
GFR, Estimated: >60 mL/min (ref >60)
Glucose, Bld: 98 mg/dL (ref 70–99)
Potassium: 3.9 mmol/L (ref 3.5–5.1)
Sodium: 138 mmol/L (ref 135–145)
Total Bilirubin: 0.4 mg/dL (ref 0.3–1.2)
Total Protein: 7.8 g/dL (ref 6.5–8.1)

## 2020-06-15 LAB — TSH: TSH: 70.24 u[IU]/mL — ABNORMAL HIGH (ref 0.308–3.960)

## 2020-06-15 MED ORDER — SODIUM CHLORIDE 0.9% FLUSH
10.0000 mL | INTRAVENOUS | Status: DC | PRN
  Filled 2020-06-15: qty 10

## 2020-06-15 MED ORDER — HEPARIN SOD (PORK) LOCK FLUSH 100 UNIT/ML IV SOLN
500.0000 [IU] | Freq: Once | INTRAVENOUS | Status: DC | PRN
  Filled 2020-06-15: qty 5

## 2020-06-15 MED ORDER — SODIUM CHLORIDE 0.9 % IV SOLN
200.0000 mg | Freq: Once | INTRAVENOUS | Status: AC
  Administered 2020-06-15: 200 mg via INTRAVENOUS
  Filled 2020-06-15: qty 8

## 2020-06-15 MED ORDER — SODIUM CHLORIDE 0.9 % IV SOLN
Freq: Once | INTRAVENOUS | Status: AC
  Filled 2020-06-15: qty 250

## 2020-06-15 NOTE — Patient Instructions (Signed)
[facility] Discharge Instructions for Patients Receiving Chemotherapy  Today you received the following chemotherapy agents: Pembrolizumab (Keytruda)  To help prevent nausea and vomiting after your treatment, we encourage you to take your nausea medication as directed.    If you develop nausea and vomiting that is not controlled by your nausea medication, call the clinic.   BELOW ARE SYMPTOMS THAT SHOULD BE REPORTED IMMEDIATELY:  *FEVER GREATER THAN 100.5 F  *CHILLS WITH OR WITHOUT FEVER  NAUSEA AND VOMITING THAT IS NOT CONTROLLED WITH YOUR NAUSEA MEDICATION  *UNUSUAL SHORTNESS OF BREATH  *UNUSUAL BRUISING OR BLEEDING  TENDERNESS IN MOUTH AND THROAT WITH OR WITHOUT PRESENCE OF ULCERS  *URINARY PROBLEMS  *BOWEL PROBLEMS  UNUSUAL RASH Items with * indicate a potential emergency and should be followed up as soon as possible.  Feel free to call the clinic should you have any questions or concerns. The clinic phone number is [phone].  Please show the CHEMO ALERT CARD at check-in to the Emergency Department and triage nurse. 

## 2020-06-16 LAB — T4: T4, Total: 0.9 ug/dL — ABNORMAL LOW (ref 4.5–12.0)

## 2020-07-04 NOTE — Assessment & Plan Note (Signed)
04/25/2019:Patient palpated a left breast and axillary mass x69month. [place] the left breast showed a 3.6cm mass at the 1 o'clock position with 6 smaller adjacent masses extending toward the nipple ranging in size from 1.1cm to 4.0cm. [place] the left axilla showed five bulky lymph nodes, the largest measuring 4.2cm, and second largest measuring 2.8cm. 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 T1c N2 stage IIIc Based on PET CT scan showing bilateral cervical nodes, it is stage IV(however her goal is to cure given the oligometastatic nature of her cancer.)  01/10/2020:Bilateral mastectomies with reconstruction ([facility]): Right breast: no evidence of malignancy Left breast: no evidence of residual carcinoma and 4 benign lymph nodes.  Treatment plan: 1.Adjuvant radiationcompleted 04/18/2020 2.adjuvantPembrolizumab maintenance therapy( be completed 07/26/2020)  Immune mediated adverse effects: Hypothyroidism,onSynthroid. Liver function tests are normal. Chemo induced anemia: Labs are not out today. Patient plans to switch out her birth control pills and have copper IUD placed.  Return to clinic every 3 weeks for pembrolizumab.

## 2020-07-04 NOTE — Progress Notes (Signed)
Patient Care Team: Patient, No Pcp Per as PCP - General (General Practice) [name], RN as Oncology Nurse Navigator [name], RN as Oncology Nurse Navigator [name], MD as Consulting Physician (General Surgery) [name], MD as Consulting Physician (Hematology and Oncology) [name], MD as Consulting Physician (Radiation Oncology)  DIAGNOSIS:    ICD-10-CM   1. 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 [place] the left breast showed a 3.6cm mass at the 1 o'clock position with 6 smaller adjacent masses extending toward the nipple ranging in size from 1.1cm to 4.0cm. [place] the left axilla showed five bulky lymph nodes, the largest measuring 4.2cm, and second largest measuring 2.8cm. 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    04/27/2019 Cancer Staging   Staging form: Breast, AJCC 8th Edition - Clinical: Stage IIIC (cT1c, cN2a, cM0, G3, ER-, PR-, HER2-) - Signed by GNicholas Lose MD on 04/27/2019   05/12/2019 - 10/27/2019 Chemotherapy   The patient had DOXOrubicin (ADRIAMYCIN) chemo injection 128 mg, 60 mg/m2 = 128 mg, Intravenous,  Once, 4 of 4 cycles Administration: 128 mg (05/12/2019), 128 mg (06/03/2019), 128 mg (06/23/2019), 128 mg (07/14/2019) palonosetron (ALOXI) injection 0.25 mg, 0.25 mg, Intravenous,  Once, 8 of 8 cycles Administration: 0.25 mg (05/12/2019), 0.25 mg (08/04/2019), 0.25 mg (06/03/2019), 0.25 mg (09/01/2019), 0.25 mg (06/23/2019), 0.25 mg (07/14/2019), 0.25 mg (09/22/2019), 0.25 mg (10/13/2019) pegfilgrastim-cbqv (UDENYCA) injection 6 mg, 6 mg, Subcutaneous, Once, 4 of 4 cycles Administration: 6 mg  (05/14/2019), 6 mg (06/06/2019), 6 mg (06/25/2019), 6 mg (07/16/2019) CARBOplatin (PARAPLATIN) 700 mg in sodium chloride 0.9 % 250 mL chemo infusion, 700 mg (100 % of original dose 700 mg), Intravenous,  Once, 4 of 4 cycles Dose modification: 700 mg (original dose 700 mg, Cycle 5) Administration: 700 mg (08/04/2019), 700 mg (09/01/2019), 700 mg (09/22/2019), 700 mg (10/13/2019) cyclophosphamide (CYTOXAN) 1,280 mg in sodium chloride 0.9 % 250 mL chemo infusion, 600 mg/m2 = 1,280 mg, Intravenous,  Once, 4 of 4 cycles Administration: 1,280 mg (05/12/2019), 1,280 mg (06/03/2019), 1,280 mg (06/23/2019), 1,280 mg (07/14/2019) PACLitaxel (TAXOL) 174 mg in sodium chloride 0.9 % 250 mL chemo infusion (</= 842mm2), 80 mg/m2 = 174 mg, Intravenous,  Once, 4 of 4 cycles Dose modification: 65 mg/m2 (original dose 80 mg/m2, Cycle 6, Reason: Dose not tolerated) Administration: 174 mg (08/04/2019), 174 mg (08/11/2019), 138 mg (09/01/2019), 138 mg (08/25/2019), 138 mg (09/08/2019), 138 mg (09/15/2019), 138 mg (09/22/2019), 138 mg (09/29/2019), 138 mg (10/07/2019), 138 mg (10/13/2019), 138 mg (10/21/2019), 138 mg (10/27/2019) fosaprepitant (EMEND) 150 mg in sodium chloride 0.9 % 145 mL IVPB, 150 mg, Intravenous,  Once, 8 of 8 cycles Administration: 150 mg (05/12/2019), 150 mg (08/04/2019), 150 mg (06/03/2019), 150 mg (09/01/2019), 150 mg (06/23/2019), 150 mg (07/14/2019), 150 mg (09/22/2019), 150 mg (10/13/2019) pembrolizumab (KEYTRUDA) 200 mg in sodium chloride 0.9 % 50 mL chemo infusion, 2 mg/kg = 200 mg, Intravenous, Once, 8 of 8 cycles Administration: 200 mg (05/12/2019), 200 mg (06/03/2019), 200 mg (06/23/2019), 200 mg (07/14/2019), 200 mg (08/04/2019), 200 mg (09/01/2019), 200 mg (09/22/2019), 200 mg (10/13/2019)  for chemotherapy treatment.  Genetic Testing   Negative genetic testing. No pathogenic variants identified. VUS in HOXB13 called c.473C>A, VUS in POLD1 called c.1610G>C, and VUS in RAD50 called c.1094G>A identified on the [facility] Common Hereditary  Cancers Panel. The report date is 05/10/2019.  The Common Hereditary Cancers Panel offered by [facility] includes sequencing and/or deletion duplication testing of the following 48 genes: APC, ATM, AXIN2, BARD1, BMPR1A, BRCA1, BRCA2, BRIP1, CDH1, CDKN2A (p14ARF), CDKN2A (p16INK4a), CKD4, CHEK2, CTNNA1, DICER1, EPCAM (Deletion/duplication testing only), GREM1 (promoter region deletion/duplication testing only), KIT, MEN1, MLH1, MSH2, MSH3, MSH6, MUTYH, NBN, NF1, NHTL1, PALB2, PDGFRA, PMS2, POLD1, POLE, PTEN, RAD50, RAD51C, RAD51D, RNF43, SDHB, SDHC, SDHD, SMAD4, SMARCA4. STK11, TP53, TSC1, TSC2, and VHL.  The following genes were evaluated for sequence changes only: SDHA and HOXB13 c.251G>A variant only.   01/10/2020 Surgery   Bilateral mastectomies with reconstruction ([facility]): Right breast: no evidence tof malignancy Left breast: no evidence of residual carcinoma and 4 benign lymph nodes.    02/10/2020 -  Chemotherapy    Patient is on Treatment Plan: BREAST PEMBROLIZUMAB Q21D      02/28/2020 -  Radiation Therapy   Adjuvant radiation     CHIEF COMPLIANT: Follow-up on immunotherapy with pembrolizumab  INTERVAL HISTORY: Heather Mckenzie is a 40 y.o. with above-mentioned history of triple negative breast cancer who underwent neoadjuvant chemotherapy and had a complete pathologic response and is currently on maintenance therapy with pembrolizumab.She presents to the clinic todayfor treatment. She is tolerating pembrolizumab extremely well without any problems or concerns.  ALLERGIES:  has No Known Allergies.  MEDICATIONS:  Current Outpatient Medications  Medication Sig Dispense Refill  . levonorgestrel-ethinyl estradiol (LYBREL) 90-20 MCG tablet Take 1 tablet by mouth daily. (28) 90 mcg-20 mcg tablet    . levothyroxine (SYNTHROID) 88 MCG tablet Take 1 tablet (88 mcg total) by mouth daily before breakfast. 30 tablet 3   No current facility-administered medications for this visit.    Facility-Administered Medications Ordered in Other Visits  Medication Dose Route Frequency Provider Last Rate Last Admin  . sodium chloride flush (NS) 0.9 % injection 10 mL  10 mL Intracatheter PRN [name], MD   10 mL at 07/05/20 0939    PHYSICAL EXAMINATION: ECOG PERFORMANCE STATUS: 1 - Symptomatic but completely ambulatory  There were no vitals filed for this visit. There were no vitals filed for this visit.  LABORATORY DATA:  I have reviewed the data as listed CMP Latest Ref Rng & Units 06/15/2020 05/25/2020 05/04/2020  Glucose 70 - 99 mg/dL 98 94 89  BUN 6 - 20 mg/dL 18 21(H) 14  Creatinine 0.44 - 1.00 mg/dL 1.01(H) 0.95 0.96  Sodium 135 - 145 mmol/L 138 138 140  Potassium 3.5 - 5.1 mmol/L 3.9 3.9 4.1  Chloride 98 - 111 mmol/L 103 104 103  CO2 22 - 32 mmol/L _0 Calcium 8.9 - 10.3 mg/dL 9.5 9.0 9.4  Total Protein 6.5 - 8.1 g/dL 7.8 7.5 7.6  Total Bilirubin 0.3 - 1.2 mg/dL 0.4 0.4 0.4  Alkaline Phos 38 - 126 U/L 88 80 81  AST 15 - 41 U/L _1 ALT 0 - 44 U/L _2 Lab Results  Component Value Date   WBC 5.5 06/15/2020   HGB 11.7 (L) 06/15/2020   HCT 35.0 (L) 06/15/2020   MCV 92.1 06/15/2020   PLT 251 06/15/2020   NEUTROABS 3.4 06/15/2020    ASSESSMENT & PLAN:  Malignant neoplasm of overlapping sites  of left breast in female, estrogen receptor negative ([facility]) 04/25/2019:Patient palpated a left breast and axillary mass x3months. US of the left breast showed a 3.6cm mass at the 1 o'clock position with 6 smaller adjacent masses extending toward the nipple ranging in size from 1.1cm to 4.0cm. US of the left axilla showed five bulky lymph nodes, the largest measuring 4.2cm, and second largest measuring 2.8cm. 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 T1c N2 stage IIIc Based on PET CT scan showing bilateral cervical nodes, it is stage IV(however her goal is to cure given the oligometastatic nature of her  cancer.)  01/10/2020:Bilateral mastectomies with reconstruction ([facility]): Right breast: no evidence of malignancy Left breast: no evidence of residual carcinoma and 4 benign lymph nodes.  Treatment plan: 1.Adjuvant radiationcompleted 04/18/2020 2.adjuvantPembrolizumab maintenance therapy( be completed 07/26/2020)  Immune mediated adverse effects: Hypothyroidism,onSynthroid. I discussed with her to keep appointments with endocrinology since we will not be checking her TSH levels after the next treatment.  It is likely that the hypothyroidism is permanent.  So she will remain on thyroid replacement therapy.  She is awaiting a phone call from surgery office to get the port removed after her last treatment.  Return to clinic in 3 weeks for her last treatment of pembrolizumab.    No orders of the defined types were placed in this encounter.  The patient has a good understanding of the overall plan. she agrees with it. she will call with any problems that may develop before the next visit here.  Total time spent: 30 mins including face to face time and time spent for planning, charting and coordination of care  Vinay K Gudena, MD, MPH 07/05/2020  I, Molly Dorshimer, am acting as scribe for Dr. Vinay Gudena.  I have reviewed the above documentation for accuracy and completeness, and I agree with the above.       

## 2020-07-05 ENCOUNTER — Inpatient Hospital Stay: Payer: 59

## 2020-07-05 ENCOUNTER — Inpatient Hospital Stay (HOSPITAL_BASED_OUTPATIENT_CLINIC_OR_DEPARTMENT_OTHER): Payer: 59 | Admitting: Hematology and Oncology

## 2020-07-05 ENCOUNTER — Other Ambulatory Visit: Payer: Self-pay

## 2020-07-05 DIAGNOSIS — C50812 Malignant neoplasm of overlapping sites of left female breast: Secondary | ICD-10-CM | POA: Diagnosis not present

## 2020-07-05 DIAGNOSIS — Z5112 Encounter for antineoplastic immunotherapy: Secondary | ICD-10-CM | POA: Diagnosis not present

## 2020-07-05 DIAGNOSIS — Z171 Estrogen receptor negative status [ER-]: Secondary | ICD-10-CM

## 2020-07-05 DIAGNOSIS — Z95828 Presence of other vascular implants and grafts: Secondary | ICD-10-CM

## 2020-07-05 LAB — CMP (CANCER CENTER ONLY)
ALT: 25 U/L (ref 0–44)
AST: 39 U/L (ref 15–41)
Albumin: 3.9 g/dL (ref 3.5–5.0)
Alkaline Phosphatase: 93 U/L (ref 38–126)
Anion gap: 11 (ref 5–15)
BUN: 19 mg/dL (ref 6–20)
CO2: 27 mmol/L (ref 22–32)
Calcium: 9.2 mg/dL (ref 8.9–10.3)
Chloride: 102 mmol/L (ref 98–111)
Creatinine: 1.15 mg/dL — ABNORMAL HIGH (ref 0.44–1.00)
GFR, Estimated: >60 mL/min (ref >60)
Glucose, Bld: 94 mg/dL (ref 70–99)
Potassium: 4.1 mmol/L (ref 3.5–5.1)
Sodium: 140 mmol/L (ref 135–145)
Total Bilirubin: 0.4 mg/dL (ref 0.3–1.2)
Total Protein: 7.6 g/dL (ref 6.5–8.1)

## 2020-07-05 LAB — CBC WITH DIFFERENTIAL (CANCER CENTER ONLY)
Abs Immature Granulocytes: 0.03 10*3/uL (ref 0.00–0.07)
Basophils Absolute: 0.1 10*3/uL (ref 0.0–0.1)
Basophils Relative: 1 %
Eosinophils Absolute: 0.3 10*3/uL (ref 0.0–0.5)
Eosinophils Relative: 4 %
HCT: 33.4 % — ABNORMAL LOW (ref 36.0–46.0)
Hemoglobin: 11.1 g/dL — ABNORMAL LOW (ref 12.0–15.0)
Immature Granulocytes: 1 %
Lymphocytes Relative: 27 %
Lymphs Abs: 1.8 10*3/uL (ref 0.7–4.0)
MCH: 30.7 pg (ref 26.0–34.0)
MCHC: 33.2 g/dL (ref 30.0–36.0)
MCV: 92.3 fL (ref 80.0–100.0)
Monocytes Absolute: 0.5 10*3/uL (ref 0.1–1.0)
Monocytes Relative: 8 %
Neutro Abs: 4 10*3/uL (ref 1.7–7.7)
Neutrophils Relative %: 59 %
Platelet Count: 237 10*3/uL (ref 150–400)
RBC: 3.62 MIL/uL — ABNORMAL LOW (ref 3.87–5.11)
RDW: 13.2 % (ref 11.5–15.5)
WBC Count: 6.7 10*3/uL (ref 4.0–10.5)
nRBC: 0 % (ref 0.0–0.2)

## 2020-07-05 LAB — TSH: TSH: 82.807 u[IU]/mL — ABNORMAL HIGH (ref 0.308–3.960)

## 2020-07-05 MED ORDER — PEMBROLIZUMAB CHEMO INJECTION 100 MG/4ML
200.0000 mg | Freq: Once | INTRAVENOUS | Status: AC
  Administered 2020-07-05: 200 mg via INTRAVENOUS
  Filled 2020-07-05: qty 8

## 2020-07-05 MED ORDER — SODIUM CHLORIDE 0.9% FLUSH
10.0000 mL | INTRAVENOUS | Status: DC | PRN
  Administered 2020-07-05: 10 mL
  Filled 2020-07-05: qty 10

## 2020-07-05 MED ORDER — SODIUM CHLORIDE 0.9 % IV SOLN
Freq: Once | INTRAVENOUS | Status: AC
  Filled 2020-07-05: qty 250

## 2020-07-05 MED ORDER — HEPARIN SOD (PORK) LOCK FLUSH 100 UNIT/ML IV SOLN
500.0000 [IU] | Freq: Once | INTRAVENOUS | Status: AC | PRN
  Administered 2020-07-05: 500 [IU]
  Filled 2020-07-05: qty 5

## 2020-07-05 NOTE — Patient Instructions (Signed)
[facility] Discharge Instructions for Patients Receiving Chemotherapy  Today you received the following chemotherapy agents: Pembrolizumab (Keytruda)  To help prevent nausea and vomiting after your treatment, we encourage you to take your nausea medication as directed.    If you develop nausea and vomiting that is not controlled by your nausea medication, call the clinic.   BELOW ARE SYMPTOMS THAT SHOULD BE REPORTED IMMEDIATELY:  *FEVER GREATER THAN 100.5 F  *CHILLS WITH OR WITHOUT FEVER  NAUSEA AND VOMITING THAT IS NOT CONTROLLED WITH YOUR NAUSEA MEDICATION  *UNUSUAL SHORTNESS OF BREATH  *UNUSUAL BRUISING OR BLEEDING  TENDERNESS IN MOUTH AND THROAT WITH OR WITHOUT PRESENCE OF ULCERS  *URINARY PROBLEMS  *BOWEL PROBLEMS  UNUSUAL RASH Items with * indicate a potential emergency and should be followed up as soon as possible.  Feel free to call the clinic should you have any questions or concerns. The clinic phone number is [phone].  Please show the CHEMO ALERT CARD at check-in to the Emergency Department and triage nurse. 

## 2020-07-05 NOTE — Patient Instructions (Signed)
Implanted Port Insertion, Care After This sheet gives you information about how to care for yourself after your procedure. Your health care provider may also give you more specific instructions. If you have problems or questions, contact your health care provider. What can I expect after the procedure? After the procedure, it is common to have:  Discomfort at the port insertion site.  Bruising on the skin over the port. This should improve over 3-4 days. Follow these instructions at home: Port care  After your port is placed, you will get a manufacturer's information card. The card has information about your port. Keep this card with you at all times.  Take care of the port as told by your health care provider. Ask your health care provider if you or a family member can get training for taking care of the port at home. A home health care nurse may also take care of the port.  Make sure to remember what type of port you have. Incision care  Follow instructions from your health care provider about how to take care of your port insertion site. Make sure you: ? Wash your hands with soap and water before and after you change your bandage (dressing). If soap and water are not available, use hand sanitizer. ? Change your dressing as told by your health care provider. ? Leave stitches (sutures), skin glue, or adhesive strips in place. These skin closures may need to stay in place for 2 weeks or longer. If adhesive strip edges start to loosen and curl up, you may trim the loose edges. Do not remove adhesive strips completely unless your health care provider tells you to do that.  Check your port insertion site every day for signs of infection. Check for: ? Redness, swelling, or pain. ? Fluid or blood. ? Warmth. ? Pus or a bad smell.      Activity  Return to your normal activities as told by your health care provider. Ask your health care provider what activities are safe for you.  Do not  lift anything that is heavier than 10 lb (4.5 kg), or the limit that you are told, until your health care provider says that it is safe. General instructions  Take over-the-counter and prescription medicines only as told by your health care provider.  Do not take baths, swim, or use a hot tub until your health care provider approves. Ask your health care provider if you may take showers. You may only be allowed to take sponge baths.  Do not drive for 24 hours if you were given a sedative during your procedure.  Wear a medical alert bracelet in case of an emergency. This will tell any health care providers that you have a port.  Keep all follow-up visits as told by your health care provider. This is important. Contact a health care provider if:  You cannot flush your port with saline as directed, or you cannot draw blood from the port.  You have a fever or chills.  You have redness, swelling, or pain around your port insertion site.  You have fluid or blood coming from your port insertion site.  Your port insertion site feels warm to the touch.  You have pus or a bad smell coming from the port insertion site. Get help right away if:  You have chest pain or shortness of breath.  You have bleeding from your port that you cannot control. Summary  Take care of the port as told by your   health care provider. Keep the manufacturer's information card with you at all times.  Change your dressing as told by your health care provider.  Contact a health care provider if you have a fever or chills or if you have redness, swelling, or pain around your port insertion site.  Keep all follow-up visits as told by your health care provider. This information is not intended to replace advice given to you by your health care provider. Make sure you discuss any questions you have with your health care provider. Document Revised: 10/27/2017 Document Reviewed: 10/27/2017 Elsevier Patient Education   2021 Elsevier Inc.  

## 2020-07-06 LAB — T4: T4, Total: 2.3 ug/dL — ABNORMAL LOW (ref 4.5–12.0)

## 2020-07-23 ENCOUNTER — Other Ambulatory Visit: Payer: Self-pay

## 2020-07-23 ENCOUNTER — Ambulatory Visit: Payer: 59 | Attending: Surgery

## 2020-07-23 DIAGNOSIS — Z483 Aftercare following surgery for neoplasm: Secondary | ICD-10-CM | POA: Insufficient documentation
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
## 2020-07-25 NOTE — Assessment & Plan Note (Signed)
04/25/2019:Patient palpated a left breast and axillary mass x22month. [place] the left breast showed a 3.6cm mass at the 1 o'clock position with 6 smaller adjacent masses extending toward the nipple ranging in size from 1.1cm to 4.0cm. [place] the left axilla showed five bulky lymph nodes, the largest measuring 4.2cm, and second largest measuring 2.8cm. 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 T1c N2 stage IIIc Based on PET CT scan showing bilateral cervical nodes, it is stage IV(however her goal is to cure given the oligometastatic nature of her cancer.)  01/10/2020:Bilateral mastectomies with reconstruction (Cornett & Pace): Right breast: no evidence of malignancy Left breast: no evidence of residual carcinoma and 4 benign lymph nodes.  Treatment plan: 1.Adjuvant radiationcompleted 04/18/2020 2.adjuvantPembrolizumab maintenance therapy(be completed 07/26/2020): Today is the last treatment.  Immune mediated adverse effects: Hypothyroidism,on Synthroid, under the care of endocrinology..  She is awaiting a phone call from surgery office to get the port removed after her last treatment.  Breast cancer surveillance: No role of imaging since she had bilateral mastectomies Return to clinic in 4 months for survivorship care plan visit

## 2020-07-25 NOTE — Progress Notes (Signed)
 Patient Care Team: Patient, No Pcp Per (Inactive) as PCP - General (General Practice) Martini, Keisha N, RN as Oncology Nurse Navigator Stuart, Dawn C, RN as Oncology Nurse Navigator Cornett, Thomas, MD as Consulting Physician (General Surgery) Gudena, Vinay, MD as Consulting Physician (Hematology and Oncology) Kinard, James, MD as Consulting Physician (Radiation Oncology)  DIAGNOSIS:    ICD-10-CM   1. Malignant neoplasm of overlapping sites of left breast in female, estrogen receptor negative (HCC)  C50.812 CT CHEST ABDOMEN PELVIS W CONTRAST   Z17.1     SUMMARY OF ONCOLOGIC HISTORY: Oncology History  Malignant neoplasm of overlapping sites of left breast in female, estrogen receptor negative (HCC)  04/25/2019 Initial Diagnosis   Patient palpated a left breast and axillary mass x3months. US of the left breast showed a 3.6cm mass at the 1 o'clock position with 6 smaller adjacent masses extending toward the nipple ranging in size from 1.1cm to 4.0cm. US of the left axilla showed five bulky lymph nodes, the largest measuring 4.2cm, and second largest measuring 2.8cm. 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    04/27/2019 Cancer Staging   Staging form: Breast, AJCC 8th Edition - Clinical: Stage IIIC (cT1c, cN2a, cM0, G3, ER-, PR-, HER2-) - Signed by Gudena, Vinay, MD on 04/27/2019   05/12/2019 - 10/27/2019 Chemotherapy   The patient had DOXOrubicin (ADRIAMYCIN) chemo injection 128 mg, 60 mg/m2 = 128 mg, Intravenous,  Once, 4 of 4 cycles Administration: 128 mg (05/12/2019), 128 mg (06/03/2019), 128 mg (06/23/2019), 128 mg (07/14/2019) palonosetron (ALOXI) injection 0.25 mg, 0.25 mg, Intravenous,  Once, 8 of 8 cycles Administration: 0.25 mg (05/12/2019), 0.25 mg (08/04/2019), 0.25 mg (06/03/2019), 0.25 mg (09/01/2019), 0.25 mg (06/23/2019), 0.25 mg (07/14/2019), 0.25 mg (09/22/2019), 0.25 mg (10/13/2019) pegfilgrastim-cbqv (UDENYCA) injection 6 mg, 6 mg,  Subcutaneous, Once, 4 of 4 cycles Administration: 6 mg (05/14/2019), 6 mg (06/06/2019), 6 mg (06/25/2019), 6 mg (07/16/2019) CARBOplatin (PARAPLATIN) 700 mg in sodium chloride 0.9 % 250 mL chemo infusion, 700 mg (100 % of original dose 700 mg), Intravenous,  Once, 4 of 4 cycles Dose modification: 700 mg (original dose 700 mg, Cycle 5) Administration: 700 mg (08/04/2019), 700 mg (09/01/2019), 700 mg (09/22/2019), 700 mg (10/13/2019) cyclophosphamide (CYTOXAN) 1,280 mg in sodium chloride 0.9 % 250 mL chemo infusion, 600 mg/m2 = 1,280 mg, Intravenous,  Once, 4 of 4 cycles Administration: 1,280 mg (05/12/2019), 1,280 mg (06/03/2019), 1,280 mg (06/23/2019), 1,280 mg (07/14/2019) PACLitaxel (TAXOL) 174 mg in sodium chloride 0.9 % 250 mL chemo infusion (</= 80mg/m2), 80 mg/m2 = 174 mg, Intravenous,  Once, 4 of 4 cycles Dose modification: 65 mg/m2 (original dose 80 mg/m2, Cycle 6, Reason: Dose not tolerated) Administration: 174 mg (08/04/2019), 174 mg (08/11/2019), 138 mg (09/01/2019), 138 mg (08/25/2019), 138 mg (09/08/2019), 138 mg (09/15/2019), 138 mg (09/22/2019), 138 mg (09/29/2019), 138 mg (10/07/2019), 138 mg (10/13/2019), 138 mg (10/21/2019), 138 mg (10/27/2019) fosaprepitant (EMEND) 150 mg in sodium chloride 0.9 % 145 mL IVPB, 150 mg, Intravenous,  Once, 8 of 8 cycles Administration: 150 mg (05/12/2019), 150 mg (08/04/2019), 150 mg (06/03/2019), 150 mg (09/01/2019), 150 mg (06/23/2019), 150 mg (07/14/2019), 150 mg (09/22/2019), 150 mg (10/13/2019) pembrolizumab (KEYTRUDA) 200 mg in sodium chloride 0.9 % 50 mL chemo infusion, 2 mg/kg = 200 mg, Intravenous, Once, 8 of 8 cycles Administration: 200 mg (05/12/2019), 200 mg (06/03/2019), 200 mg (06/23/2019), 200 mg (07/14/2019), 200 mg (08/04/2019), 200 mg (09/01/2019), 200 mg (09/22/2019), 200 mg (10/13/2019)    for chemotherapy treatment.     Genetic Testing   Negative genetic testing. No pathogenic variants identified. VUS in HOXB13 called c.473C>A, VUS in POLD1 called c.1610G>C, and VUS in RAD50  called c.1094G>A identified on the [facility] Common Hereditary Cancers Panel. The report date is 05/10/2019.  The Common Hereditary Cancers Panel offered by [facility] includes sequencing and/or deletion duplication testing of the following 48 genes: APC, ATM, AXIN2, BARD1, BMPR1A, BRCA1, BRCA2, BRIP1, CDH1, CDKN2A (p14ARF), CDKN2A (p16INK4a), CKD4, CHEK2, CTNNA1, DICER1, EPCAM (Deletion/duplication testing only), GREM1 (promoter region deletion/duplication testing only), KIT, MEN1, MLH1, MSH2, MSH3, MSH6, MUTYH, NBN, NF1, NHTL1, PALB2, PDGFRA, PMS2, POLD1, POLE, PTEN, RAD50, RAD51C, RAD51D, RNF43, SDHB, SDHC, SDHD, SMAD4, SMARCA4. STK11, TP53, TSC1, TSC2, and VHL.  The following genes were evaluated for sequence changes only: SDHA and HOXB13 c.251G>A variant only.   01/10/2020 Surgery   Bilateral mastectomies with reconstruction (Cornett & Pace): Right breast: no evidence tof malignancy Left breast: no evidence of residual carcinoma and 4 benign lymph nodes.    02/10/2020 -  Chemotherapy    Patient is on Treatment Plan: BREAST PEMBROLIZUMAB Q21D      02/28/2020 -  Radiation Therapy   Adjuvant radiation     CHIEF COMPLIANT: Follow-up on immunotherapy with pembrolizumab  INTERVAL HISTORY: Heather Mckenzie is a 40 y.o. with above-mentioned history of triple negative breast cancer who underwent neoadjuvant chemotherapy and had a complete pathologic response and is currently on maintenance therapy with pembrolizumab.She presents to the clinic todayfor treatment. She has tolerated pembrolizumab extremely well.  Today is her last treatment with Pembro.  ALLERGIES:  has No Known Allergies.  MEDICATIONS:  Current Outpatient Medications  Medication Sig Dispense Refill  . levonorgestrel-ethinyl estradiol (LYBREL) 90-20 MCG tablet Take 1 tablet by mouth daily. (28) 90 mcg-20 mcg tablet    . levothyroxine (SYNTHROID) 88 MCG tablet Take 1 tablet (88 mcg total) by mouth daily before breakfast. 30 tablet 3    No current facility-administered medications for this visit.   Facility-Administered Medications Ordered in Other Visits  Medication Dose Route Frequency Provider Last Rate Last Admin  . sodium chloride flush (NS) 0.9 % injection 10 mL  10 mL Intracatheter PRN Gudena, Vinay, MD   10 mL at 07/26/20 1112    PHYSICAL EXAMINATION: ECOG PERFORMANCE STATUS: 1 - Symptomatic but completely ambulatory  There were no vitals filed for this visit. Filed Weights   07/26/20 1124  Weight: 216 lb 12.8 oz (98.3 kg)     LABORATORY DATA:  I have reviewed the data as listed CMP Latest Ref Rng & Units 07/05/2020 06/15/2020 05/25/2020  Glucose 70 - 99 mg/dL 94 98 94  BUN 6 - 20 mg/dL 19 18 21(H)  Creatinine 0.44 - 1.00 mg/dL 1.15(H) 1.01(H) 0.95  Sodium 135 - 145 mmol/L 140 138 138  Potassium 3.5 - 5.1 mmol/L 4.1 3.9 3.9  Chloride 98 - 111 mmol/L 102 103 104  CO2 22 - 32 mmol/L 27 25 25  Calcium 8.9 - 10.3 mg/dL 9.2 9.5 9.0  Total Protein 6.5 - 8.1 g/dL 7.6 7.8 7.5  Total Bilirubin 0.3 - 1.2 mg/dL 0.4 0.4 0.4  Alkaline Phos 38 - 126 U/L 93 88 80  AST 15 - 41 U/L 39 25 22  ALT 0 - 44 U/L 25 17 14    Lab Results  Component Value Date   WBC 6.2 07/26/2020   HGB 11.2 (L) 07/26/2020   HCT 34.1 (L) 07/26/2020   MCV 92.4 07/26/2020   PLT   270 07/26/2020   NEUTROABS 3.9 07/26/2020    ASSESSMENT & PLAN:  Malignant neoplasm of overlapping sites of left breast in female, estrogen receptor negative (HCC) 04/25/2019:Patient palpated a left breast and axillary mass x3months. US of the left breast showed a 3.6cm mass at the 1 o'clock position with 6 smaller adjacent masses extending toward the nipple ranging in size from 1.1cm to 4.0cm. US of the left axilla showed five bulky lymph nodes, the largest measuring 4.2cm, and second largest measuring 2.8cm. 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 T1c N2 stage IIIc Based on PET CT scan showing bilateral cervical  nodes, it is stage IV(however her goal is to cure given the oligometastatic nature of her cancer.)  01/10/2020:Bilateral mastectomies with reconstruction ([facility]ett & Pace): Right breast: no evidence of malignancy Left breast: no evidence of residual carcinoma and 4 benign lymph nodes.  Treatment plan: 1.Adjuvant radiationcompleted 04/18/2020 2.adjuvantPembrolizumab maintenance therapy(be completed 07/26/2020): Today is the last treatment.  Immune mediated adverse effects: Hypothyroidism,on Synthroid, under the care of endocrinology..  She is awaiting a phone call from surgery office to get the port removed after her last treatment.  Breast cancer surveillance: Given the prior history of cervical lymphadenopathy, I would like to obtain CT chest abdomen pelvis with contrast for restaging purposes for metastatic breast cancer triple negative disease.  We like to get this done next couple of weeks and I will call her the day after the scan to discuss the results.  Return to clinic in 4 months for survivorship care plan visit     Orders Placed This Encounter  Procedures  . CT CHEST ABDOMEN PELVIS W CONTRAST    Standing Status:   Future    Standing Expiration Date:   07/26/2021    Order Specific Question:   If indicated for the ordered procedure, I authorize the administration of contrast media per Radiology protocol    Answer:   Yes    Order Specific Question:   Is patient pregnant?    Answer:   No    Order Specific Question:   Preferred imaging location?    Answer:   [facility]    Order Specific Question:   Release to patient    Answer:   Immediate    Order Specific Question:   Is Oral Contrast requested for this exam?    Answer:   Yes, Per Radiology protocol    Order Specific Question:   Reason for Exam (SYMPTOM  OR DIAGNOSIS REQUIRED)    Answer:   Cervical lymphadenopathy triple neg breast cancer   The patient has a good understanding of the overall plan. she  agrees with it. she will call with any problems that may develop before the next visit here.  Total time spent: 30 mins including face to face time and time spent for planning, charting and coordination of care  Vinay K Gudena, MD, MPH 07/26/2020  I, Molly Dorshimer, am acting as scribe for Dr. Vinay Gudena.  I have reviewed the above documentation for accuracy and completeness, and I agree with the above.       

## 2020-07-26 ENCOUNTER — Inpatient Hospital Stay: Payer: 59

## 2020-07-26 ENCOUNTER — Encounter: Payer: Self-pay | Admitting: *Deleted

## 2020-07-26 ENCOUNTER — Inpatient Hospital Stay: Payer: 59 | Attending: Hematology and Oncology

## 2020-07-26 ENCOUNTER — Inpatient Hospital Stay (HOSPITAL_BASED_OUTPATIENT_CLINIC_OR_DEPARTMENT_OTHER): Payer: 59 | Admitting: Hematology and Oncology

## 2020-07-26 ENCOUNTER — Other Ambulatory Visit: Payer: Self-pay

## 2020-07-26 VITALS — BP 118/83 | HR 77 | Temp 98.7°F | Resp 18 | Ht 63.0 in

## 2020-07-26 DIAGNOSIS — Z171 Estrogen receptor negative status [ER-]: Secondary | ICD-10-CM

## 2020-07-26 DIAGNOSIS — Z5112 Encounter for antineoplastic immunotherapy: Secondary | ICD-10-CM | POA: Insufficient documentation

## 2020-07-26 DIAGNOSIS — C50812 Malignant neoplasm of overlapping sites of left female breast: Secondary | ICD-10-CM | POA: Insufficient documentation

## 2020-07-26 DIAGNOSIS — Z79899 Other long term (current) drug therapy: Secondary | ICD-10-CM | POA: Insufficient documentation

## 2020-07-26 DIAGNOSIS — Z95828 Presence of other vascular implants and grafts: Secondary | ICD-10-CM

## 2020-07-26 LAB — CMP (CANCER CENTER ONLY)
ALT: 18 U/L (ref 0–44)
AST: 24 U/L (ref 15–41)
Albumin: 3.9 g/dL (ref 3.5–5.0)
Alkaline Phosphatase: 107 U/L (ref 38–126)
Anion gap: 12 (ref 5–15)
BUN: 15 mg/dL (ref 6–20)
CO2: 26 mmol/L (ref 22–32)
Calcium: 9.5 mg/dL (ref 8.9–10.3)
Chloride: 103 mmol/L (ref 98–111)
Creatinine: 1 mg/dL (ref 0.44–1.00)
GFR, Estimated: >60 mL/min (ref >60)
Glucose, Bld: 95 mg/dL (ref 70–99)
Potassium: 4 mmol/L (ref 3.5–5.1)
Sodium: 141 mmol/L (ref 135–145)
Total Bilirubin: 0.3 mg/dL (ref 0.3–1.2)
Total Protein: 7.5 g/dL (ref 6.5–8.1)

## 2020-07-26 LAB — CBC WITH DIFFERENTIAL (CANCER CENTER ONLY)
Abs Immature Granulocytes: 0.02 10*3/uL (ref 0.00–0.07)
Basophils Absolute: 0 10*3/uL (ref 0.0–0.1)
Basophils Relative: 1 %
Eosinophils Absolute: 0.1 10*3/uL (ref 0.0–0.5)
Eosinophils Relative: 2 %
HCT: 34.1 % — ABNORMAL LOW (ref 36.0–46.0)
Hemoglobin: 11.2 g/dL — ABNORMAL LOW (ref 12.0–15.0)
Immature Granulocytes: 0 %
Lymphocytes Relative: 26 %
Lymphs Abs: 1.6 10*3/uL (ref 0.7–4.0)
MCH: 30.4 pg (ref 26.0–34.0)
MCHC: 32.8 g/dL (ref 30.0–36.0)
MCV: 92.4 fL (ref 80.0–100.0)
Monocytes Absolute: 0.5 10*3/uL (ref 0.1–1.0)
Monocytes Relative: 9 %
Neutro Abs: 3.9 10*3/uL (ref 1.7–7.7)
Neutrophils Relative %: 62 %
Platelet Count: 270 10*3/uL (ref 150–400)
RBC: 3.69 MIL/uL — ABNORMAL LOW (ref 3.87–5.11)
RDW: 13.3 % (ref 11.5–15.5)
WBC Count: 6.2 10*3/uL (ref 4.0–10.5)
nRBC: 0 % (ref 0.0–0.2)

## 2020-07-26 LAB — TSH: TSH: 72.642 u[IU]/mL — ABNORMAL HIGH (ref 0.308–3.960)

## 2020-07-26 MED ORDER — SODIUM CHLORIDE 0.9 % IV SOLN
Freq: Once | INTRAVENOUS | Status: AC
  Filled 2020-07-26: qty 250

## 2020-07-26 MED ORDER — SODIUM CHLORIDE 0.9 % IV SOLN
200.0000 mg | Freq: Once | INTRAVENOUS | Status: AC
  Administered 2020-07-26: 200 mg via INTRAVENOUS
  Filled 2020-07-26: qty 8

## 2020-07-26 MED ORDER — HEPARIN SOD (PORK) LOCK FLUSH 100 UNIT/ML IV SOLN
500.0000 [IU] | Freq: Once | INTRAVENOUS | Status: AC | PRN
  Administered 2020-07-26: 500 [IU]
  Filled 2020-07-26: qty 5

## 2020-07-26 MED ORDER — SODIUM CHLORIDE 0.9% FLUSH
10.0000 mL | INTRAVENOUS | Status: DC | PRN
Start: 2020-07-26 — End: 2020-07-26
  Administered 2020-07-26: 10 mL
  Filled 2020-07-26: qty 10

## 2020-07-26 MED ORDER — SODIUM CHLORIDE 0.9% FLUSH
10.0000 mL | INTRAVENOUS | Status: DC | PRN
  Administered 2020-07-26: 10 mL
  Filled 2020-07-26: qty 10

## 2020-07-26 NOTE — Patient Instructions (Signed)
[facility] Discharge Instructions for Patients Receiving Chemotherapy  Today you received the following chemotherapy agents: Pembrolizumab (Keytruda)  To help prevent nausea and vomiting after your treatment, we encourage you to take your nausea medication as directed.    If you develop nausea and vomiting that is not controlled by your nausea medication, call the clinic.   BELOW ARE SYMPTOMS THAT SHOULD BE REPORTED IMMEDIATELY:  *FEVER GREATER THAN 100.5 F  *CHILLS WITH OR WITHOUT FEVER  NAUSEA AND VOMITING THAT IS NOT CONTROLLED WITH YOUR NAUSEA MEDICATION  *UNUSUAL SHORTNESS OF BREATH  *UNUSUAL BRUISING OR BLEEDING  TENDERNESS IN MOUTH AND THROAT WITH OR WITHOUT PRESENCE OF ULCERS  *URINARY PROBLEMS  *BOWEL PROBLEMS  UNUSUAL RASH Items with * indicate a potential emergency and should be followed up as soon as possible.  Feel free to call the clinic should you have any questions or concerns. The clinic phone number is [phone].  Please show the CHEMO ALERT CARD at check-in to the Emergency Department and triage nurse. 

## 2020-07-27 LAB — T4: T4, Total: 2.5 ug/dL — ABNORMAL LOW (ref 4.5–12.0)

## 2020-08-09 ENCOUNTER — Other Ambulatory Visit: Payer: Self-pay

## 2020-08-09 ENCOUNTER — Ambulatory Visit (HOSPITAL_COMMUNITY)
Admission: RE | Admit: 2020-08-09 | Discharge: 2020-08-09 | Disposition: A | Discharge status: Home / Self Care | Payer: 59 | Source: Ambulatory Visit | Attending: Hematology and Oncology | Admitting: Hematology and Oncology

## 2020-08-09 DIAGNOSIS — C50812 Malignant neoplasm of overlapping sites of left female breast: Secondary | ICD-10-CM | POA: Diagnosis not present

## 2020-08-09 DIAGNOSIS — Z171 Estrogen receptor negative status [ER-]: Secondary | ICD-10-CM | POA: Diagnosis present

## 2020-08-09 MED ORDER — IOHEXOL 300 MG/ML  SOLN
100.0000 mL | Freq: Once | INTRAMUSCULAR | Status: AC | PRN
  Administered 2020-08-09: 100 mL via INTRAVENOUS

## 2020-08-12 NOTE — Assessment & Plan Note (Signed)
04/25/2019:Patient palpated a left breast and axillary mass x66month. [place] the left breast showed a 3.6cm mass at the 1 o'clock position with 6 smaller adjacent masses extending toward the nipple ranging in size from 1.1cm to 4.0cm. [place] the left axilla showed five bulky lymph nodes, the largest measuring 4.2cm, and second largest measuring 2.8cm. 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 T1c N2 stage IIIc Based on PET CT scan showing bilateral cervical nodes, it is stage IV(however her goal is to cure given the oligometastatic nature of her cancer.)  01/10/2020:Bilateral mastectomies with reconstruction ([facility]ett & Pace): Right breast: no evidence of malignancy Left breast: no evidence of residual carcinoma and 4 benign lymph nodes.  Treatment plan: 1.Adjuvant radiationcompleted 04/18/2020 2.adjuvantPembrolizumab maintenance therapy(be completed 07/26/2020): Today is the last treatment.  Immune mediated adverse effects: Hypothyroidism,on Synthroid, under the care of endocrinology..  08/10/20: No evidence of Mets. Liver enhancement likely benign

## 2020-08-12 NOTE — Progress Notes (Signed)
TELEPHONE VISIT:  Patient Care Team: Patient, No Pcp Per (Inactive) as PCP - General (General Practice) [name], RN as Oncology Nurse Navigator [name], RN as Oncology Nurse Navigator [name], MD as Consulting Physician (General Surgery) [name], MD as Consulting Physician (Hematology and Oncology) [name], MD as Consulting Physician (Radiation Oncology)  DIAGNOSIS:    ICD-10-CM   1. Malignant neoplasm of overlapping sites of left breast in female, estrogen receptor negative [facility])  C50.812 MR Abdomen W Wo Contrast   Z17.1     SUMMARY OF ONCOLOGIC HISTORY: Oncology History  Malignant neoplasm of overlapping sites of left breast in female, estrogen receptor negative ([place])  04/25/2019 Initial Diagnosis   Patient palpated a left breast and axillary mass x69month. [place] the left breast showed a 3.6cm mass at the 1 o'clock position with 6 smaller adjacent masses extending toward the nipple ranging in size from 1.1cm to 4.0cm. [place] the left axilla showed five bulky lymph nodes, the largest measuring 4.2cm, and second largest measuring 2.8cm. 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    04/27/2019 Cancer Staging   Staging form: Breast, AJCC 8th Edition - Clinical: Stage IIIC (cT1c, cN2a, cM0, G3, ER-, PR-, HER2-) - Signed by GNicholas Lose MD on 04/27/2019   05/12/2019 - 10/27/2019 Chemotherapy   The patient had DOXOrubicin (ADRIAMYCIN) chemo injection 128 mg, 60 mg/m2 = 128 mg, Intravenous,  Once, 4 of 4 cycles Administration: 128 mg (05/12/2019), 128 mg (06/03/2019), 128 mg (06/23/2019), 128 mg (07/14/2019) palonosetron (ALOXI) injection 0.25 mg, 0.25 mg, Intravenous,  Once, 8 of 8 cycles Administration: 0.25 mg (05/12/2019), 0.25 mg (08/04/2019), 0.25 mg (06/03/2019), 0.25 mg (09/01/2019), 0.25 mg (06/23/2019), 0.25 mg (07/14/2019), 0.25 mg (09/22/2019), 0.25 mg (10/13/2019) pegfilgrastim-cbqv (UDENYCA) injection 6 mg, 6 mg,  Subcutaneous, Once, 4 of 4 cycles Administration: 6 mg (05/14/2019), 6 mg (06/06/2019), 6 mg (06/25/2019), 6 mg (07/16/2019) CARBOplatin (PARAPLATIN) 700 mg in sodium chloride 0.9 % 250 mL chemo infusion, 700 mg (100 % of original dose 700 mg), Intravenous,  Once, 4 of 4 cycles Dose modification: 700 mg (original dose 700 mg, Cycle 5) Administration: 700 mg (08/04/2019), 700 mg (09/01/2019), 700 mg (09/22/2019), 700 mg (10/13/2019) cyclophosphamide (CYTOXAN) 1,280 mg in sodium chloride 0.9 % 250 mL chemo infusion, 600 mg/m2 = 1,280 mg, Intravenous,  Once, 4 of 4 cycles Administration: 1,280 mg (05/12/2019), 1,280 mg (06/03/2019), 1,280 mg (06/23/2019), 1,280 mg (07/14/2019) PACLitaxel (TAXOL) 174 mg in sodium chloride 0.9 % 250 mL chemo infusion (</= 813mm2), 80 mg/m2 = 174 mg, Intravenous,  Once, 4 of 4 cycles Dose modification: 65 mg/m2 (original dose 80 mg/m2, Cycle 6, Reason: Dose not tolerated) Administration: 174 mg (08/04/2019), 174 mg (08/11/2019), 138 mg (09/01/2019), 138 mg (08/25/2019), 138 mg (09/08/2019), 138 mg (09/15/2019), 138 mg (09/22/2019), 138 mg (09/29/2019), 138 mg (10/07/2019), 138 mg (10/13/2019), 138 mg (10/21/2019), 138 mg (10/27/2019) fosaprepitant (EMEND) 150 mg in sodium chloride 0.9 % 145 mL IVPB, 150 mg, Intravenous,  Once, 8 of 8 cycles Administration: 150 mg (05/12/2019), 150 mg (08/04/2019), 150 mg (06/03/2019), 150 mg (09/01/2019), 150 mg (06/23/2019), 150 mg (07/14/2019), 150 mg (09/22/2019), 150 mg (10/13/2019) pembrolizumab (KEYTRUDA) 200 mg in sodium chloride 0.9 % 50 mL chemo infusion, 2 mg/kg = 200 mg, Intravenous, Once, 8 of 8 cycles Administration: 200 mg (05/12/2019), 200 mg (06/03/2019), 200 mg (06/23/2019), 200 mg (07/14/2019), 200 mg (08/04/2019), 200 mg (09/01/2019), 200 mg (09/22/2019), 200  mg (10/13/2019)  for chemotherapy treatment.     Genetic Testing   Negative genetic testing. No pathogenic variants identified. VUS in HOXB13 called c.473C>A, VUS in POLD1 called c.1610G>C, and VUS in RAD50  called c.1094G>A identified on the [facility] Common Hereditary Cancers Panel. The report date is 05/10/2019.  The Common Hereditary Cancers Panel offered by [facility] includes sequencing and/or deletion duplication testing of the following 48 genes: APC, ATM, AXIN2, BARD1, BMPR1A, BRCA1, BRCA2, BRIP1, CDH1, CDKN2A (p14ARF), CDKN2A (p16INK4a), CKD4, CHEK2, CTNNA1, DICER1, EPCAM (Deletion/duplication testing only), GREM1 (promoter region deletion/duplication testing only), KIT, MEN1, MLH1, MSH2, MSH3, MSH6, MUTYH, NBN, NF1, NHTL1, PALB2, PDGFRA, PMS2, POLD1, POLE, PTEN, RAD50, RAD51C, RAD51D, RNF43, SDHB, SDHC, SDHD, SMAD4, SMARCA4. STK11, TP53, TSC1, TSC2, and VHL.  The following genes were evaluated for sequence changes only: SDHA and HOXB13 c.251G>A variant only.   01/10/2020 Surgery   Bilateral mastectomies with reconstruction (Cornett & Pace): Right breast: no evidence tof malignancy Left breast: no evidence of residual carcinoma and 4 benign lymph nodes.    02/10/2020 -  Chemotherapy    Patient is on Treatment Plan: BREAST PEMBROLIZUMAB Q21D      02/28/2020 -  Radiation Therapy   Adjuvant radiation     CHIEF COMPLIANT: Follow-up on immunotherapy with pembrolizumab  INTERVAL HISTORY: Heather Mckenzie is a 40 y.o. 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 She presents to the clinic todayfor treatment.   ALLERGIES:  has No Known Allergies.  MEDICATIONS:  Current Outpatient Medications  Medication Sig Dispense Refill  . levonorgestrel-ethinyl estradiol (LYBREL) 90-20 MCG tablet Take 1 tablet by mouth daily. (28) 90 mcg-20 mcg tablet    . levothyroxine (SYNTHROID) 88 MCG tablet Take 1  tablet (88 mcg total) by mouth daily before breakfast. 30 tablet 3   No current facility-administered medications for this visit.    PHYSICAL EXAMINATION: ECOG PERFORMANCE STATUS: 1 - Symptomatic but completely ambulatory   LABORATORY DATA:  I have reviewed the data as listed CMP Latest Ref Rng & Units 07/26/2020 07/05/2020 06/15/2020  Glucose 70 - 99 mg/dL 95 94 98  BUN 6 - 20 mg/dL '15 19 18  ' Creatinine 0.44 - 1.00 mg/dL 1.00 1.15(H) 1.01(H)  Sodium 135 - 145 mmol/L 141 140 138  Potassium 3.5 - 5.1 mmol/L 4.0 4.1 3.9  Chloride 98 - 111 mmol/L 103 102 103  CO2 22 - 32 mmol/L '26 27 25  ' Calcium 8.9 - 10.3 mg/dL 9.5 9.2 9.5  Total Protein 6.5 - 8.1 g/dL 7.5 7.6 7.8  Total Bilirubin 0.3 - 1.2 mg/dL 0.3 0.4 0.4  Alkaline Phos 38 - 126 U/L 107 93 88  AST 15 - 41 U/L 24 39 25  ALT 0 - 44 U/L '18 25 17    ' Lab Results  Component Value Date   WBC 6.2 07/26/2020   HGB 11.2 (L) 07/26/2020   HCT 34.1 (L) 07/26/2020   MCV 92.4 07/26/2020   PLT 270 07/26/2020   NEUTROABS 3.9 07/26/2020    ASSESSMENT & PLAN:  Malignant neoplasm of overlapping sites of left breast in female, estrogen receptor negative ([place]) 04/25/2019:Patient palpated a left breast and axillary mass x49month. [place] the left breast showed a 3.6cm mass at the 1 o'clock position with 6 smaller  adjacent masses extending toward the nipple ranging in size from 1.1cm to 4.0cm. US of the left axilla showed five bulky lymph nodes, the largest measuring 4.2cm, and second largest measuring 2.8cm. 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 T1c N2 stage IIIc Based on PET CT scan showing bilateral cervical nodes, it is stage IV(however her goal is to cure given the oligometastatic nature of her cancer.)  01/10/2020:Bilateral mastectomies with reconstruction ([facility]ett & Pace): Right breast: no evidence of malignancy Left breast: no evidence of residual carcinoma and 4 benign lymph  nodes. --------------------------------------------------------------------------------------------------------------------------------------------------------------------------- Treatment plan: 1.Adjuvant radiationcompleted 04/18/2020 2.adjuvantPembrolizumab maintenance therapycompleted 07/26/2020  Immune mediated adverse effects: Hypothyroidism,on Synthroid, under the care of endocrinology..  08/10/20: No evidence of Mets. Liver enhancement likely benign, probably from fatty liver Based upon radiologist recommendation, I will order a liver MRI.  I will call her with the result of the liver MRI and discuss. She has a survivorship appointment in August  Orders Placed This Encounter  Procedures  . MR Abdomen W Wo Contrast    Standing Status:   Future    Standing Expiration Date:   08/13/2021    Order Specific Question:   If indicated for the ordered procedure, I authorize the administration of contrast media per Radiology protocol    Answer:   Yes    Order Specific Question:   What is the patient's sedation requirement?    Answer:   No Sedation    Order Specific Question:   Does the patient have a pacemaker or implanted devices?    Answer:   No    Order Specific Question:   Preferred imaging location?    Answer:   [facility] (table limit-550lbs)    Order Specific Question:   Release to patient    Answer:   Immediate   The patient has a good understanding of the overall plan. she agrees with it. she will call with any problems that may develop before the next visit here.  Total time spent: 12 mins including face to face time and time spent for planning, charting and coordination of care  Rulon Eisenmenger, MD, MPH 08/13/2020  I, Cloyde Reams Dorshimer, am acting as scribe for Dr. [name].  I have reviewed the above documentation for accuracy and completeness, and I agree with the above.

## 2020-08-13 ENCOUNTER — Ambulatory Visit: Payer: Self-pay | Admitting: Surgery

## 2020-08-13 ENCOUNTER — Inpatient Hospital Stay: Payer: 59 | Attending: Hematology and Oncology | Admitting: Hematology and Oncology

## 2020-08-13 DIAGNOSIS — Z171 Estrogen receptor negative status [ER-]: Secondary | ICD-10-CM

## 2020-08-13 DIAGNOSIS — C50812 Malignant neoplasm of overlapping sites of left female breast: Secondary | ICD-10-CM

## 2020-08-13 NOTE — H&P (Signed)
Heather Mckenzie Appointment: 08/13/2020 12:00 PM Location: [facility] Patient #: [id number] DOB: 02-May-1980 Married / Language: Cleophus Mckenzie / Race: White Female  History of Present Illness Marcello Moores A. Christy Friede MD; 08/13/2020 12:23 PM) Patient words: Patient returns for follow-up of her breast cancer. She has finished chemotherapy and no longer needs her port. She has done well and has completed reconstruction.  The patient is a 40 year old female.   Problem List/Past Medical Sharyn Lull R. Brooks, CMA; 08/13/2020 12:16 PM) AT HIGH RISK FOR BREAST CANCER (Z91.89) BREAST CANCER, LEFT (C50.912) POST-OPERATIVE STATE [phone])  Past Surgical History Sharyn Lull R. Rolena Infante, CMA; 08/13/2020 12:16 PM) Oral Surgery  Diagnostic Studies History Sharyn Lull R. Rolena Infante, CMA; 08/13/2020 12:16 PM) Colonoscopy never Mammogram within last year Pap Smear 1-5 years ago  Allergies Sharyn Lull R. Brooks, CMA; 08/13/2020 12:16 PM) No Known Allergies [04/26/2019]:  Medication History Sharyn Lull R. Brooks, CMA; 08/13/2020 12:17 PM) Levothyroxine Sodium (50MCG Tablet, Oral) Active. Medications Reconciled  Social History Sharyn Lull R. Brooks, CMA; 08/13/2020 12:16 PM) Caffeine use Tea. No alcohol use No drug use Tobacco use Never smoker.  Family History Sharyn Lull R. Rolena Infante, CMA; 08/13/2020 12:16 PM) Kidney Disease Father.  Pregnancy / Birth History Sharyn Lull R. Rolena Infante, CMA; 08/13/2020 12:16 PM) Age at menarche 64 years. Contraceptive History Intrauterine device, Oral contraceptives. Gravida 2 Maternal age 58-30 Para 2 Regular periods  Other Problems Sharyn Lull R. Rolena Infante, CMA; 08/13/2020 12:16 PM) Lump In Breast     Physical Exam (Nykole Matos A. Mandela Bello MD; 08/13/2020 12:24 PM)  General Mental Status-Alert. General Appearance-Consistent with stated age. Hydration-Well hydrated. Voice-Normal.  Breast Note: Both breasts are surgically absent. Both reconstructions are intact. Skin is healed  well. No masses. Port location below the right clavicle was intact without complicating feature  Neurologic Neurologic evaluation reveals -alert and oriented x 3 with no impairment of recent or remote memory. Mental Status-Normal.  Lymphatic Head & Neck  General Head & Neck Lymphatics: Bilateral - Description - Normal. Axillary  General Axillary Region: Bilateral - Description - Normal. Tenderness - Non Tender.    Assessment & Plan (Carry Weesner A. 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 I explained the technique of removal with use of local anesthesia & possible need for more aggressive sedation/anesthesia for patient comfort.  Risks such as bleeding, infection, and other risks were discussed. Post-operative dressing/incision care was discussed. I noted a good likelihood this will help address the problem. We will work to minimize complications. Questions were answered. The patient expresses understanding & wishes to proceed with surgery.  Pt Education - CCS Free Text Education/Instructions: discussed with patient and provided information.

## 2020-08-15 ENCOUNTER — Telehealth: Payer: Self-pay | Admitting: Hematology and Oncology

## 2020-08-15 ENCOUNTER — Encounter: Payer: Self-pay | Admitting: *Deleted

## 2020-08-15 NOTE — Telephone Encounter (Signed)
Scheduled per 5/2 los. Called and spoke with pt confirmed 5/17 appt

## 2020-08-15 NOTE — Progress Notes (Signed)
Received call from pt stating [facility] is unable to preform abdominal MRI due to breast tissue expanders having metal in them.  MD notified and states MRI is not needed at this time and can wait until tissue expanders are removed due to nothing of major significance being found on abdominal CT.  RN attempt x1 to contact pt.  No answer.  Unable to LVM due to VM being full.

## 2020-08-27 NOTE — Progress Notes (Signed)
HEMATOLOGY-ONCOLOGY TELEPHONE VISIT PROGRESS NOTE  I connected with Heather Mckenzie on 08/28/2020 at 11:15 AM EDT by telephone and verified that I am speaking with the correct person using two identifiers.  I discussed the limitations, risks, security and privacy concerns of performing an evaluation and management service by telephone and the availability of in person appointments.  I also discussed with the patient that there may be a patient responsible charge related to this service. The patient expressed understanding and agreed to proceed.   History of Present Illness: Heather Mckenzie is a 40 y.o. female with above-mentioned history of neoadjuvant chemotherapy, bilateral mastectomies, and is currently on maintenance therapy with pembrolizumab.She presents over the phone today for follow-up.   Oncology History  Malignant neoplasm of overlapping sites of left breast in female, estrogen receptor negative ([place])  04/25/2019 Initial Diagnosis   Patient palpated a left breast and axillary mass x81month. [place] the left breast showed a 3.6cm mass at the 1 o'clock position with 6 smaller adjacent masses extending toward the nipple ranging in size from 1.1cm to 4.0cm. [place] the left axilla showed five bulky lymph nodes, the largest measuring 4.2cm, and second largest measuring 2.8cm. 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    04/27/2019 Cancer Staging   Staging form: Breast, AJCC 8th Edition - Clinical: Stage IIIC (cT1c, cN2a, cM0, G3, ER-, PR-, HER2-) - Signed by GNicholas Lose MD on 04/27/2019   05/12/2019 - 10/27/2019 Chemotherapy   The patient had DOXOrubicin (ADRIAMYCIN) chemo injection 128 mg, 60 mg/m2 = 128 mg, Intravenous,  Once, 4 of 4 cycles Administration: 128 mg (05/12/2019), 128 mg (06/03/2019), 128 mg (06/23/2019), 128 mg (07/14/2019) palonosetron (ALOXI) injection 0.25 mg, 0.25 mg, Intravenous,  Once, 8 of 8 cycles Administration: 0.25 mg  (05/12/2019), 0.25 mg (08/04/2019), 0.25 mg (06/03/2019), 0.25 mg (09/01/2019), 0.25 mg (06/23/2019), 0.25 mg (07/14/2019), 0.25 mg (09/22/2019), 0.25 mg (10/13/2019) pegfilgrastim-cbqv (UDENYCA) injection 6 mg, 6 mg, Subcutaneous, Once, 4 of 4 cycles Administration: 6 mg (05/14/2019), 6 mg (06/06/2019), 6 mg (06/25/2019), 6 mg (07/16/2019) CARBOplatin (PARAPLATIN) 700 mg in sodium chloride 0.9 % 250 mL chemo infusion, 700 mg (100 % of original dose 700 mg), Intravenous,  Once, 4 of 4 cycles Dose modification: 700 mg (original dose 700 mg, Cycle 5) Administration: 700 mg (08/04/2019), 700 mg (09/01/2019), 700 mg (09/22/2019), 700 mg (10/13/2019) cyclophosphamide (CYTOXAN) 1,280 mg in sodium chloride 0.9 % 250 mL chemo infusion, 600 mg/m2 = 1,280 mg, Intravenous,  Once, 4 of 4 cycles Administration: 1,280 mg (05/12/2019), 1,280 mg (06/03/2019), 1,280 mg (06/23/2019), 1,280 mg (07/14/2019) PACLitaxel (TAXOL) 174 mg in sodium chloride 0.9 % 250 mL chemo infusion (</= 857mm2), 80 mg/m2 = 174 mg, Intravenous,  Once, 4 of 4 cycles Dose modification: 65 mg/m2 (original dose 80 mg/m2, Cycle 6, Reason: Dose not tolerated) Administration: 174 mg (08/04/2019), 174 mg (08/11/2019), 138 mg (09/01/2019), 138 mg (08/25/2019), 138 mg (09/08/2019), 138 mg (09/15/2019), 138 mg (09/22/2019), 138 mg (09/29/2019), 138 mg (10/07/2019), 138 mg (10/13/2019), 138 mg (10/21/2019), 138 mg (10/27/2019) fosaprepitant (EMEND) 150 mg in sodium chloride 0.9 % 145 mL IVPB, 150 mg, Intravenous,  Once, 8 of 8 cycles Administration: 150 mg (05/12/2019), 150 mg (08/04/2019), 150 mg (06/03/2019), 150 mg (09/01/2019), 150 mg (06/23/2019), 150 mg (07/14/2019), 150 mg (09/22/2019), 150 mg (10/13/2019) pembrolizumab (KEYTRUDA) 200 mg in sodium chloride 0.9 % 50 mL chemo infusion, 2 mg/kg = 200 mg, Intravenous, Once, 8 of  8 cycles Administration: 200 mg (05/12/2019), 200 mg (06/03/2019), 200 mg (06/23/2019), 200 mg (07/14/2019), 200 mg (08/04/2019), 200 mg (09/01/2019), 200 mg (09/22/2019), 200 mg  (10/13/2019)  for chemotherapy treatment.     Genetic Testing   Negative genetic testing. No pathogenic variants identified. VUS in HOXB13 called c.473C>A, VUS in POLD1 called c.1610G>C, and VUS in RAD50 called c.1094G>A identified on the [facility] Common Hereditary Cancers Panel. The report date is 05/10/2019.  The Common Hereditary Cancers Panel offered by [facility] includes sequencing and/or deletion duplication testing of the following 48 genes: APC, ATM, AXIN2, BARD1, BMPR1A, BRCA1, BRCA2, BRIP1, CDH1, CDKN2A (p14ARF), CDKN2A (p16INK4a), CKD4, CHEK2, CTNNA1, DICER1, EPCAM (Deletion/duplication testing only), GREM1 (promoter region deletion/duplication testing only), KIT, MEN1, MLH1, MSH2, MSH3, MSH6, MUTYH, NBN, NF1, NHTL1, PALB2, PDGFRA, PMS2, POLD1, POLE, PTEN, RAD50, RAD51C, RAD51D, RNF43, SDHB, SDHC, SDHD, SMAD4, SMARCA4. STK11, TP53, TSC1, TSC2, and VHL.  The following genes were evaluated for sequence changes only: SDHA and HOXB13 c.251G>A variant only.   01/10/2020 Surgery   Bilateral mastectomies with reconstruction ([facility] & Pace): Right breast: no evidence tof malignancy Left breast: no evidence of residual carcinoma and 4 benign lymph nodes.    02/10/2020 -  Chemotherapy    Patient is on Treatment Plan: BREAST PEMBROLIZUMAB Q21D      02/28/2020 -  Radiation Therapy   Adjuvant radiation     Observations/Objective:     Assessment Plan:  Malignant neoplasm of overlapping sites of left breast in female, estrogen receptor negative ([place]) 04/25/2019:Patient palpated a left breast and axillary mass x73month. [place] the left breast showed a 3.6cm mass at the 1 o'clock position with 6 smaller adjacent masses extending toward the nipple ranging in size from 1.1cm to 4.0cm. [place] the left axilla showed five bulky lymph nodes, the largest measuring 4.2cm, and second largest measuring 2.8cm. 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 T1c N2 stage  IIIc Based on PET CT scan showing bilateral cervical nodes, it is stage IV(however her goal is to cure given the oligometastatic nature of her cancer.)  01/10/2020:Bilateral mastectomies with reconstruction ([facility] & Pace): Right breast: no evidence of malignancy Left breast: no evidence of residual carcinoma and 4 benign lymph nodes. --------------------------------------------------------------------------------------------------------------------------------------------------------------------------- Treatment plan: 1.Adjuvant radiationcompleted 04/18/2020 2.adjuvantPembrolizumab maintenance therapycompleted 07/26/2020  Immune mediated adverse effects: Hypothyroidism,on Synthroid, under the care of endocrinology.  08/10/20: No evidence of Mets. Liver enhancement likely benign, probably from fatty liver We cannot perform a liver MRI because she has tissue expanders. Therefore we will wait for her plastic surgery to be completed and then obtain a liver MRI.  We will set her up in 3 to 4 months [facility]  I discussed the assessment and treatment plan with the patient. The patient was provided an opportunity to ask questions and all were answered. The patient agreed with the plan and demonstrated an understanding of the instructions. The patient was advised to call back or seek an in-person evaluation if the symptoms worsen or if the condition fails to improve as anticipated.   Total time spent: 12 mins including non-face to face time and time spent for planning, charting and coordination of care  VRulon Eisenmenger MD 08/28/2020    I, MCloyde ReamsDorshimer, am acting as scribe for VNicholas Lose MD.  I have reviewed the above documentation for accuracy and completeness, and I agree with the above.

## 2020-08-28 ENCOUNTER — Inpatient Hospital Stay (HOSPITAL_BASED_OUTPATIENT_CLINIC_OR_DEPARTMENT_OTHER): Payer: 59 | Admitting: Hematology and Oncology

## 2020-08-28 DIAGNOSIS — Z171 Estrogen receptor negative status [ER-]: Secondary | ICD-10-CM

## 2020-08-28 DIAGNOSIS — C50812 Malignant neoplasm of overlapping sites of left female breast: Secondary | ICD-10-CM

## 2020-08-28 NOTE — Assessment & Plan Note (Signed)
04/25/2019:Patient palpated a left breast and axillary mass x17month. [place] the left breast showed a 3.6cm mass at the 1 o'clock position with 6 smaller adjacent masses extending toward the nipple ranging in size from 1.1cm to 4.0cm. [place] the left axilla showed five bulky lymph nodes, the largest measuring 4.2cm, and second largest measuring 2.8cm. 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 T1c N2 stage IIIc Based on PET CT scan showing bilateral cervical nodes, it is stage IV(however her goal is to cure given the oligometastatic nature of her cancer.)  01/10/2020:Bilateral mastectomies with reconstruction ([facility]ett & Pace): Right breast: no evidence of malignancy Left breast: no evidence of residual carcinoma and 4 benign lymph nodes. --------------------------------------------------------------------------------------------------------------------------------------------------------------------------- Treatment plan: 1.Adjuvant radiationcompleted 04/18/2020 2.adjuvantPembrolizumab maintenance therapycompleted 07/26/2020  Immune mediated adverse effects: Hypothyroidism,on Synthroid, under the care of endocrinology..  08/10/20: No evidence of Mets. Liver enhancement likely benign, probably from fatty liver Based upon radiologist recommendation, I will order a liver MRI.  I will call her with the result of the liver MRI and discuss. She has a survivorship appointment in August

## 2020-09-03 ENCOUNTER — Other Ambulatory Visit (HOSPITAL_COMMUNITY): Payer: Self-pay

## 2020-09-06 ENCOUNTER — Encounter (HOSPITAL_BASED_OUTPATIENT_CLINIC_OR_DEPARTMENT_OTHER): Payer: Self-pay

## 2020-09-06 ENCOUNTER — Ambulatory Visit (HOSPITAL_BASED_OUTPATIENT_CLINIC_OR_DEPARTMENT_OTHER): Admit: 2020-09-06 | Payer: 59 | Admitting: Surgery

## 2020-09-06 SURGERY — REMOVAL PORT-A-CATH
Anesthesia: General

## 2020-09-12 ENCOUNTER — Ambulatory Visit: Payer: 59 | Admitting: Plastic Surgery

## 2020-09-12 ENCOUNTER — Other Ambulatory Visit: Payer: Self-pay

## 2020-09-12 ENCOUNTER — Ambulatory Visit (INDEPENDENT_AMBULATORY_CARE_PROVIDER_SITE_OTHER): Payer: Self-pay | Admitting: Plastic Surgery

## 2020-09-12 DIAGNOSIS — Z171 Estrogen receptor negative status [ER-]: Secondary | ICD-10-CM

## 2020-09-12 DIAGNOSIS — C50812 Malignant neoplasm of overlapping sites of left female breast: Secondary | ICD-10-CM

## 2020-09-12 NOTE — Progress Notes (Signed)
   Referring Provider No referring provider defined for this encounter.   CC:  Chief Complaint  Patient presents with  . Follow-up      Heather Mckenzie is an 40 y.o. female.  HPI: Patient presents after bilateral breast reconstruction with tissue expanders and acellular dermal matrix.  She completed radiation about 5 months ago.  She would like to be a little bit bigger but is overall happy with how things are going.  She points out some decreased fullness in the upper pole bilaterally left worse than right.  The left side is the side that got the radiation.  She also has some excess skin medially on the right side and laterally extending into both axilla.  Review of Systems General: Denies fevers and chills  Physical Exam Vitals with BMI 07/26/2020 07/05/2020 06/15/2020  Height 5\' 3"  5\' 3"  5\' 3"   Weight 216 lbs 13 oz 220 lbs 3 oz 213 lbs 13 oz  BMI 38.41 07.68 08.81  Systolic 103 159 458  Diastolic 83 76 81  Pulse 77 79 80    General:  No acute distress,  Alert and oriented, Non-Toxic, Normal speech and affect Examination shows an overall symmetric reconstruction.  She has mild radiation changes on the left side.  There is upper pole hollowing to a mild degree left worse than right.  There is a medial dogear on the right side and excess axillary tissue bilaterally.  Assessment/Plan Patient is overall doing well after reconstruction.  She currently has 620 cc and a 550 cc expander.  I added 50 cc to both sides so she now has 670 cc in her 550 cc expander.  She believes that this is going to get her to the size that she wants to be.  I think it is reasonable to plan to do her implant exchange a month or 2 from now.  I would plan to do fat grafting the upper pole to same time.  I could also do some skin excisions medially on the right side and laterally on both sides to improve the overall shape and contour.  We discussed the risks of the procedure that include bleeding, infection, damage to  surrounding structures and need for additional procedures.  All of her questions were answered we will plan to move ahead and get it scheduled.  Heather Mckenzie 09/12/2020, 2:52 PM

## 2020-10-01 ENCOUNTER — Encounter: Payer: Self-pay | Admitting: Hematology and Oncology

## 2020-10-19 ENCOUNTER — Encounter: Payer: Self-pay | Admitting: Surgical

## 2020-10-22 ENCOUNTER — Other Ambulatory Visit: Payer: Self-pay

## 2020-10-22 ENCOUNTER — Ambulatory Visit (INDEPENDENT_AMBULATORY_CARE_PROVIDER_SITE_OTHER): Payer: 59

## 2020-10-22 ENCOUNTER — Ambulatory Visit: Payer: 59 | Attending: Surgery

## 2020-10-22 VITALS — BP 120/75 | HR 82 | Temp 97.8°F | Ht 63.0 in | Wt 215.0 lb

## 2020-10-22 DIAGNOSIS — Z9013 Acquired absence of bilateral breasts and nipples: Secondary | ICD-10-CM

## 2020-10-22 DIAGNOSIS — Z483 Aftercare following surgery for neoplasm: Secondary | ICD-10-CM | POA: Insufficient documentation
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
## 2020-10-23 ENCOUNTER — Ambulatory Visit (HOSPITAL_BASED_OUTPATIENT_CLINIC_OR_DEPARTMENT_OTHER): Admit: 2020-10-23 | Payer: 59 | Admitting: Surgery

## 2020-10-23 ENCOUNTER — Encounter (HOSPITAL_BASED_OUTPATIENT_CLINIC_OR_DEPARTMENT_OTHER): Payer: Self-pay

## 2020-10-23 SURGERY — REMOVAL PORT-A-CATH
Anesthesia: Monitor Anesthesia Care | Laterality: Left

## 2020-10-29 ENCOUNTER — Other Ambulatory Visit: Payer: Self-pay

## 2020-10-29 ENCOUNTER — Encounter (HOSPITAL_BASED_OUTPATIENT_CLINIC_OR_DEPARTMENT_OTHER): Payer: Self-pay | Admitting: Plastic Surgery

## 2020-11-05 ENCOUNTER — Encounter (HOSPITAL_COMMUNITY)
Admission: RE | Admit: 2020-11-05 | Discharge: 2020-11-05 | Disposition: A | Discharge status: Home / Self Care | Payer: 59 | Source: Ambulatory Visit | Attending: Plastic Surgery | Admitting: Plastic Surgery

## 2020-11-05 ENCOUNTER — Other Ambulatory Visit: Payer: Self-pay

## 2020-11-05 DIAGNOSIS — Z01812 Encounter for preprocedural laboratory examination: Secondary | ICD-10-CM | POA: Diagnosis not present

## 2020-11-05 LAB — CBC WITH DIFFERENTIAL/PLATELET
Abs Immature Granulocytes: 0.04 10*3/uL (ref 0.00–0.07)
Basophils Absolute: 0.1 10*3/uL (ref 0.0–0.1)
Basophils Relative: 1 %
Eosinophils Absolute: 0.2 10*3/uL (ref 0.0–0.5)
Eosinophils Relative: 4 %
HCT: 36.8 % (ref 36.0–46.0)
Hemoglobin: 12 g/dL (ref 12.0–15.0)
Immature Granulocytes: 1 %
Lymphocytes Relative: 30 %
Lymphs Abs: 1.7 10*3/uL (ref 0.7–4.0)
MCH: 31.1 pg (ref 26.0–34.0)
MCHC: 32.6 g/dL (ref 30.0–36.0)
MCV: 95.3 fL (ref 80.0–100.0)
Monocytes Absolute: 0.4 10*3/uL (ref 0.1–1.0)
Monocytes Relative: 8 %
Neutro Abs: 3.3 10*3/uL (ref 1.7–7.7)
Neutrophils Relative %: 56 %
Platelets: 235 10*3/uL (ref 150–400)
RBC: 3.86 MIL/uL — ABNORMAL LOW (ref 3.87–5.11)
RDW: 14.1 % (ref 11.5–15.5)
WBC: 5.8 10*3/uL (ref 4.0–10.5)
nRBC: 0 % (ref 0.0–0.2)

## 2020-11-05 LAB — COMPREHENSIVE METABOLIC PANEL
ALT: 39 U/L (ref 0–44)
AST: 40 U/L (ref 15–41)
Albumin: 4.2 g/dL (ref 3.5–5.0)
Alkaline Phosphatase: 83 U/L (ref 38–126)
Anion gap: 7 (ref 5–15)
BUN: 15 mg/dL (ref 6–20)
CO2: 29 mmol/L (ref 22–32)
Calcium: 9.3 mg/dL (ref 8.9–10.3)
Chloride: 102 mmol/L (ref 98–111)
Creatinine, Ser: 0.92 mg/dL (ref 0.44–1.00)
GFR, Estimated: >60 mL/min (ref >60)
Glucose, Bld: 94 mg/dL (ref 70–99)
Potassium: 4 mmol/L (ref 3.5–5.1)
Sodium: 138 mmol/L (ref 135–145)
Total Bilirubin: 0.8 mg/dL (ref 0.3–1.2)
Total Protein: 7.8 g/dL (ref 6.5–8.1)
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
## 2020-11-05 NOTE — Anesthesia Preprocedure Evaluation (Addendum)
Anesthesia Evaluation  Patient identified by MRN, date of birth, ID band Patient awake    Reviewed: Allergy & Precautions, NPO status , Patient's Chart, lab work & pertinent test results  Airway Mallampati: II  TM Distance: >3 FB Neck ROM: Full    Dental no notable dental hx. (+) Teeth Intact, Dental Advisory Given, Poor Dentition, Missing,    Pulmonary neg pulmonary ROS,    Pulmonary exam normal breath sounds clear to auscultation       Cardiovascular negative cardio ROS Normal cardiovascular exam Rhythm:Regular Rate:Normal     Neuro/Psych negative neurological ROS  negative psych ROS   GI/Hepatic negative GI ROS,   Endo/Other  Hypothyroidism   Renal/GU negative Renal ROS  negative genitourinary   Musculoskeletal negative musculoskeletal ROS (+)   Abdominal   Peds negative pediatric ROS (+)  Hematology  (+) Blood dyscrasia, anemia ,   Anesthesia Other Findings   Reproductive/Obstetrics negative OB ROS                           Anesthesia Physical  Anesthesia Plan  ASA: 2  Anesthesia Plan: General   Post-op Pain Management:    Induction: Intravenous  PONV Risk Score and Plan: 3 and Ondansetron, Midazolam and Treatment may vary due to age or medical condition  Airway Management Planned: LMA and Oral ETT  Additional Equipment: None  Intra-op Plan:   Post-operative Plan: Extubation in OR  Informed Consent: I have reviewed the patients History and Physical, chart, labs and discussed the procedure including the risks, benefits and alternatives for the proposed anesthesia with the patient or authorized representative who has indicated his/her understanding and acceptance.       Plan Discussed with: CRNA, Surgeon and Anesthesiologist  Anesthesia Plan Comments: (PAT note written 01/05/2020 by Myra Gianotti, PA-C. )       Anesthesia Quick Evaluation

## 2020-11-05 NOTE — Patient Instructions (Signed)
Patient will call for any changes or concerns 

## 2020-11-06 ENCOUNTER — Ambulatory Visit (HOSPITAL_BASED_OUTPATIENT_CLINIC_OR_DEPARTMENT_OTHER)
Admission: RE | Admit: 2020-11-06 | Discharge: 2020-11-06 | Disposition: A | Discharge status: Home / Self Care | Payer: 59 | Attending: Plastic Surgery | Admitting: Plastic Surgery

## 2020-11-06 ENCOUNTER — Ambulatory Visit (HOSPITAL_BASED_OUTPATIENT_CLINIC_OR_DEPARTMENT_OTHER): Payer: 59 | Admitting: Anesthesiology

## 2020-11-06 ENCOUNTER — Encounter (HOSPITAL_BASED_OUTPATIENT_CLINIC_OR_DEPARTMENT_OTHER)
Admission: RE | Disposition: A | Discharge status: Home / Self Care | Payer: Self-pay | Source: Home / Self Care | Attending: Plastic Surgery

## 2020-11-06 ENCOUNTER — Encounter (HOSPITAL_BASED_OUTPATIENT_CLINIC_OR_DEPARTMENT_OTHER): Payer: Self-pay | Admitting: Plastic Surgery

## 2020-11-06 ENCOUNTER — Other Ambulatory Visit: Payer: Self-pay

## 2020-11-06 DIAGNOSIS — C50812 Malignant neoplasm of overlapping sites of left female breast: Secondary | ICD-10-CM | POA: Insufficient documentation

## 2020-11-06 DIAGNOSIS — Z452 Encounter for adjustment and management of vascular access device: Secondary | ICD-10-CM | POA: Insufficient documentation

## 2020-11-06 DIAGNOSIS — Z975 Presence of (intrauterine) contraceptive device: Secondary | ICD-10-CM | POA: Insufficient documentation

## 2020-11-06 DIAGNOSIS — Z171 Estrogen receptor negative status [ER-]: Secondary | ICD-10-CM | POA: Insufficient documentation

## 2020-11-06 HISTORY — PX: BREAST CYST EXCISION: SHX579

## 2020-11-06 HISTORY — DX: Hypothyroidism, unspecified: E03.9

## 2020-11-06 HISTORY — PX: LIPOSUCTION WITH LIPOFILLING: SHX6436

## 2020-11-06 HISTORY — PX: PORT-A-CATH REMOVAL: SHX5289

## 2020-11-06 HISTORY — PX: REMOVAL OF BILATERAL TISSUE EXPANDERS WITH PLACEMENT OF BILATERAL BREAST IMPLANTS: SHX6431

## 2020-11-06 LAB — POCT PREGNANCY, URINE: Preg Test, Ur: NEGATIVE

## 2020-11-06 SURGERY — REMOVAL, TISSUE EXPANDER, BREAST, BILATERAL, WITH BILATERAL IMPLANT IMPLANT INSERTION
Anesthesia: General | Site: Breast

## 2020-11-06 MED ORDER — FENTANYL CITRATE (PF) 100 MCG/2ML IJ SOLN
25.0000 ug | INTRAMUSCULAR | Status: DC | PRN
  Administered 2020-11-06: 50 ug via INTRAVENOUS

## 2020-11-06 MED ORDER — CEFAZOLIN IN SODIUM CHLORIDE 3-0.9 GM/100ML-% IV SOLN: 3.0000 g | INTRAVENOUS | Status: DC

## 2020-11-06 MED ORDER — FENTANYL CITRATE (PF) 100 MCG/2ML IJ SOLN
INTRAMUSCULAR | Status: DC | PRN
  Administered 2020-11-06 (×2): 50 ug via INTRAVENOUS

## 2020-11-06 MED ORDER — CEFAZOLIN SODIUM-DEXTROSE 2-4 GM/100ML-% IV SOLN
INTRAVENOUS | Status: AC
  Filled 2020-11-06: qty 100

## 2020-11-06 MED ORDER — LIDOCAINE HCL (PF) 2 % IJ SOLN
INTRAMUSCULAR | Status: AC
  Filled 2020-11-06: qty 5

## 2020-11-06 MED ORDER — ONDANSETRON HCL 4 MG/2ML IJ SOLN
INTRAMUSCULAR | Status: DC | PRN
  Administered 2020-11-06: 4 mg via INTRAVENOUS

## 2020-11-06 MED ORDER — CHLORHEXIDINE GLUCONATE CLOTH 2 % EX PADS: 6.0000 | MEDICATED_PAD | Freq: Once | CUTANEOUS | Status: DC

## 2020-11-06 MED ORDER — BUPIVACAINE-EPINEPHRINE (PF) 0.25% -1:200000 IJ SOLN
INTRAMUSCULAR | Status: AC
  Filled 2020-11-06: qty 30

## 2020-11-06 MED ORDER — OXYCODONE HCL 5 MG/5ML PO SOLN
5.0000 mg | Freq: Once | ORAL | Status: DC | PRN
Start: 2020-11-06 — End: 2020-11-06

## 2020-11-06 MED ORDER — MEPERIDINE HCL 25 MG/ML IJ SOLN: 6.2500 mg | INTRAMUSCULAR | Status: DC | PRN

## 2020-11-06 MED ORDER — ACETAMINOPHEN 160 MG/5ML PO SOLN: 325.0000 mg | ORAL | Status: DC | PRN

## 2020-11-06 MED ORDER — FENTANYL CITRATE (PF) 100 MCG/2ML IJ SOLN
INTRAMUSCULAR | Status: AC
  Filled 2020-11-06: qty 2

## 2020-11-06 MED ORDER — OXYCODONE HCL 5 MG PO TABS: 5.0000 mg | ORAL_TABLET | Freq: Once | ORAL | Status: DC | PRN

## 2020-11-06 MED ORDER — DEXAMETHASONE SODIUM PHOSPHATE 4 MG/ML IJ SOLN
INTRAMUSCULAR | Status: DC | PRN
  Administered 2020-11-06: 10 mg via INTRAVENOUS

## 2020-11-06 MED ORDER — FENTANYL CITRATE (PF) 100 MCG/2ML IJ SOLN: 25.0000 ug | INTRAMUSCULAR | Status: DC | PRN

## 2020-11-06 MED ORDER — EPINEPHRINE PF 1 MG/ML IJ SOLN
INTRAMUSCULAR | Status: AC
  Filled 2020-11-06: qty 1

## 2020-11-06 MED ORDER — LACTATED RINGERS IV SOLN
INTRAVENOUS | Status: DC | PRN
  Administered 2020-11-06: 1000 mL

## 2020-11-06 MED ORDER — SODIUM CHLORIDE 0.9 % IV SOLN
INTRAVENOUS | Status: AC
  Filled 2020-11-06: qty 10

## 2020-11-06 MED ORDER — ONDANSETRON HCL 4 MG/2ML IJ SOLN: 4.0000 mg | Freq: Once | INTRAMUSCULAR | Status: DC | PRN

## 2020-11-06 MED ORDER — OXYCODONE HCL 5 MG/5ML PO SOLN: 5.0000 mg | Freq: Once | ORAL | Status: AC | PRN

## 2020-11-06 MED ORDER — ACETAMINOPHEN 325 MG PO TABS: 325.0000 mg | ORAL_TABLET | ORAL | Status: DC | PRN

## 2020-11-06 MED ORDER — CEFAZOLIN SODIUM-DEXTROSE 2-4 GM/100ML-% IV SOLN
2.0000 g | INTRAVENOUS | Status: AC
  Administered 2020-11-06: 2 g via INTRAVENOUS

## 2020-11-06 MED ORDER — LIDOCAINE HCL (CARDIAC) PF 100 MG/5ML IV SOSY
PREFILLED_SYRINGE | INTRAVENOUS | Status: DC | PRN
  Administered 2020-11-06: 80 mg via INTRAVENOUS

## 2020-11-06 MED ORDER — HYDROCODONE-ACETAMINOPHEN 5-325 MG PO TABS: 1.0000 | ORAL_TABLET | Freq: Four times a day (QID) | ORAL | 0 refills | Status: DC | PRN

## 2020-11-06 MED ORDER — OXYCODONE HCL 5 MG PO TABS
ORAL_TABLET | ORAL | Status: AC
  Filled 2020-11-06: qty 1

## 2020-11-06 MED ORDER — BUPIVACAINE-EPINEPHRINE 0.25% -1:200000 IJ SOLN
INTRAMUSCULAR | Status: DC | PRN
  Administered 2020-11-06: 8 mL

## 2020-11-06 MED ORDER — PROPOFOL 10 MG/ML IV BOLUS
INTRAVENOUS | Status: DC | PRN
  Administered 2020-11-06: 160 mg via INTRAVENOUS
  Administered 2020-11-06: 40 mg via INTRAVENOUS

## 2020-11-06 MED ORDER — LACTATED RINGERS IV SOLN: INTRAVENOUS | Status: DC

## 2020-11-06 MED ORDER — BUPIVACAINE HCL (PF) 0.25 % IJ SOLN
INTRAMUSCULAR | Status: AC
  Filled 2020-11-06: qty 30

## 2020-11-06 MED ORDER — ONDANSETRON HCL 4 MG PO TABS: 4.0000 mg | ORAL_TABLET | Freq: Three times a day (TID) | ORAL | 0 refills | Status: DC | PRN

## 2020-11-06 MED ORDER — MIDAZOLAM HCL 5 MG/5ML IJ SOLN
INTRAMUSCULAR | Status: DC | PRN
  Administered 2020-11-06: 2 mg via INTRAVENOUS

## 2020-11-06 MED ORDER — PHENYLEPHRINE HCL (PRESSORS) 10 MG/ML IV SOLN
INTRAVENOUS | Status: DC | PRN
  Administered 2020-11-06: 80 ug via INTRAVENOUS
  Administered 2020-11-06: 40 ug via INTRAVENOUS

## 2020-11-06 MED ORDER — MIDAZOLAM HCL 2 MG/2ML IJ SOLN
INTRAMUSCULAR | Status: AC
  Filled 2020-11-06: qty 2

## 2020-11-06 MED ORDER — OXYCODONE HCL 5 MG PO TABS
5.0000 mg | ORAL_TABLET | Freq: Once | ORAL | Status: AC | PRN
  Administered 2020-11-06: 5 mg via ORAL

## 2020-11-06 SURGICAL SUPPLY — 84 items
BAG DECANTER FOR FLEXI CONT (MISCELLANEOUS) ×3 IMPLANT
BENZOIN TINCTURE PRP APPL 2/3 (GAUZE/BANDAGES/DRESSINGS) ×6 IMPLANT
BINDER ABDOMINAL 10 UNV 27-48 (MISCELLANEOUS) ×3 IMPLANT
BINDER ABDOMINAL 12 SM 30-45 (SOFTGOODS) IMPLANT
BLADE SURG 10 STRL SS (BLADE) ×3 IMPLANT
BLADE SURG 11 STRL SS (BLADE) ×3 IMPLANT
BLADE SURG 15 STRL LF DISP TIS (BLADE) ×4 IMPLANT
BLADE SURG 15 STRL SS (BLADE) ×6
BNDG ELASTIC 6X10 VLCR STRL LF (GAUZE/BANDAGES/DRESSINGS) ×3 IMPLANT
CANISTER LIPO FAT HARVEST (MISCELLANEOUS) IMPLANT
CANISTER SUCT 1200ML W/VALVE (MISCELLANEOUS) ×3 IMPLANT
CHLORAPREP W/TINT 26 (MISCELLANEOUS) ×6 IMPLANT
COVER BACK TABLE 60X90IN (DRAPES) ×3 IMPLANT
COVER MAYO STAND STRL (DRAPES) ×3 IMPLANT
DERMABOND ADVANCED (GAUZE/BANDAGES/DRESSINGS) ×1
DERMABOND ADVANCED .7 DNX12 (GAUZE/BANDAGES/DRESSINGS) ×2 IMPLANT
DRAIN CHANNEL 15F RND FF W/TCR (WOUND CARE) IMPLANT
DRAPE LAPAROSCOPIC ABDOMINAL (DRAPES) ×3 IMPLANT
DRAPE TOP ARMCOVERS (MISCELLANEOUS) IMPLANT
DRAPE U-SHAPE 76X120 STRL (DRAPES) ×3 IMPLANT
DRAPE UTILITY XL STRL (DRAPES) ×6 IMPLANT
DRSG PAD ABDOMINAL 8X10 ST (GAUZE/BANDAGES/DRESSINGS) ×9 IMPLANT
ELECT REM PT RETURN 9FT ADLT (ELECTROSURGICAL) ×3
ELECTRODE REM PT RTRN 9FT ADLT (ELECTROSURGICAL) ×2 IMPLANT
EVACUATOR SILICONE 100CC (DRAIN) IMPLANT
EXTRACTOR CANIST REVOLVE STRL (CANNISTER) IMPLANT
FUNNEL KELLER 2 DISP (MISCELLANEOUS) ×3 IMPLANT
GAUZE SPONGE 4X4 12PLY STRL (GAUZE/BANDAGES/DRESSINGS) ×6 IMPLANT
GLOVE SRG 8 PF TXTR STRL LF DI (GLOVE) ×4 IMPLANT
GLOVE SURG ENC MOIS LTX SZ6 (GLOVE) ×6 IMPLANT
GLOVE SURG ENC TEXT LTX SZ7.5 (GLOVE) ×3 IMPLANT
GLOVE SURG LTX SZ8 (GLOVE) ×3 IMPLANT
GLOVE SURG UNDER POLY LF SZ8 (GLOVE) ×6
GOWN STRL REUS W/ TWL LRG LVL3 (GOWN DISPOSABLE) ×8 IMPLANT
GOWN STRL REUS W/ TWL XL LVL3 (GOWN DISPOSABLE) ×2 IMPLANT
GOWN STRL REUS W/TWL LRG LVL3 (GOWN DISPOSABLE) ×12
GOWN STRL REUS W/TWL XL LVL3 (GOWN DISPOSABLE) ×3
IMPL BREAST P6.7XHI RND 755 (Breast) ×4 IMPLANT
IMPL BRST P6.7XHI RND 755CC (Breast) ×4 IMPLANT
IMPLANT BREAST GEL 755CC (Breast) ×6 IMPLANT
LINER CANISTER 1000CC FLEX (MISCELLANEOUS) ×3 IMPLANT
NDL SAFETY ECLIPSE 18X1.5 (NEEDLE) ×2 IMPLANT
NEEDLE HYPO 18GX1.5 SHARP (NEEDLE) ×3
NEEDLE HYPO 25X1 1.5 SAFETY (NEEDLE) ×6 IMPLANT
NS IRRIG 1000ML POUR BTL (IV SOLUTION) ×3 IMPLANT
PACK BASIN DAY SURGERY FS (CUSTOM PROCEDURE TRAY) ×6 IMPLANT
PAD ALCOHOL SWAB (MISCELLANEOUS) ×3 IMPLANT
PENCIL SMOKE EVACUATOR (MISCELLANEOUS) ×3 IMPLANT
PIN SAFETY STERILE (MISCELLANEOUS) IMPLANT
SHEET MEDIUM DRAPE 40X70 STRL (DRAPES) ×6 IMPLANT
SIZER BREAST REUSE 755CC (SIZER) ×6
SIZER BRST REUSE P6.7XHI 755CC (SIZER) ×4 IMPLANT
SLEEVE SCD COMPRESS KNEE MED (STOCKING) ×3 IMPLANT
SPONGE T-LAP 18X18 ~~LOC~~+RFID (SPONGE) ×3 IMPLANT
STAPLER VISISTAT 35W (STAPLE) ×3 IMPLANT
STRIP CLOSURE SKIN 1/2X4 (GAUZE/BANDAGES/DRESSINGS) ×3 IMPLANT
STRIP SUTURE WOUND CLOSURE 1/2 (MISCELLANEOUS) ×6 IMPLANT
SUT CHROMIC 5 0 RB 1 27 (SUTURE) ×3 IMPLANT
SUT ETHILON 2 0 FS 18 (SUTURE) IMPLANT
SUT MNCRL AB 4-0 PS2 18 (SUTURE) IMPLANT
SUT MON AB 4-0 PC3 18 (SUTURE) ×3 IMPLANT
SUT PDS 3-0 CT2 (SUTURE) ×6
SUT PDS II 3-0 CT2 27 ABS (SUTURE) ×4 IMPLANT
SUT SILK 2 0 SH (SUTURE) IMPLANT
SUT VIC AB 3-0 PS1 18 (SUTURE)
SUT VIC AB 3-0 PS1 18XBRD (SUTURE) IMPLANT
SUT VIC AB 3-0 SH 27 (SUTURE)
SUT VIC AB 3-0 SH 27X BRD (SUTURE) IMPLANT
SUT VICRYL 3-0 CR8 SH (SUTURE) ×3 IMPLANT
SUT VICRYL 4-0 PS2 18IN ABS (SUTURE) IMPLANT
SUT VLOC 180 0 24IN GS25 (SUTURE) IMPLANT
SUT VLOC 90 P-14 23 (SUTURE) ×3 IMPLANT
SYR 10ML LL (SYRINGE) ×9 IMPLANT
SYR 50ML LL SCALE MARK (SYRINGE) IMPLANT
SYR BULB IRRIG 60ML STRL (SYRINGE) ×3 IMPLANT
SYR CONTROL 10ML LL (SYRINGE) ×6 IMPLANT
SYR TB 1ML LL NO SAFETY (SYRINGE) ×3 IMPLANT
SYR TOOMEY IRRIG 70ML (MISCELLANEOUS)
SYRINGE TOOMEY IRRIG 70ML (MISCELLANEOUS) IMPLANT
TOWEL GREEN STERILE FF (TOWEL DISPOSABLE) ×9 IMPLANT
TUBE CONNECTING 20X1/4 (TUBING) ×3 IMPLANT
TUBING INFILTRATION IT-10001 (TUBING) ×3 IMPLANT
UNDERPAD 30X36 HEAVY ABSORB (UNDERPADS AND DIAPERS) ×6 IMPLANT
YANKAUER SUCT BULB TIP NO VENT (SUCTIONS) ×3 IMPLANT

## 2020-11-06 NOTE — Interval H&P Note (Signed)
History and Physical Interval Note:  11/06/2020 9:58 AM  Heather Mckenzie  has presented today for surgery, with the diagnosis of Malignant neoplasm of overlapping sites of left breast in female, estrogen receptor negative.  The various methods of treatment have been discussed with the patient and family. After consideration of risks, benefits and other options for treatment, the patient has consented to  Procedure(s) with comments: Exchange of bilateral breast tissue expanders for gel implants (Bilateral) - 2 hours total Bilateral breast fat grafting (Bilateral) Excision of excess skin bilaterally (Bilateral) REMOVAL PORT-A-CATH (N/A) as a surgical intervention.  The patient's history has been reviewed, patient examined, no change in status, stable for surgery.  I have reviewed the patient's chart and labs.  Questions were answered to the patient's satisfaction.     [place]

## 2020-11-06 NOTE — Transfer of Care (Signed)
Immediate Anesthesia Transfer of Care Note  Patient: Heather Mckenzie  Procedure(s) Performed: Exchange of bilateral breast tissue expanders for gel implants (Bilateral: Breast) Bilateral breast fat grafting (Bilateral: Breast) Excision of excess skin bilaterally (Bilateral: Breast) REMOVAL PORT-A-CATH  Patient Location: PACU  Anesthesia Type:General  Level of Consciousness: awake  Airway & Oxygen Therapy: Patient Spontanous Breathing and Patient connected to face mask oxygen  Post-op Assessment: Report given to RN and Post -op Vital signs reviewed and stable  Post vital signs: Reviewed and stable  Last Vitals:  Vitals Value Taken Time  BP    Temp    Pulse 95 11/06/20 1219  Resp 14 11/06/20 1219  SpO2 97 % 11/06/20 1219  Vitals shown include unvalidated device data.  Last Pain:  Vitals:   11/06/20 0901  TempSrc: Oral  PainSc: 0-No pain         Complications: No notable events documented.

## 2020-11-06 NOTE — Anesthesia Procedure Notes (Signed)
Procedure Name: LMA Insertion Date/Time: 11/06/2020 10:16 AM Performed by: Tawni Millers, CRNA Pre-anesthesia Checklist: Patient identified, Emergency Drugs available, Suction available and Patient being monitored Patient Re-evaluated:Patient Re-evaluated prior to induction Oxygen Delivery Method: Circle system utilized Preoxygenation: Pre-oxygenation with 100% oxygen Induction Type: IV induction Ventilation: Mask ventilation without difficulty LMA: LMA inserted LMA Size: 4.0 Number of attempts: 1 Airway Equipment and Method: Bite block Placement Confirmation: positive ETCO2 Tube secured with: Tape Dental Injury: Teeth and Oropharynx as per pre-operative assessment

## 2020-11-06 NOTE — Progress Notes (Signed)
Patient's tooth found in mouth in PACU. Dr. Ambrose Pancoast at bedside.

## 2020-11-06 NOTE — Anesthesia Postprocedure Evaluation (Signed)
Anesthesia Post Note  Patient: Heather Mckenzie  Procedure(s) Performed: Exchange of bilateral breast tissue expanders for gel implants (Bilateral: Breast) Bilateral breast fat grafting (Bilateral: Breast) Excision of excess skin bilaterally (Bilateral: Breast) REMOVAL PORT-A-CATH     Patient location during evaluation: PACU Anesthesia Type: General Level of consciousness: awake and alert Pain management: pain level controlled Vital Signs Assessment: post-procedure vital signs reviewed and stable Respiratory status: spontaneous breathing, nonlabored ventilation, respiratory function stable and patient connected to nasal cannula oxygen Cardiovascular status: blood pressure returned to baseline and stable Postop Assessment: no apparent nausea or vomiting Anesthetic complications: no Comments: Pt Number 25 tooth lower incisor found in mouth in recovery room.  On exam tooth is broken at base and clearly diseased with carries.  Tooth preserved in container for patient and picture placed in chart.  Tooth has extreme carries.    No notable events documented.  Last Vitals:  Vitals:   11/06/20 1230 11/06/20 1245  BP: 124/89 115/86  Pulse: 82 80  Resp: 10 (!) 9  Temp:    SpO2: 100% 99%    Last Pain:  Vitals:   11/06/20 1245  TempSrc:   PainSc: 6                  Ohm Dentler

## 2020-11-06 NOTE — H&P (Signed)
Heather Mckenzie  Location: [facility] Patient #: [id number] DOB: September 18, 1980 Married / Language: English / Race: White Female   History of Present Illness PM) Patient words: Patient returns for follow-up of her breast cancer.  She has finished chemotherapy and no longer needs her port.  She has done well and has completed reconstruction.   The patient is a 40 year old female.     Problem List/Past Medical 12:16 PM) AT HIGH RISK FOR BREAST CANCER (Z91.89)  BREAST CANCER, LEFT (C50.912)  POST-OPERATIVE STATE [phone])    Past Surgical History  Oral Surgery    Diagnostic Studies History (12:16 PM) Colonoscopy  never Mammogram  within last year Pap Smear  1-5 years ago   Allergies No Known Allergies    Medication History Levothyroxine Sodium  (50MCG Tablet, Oral) Active. Medications Reconciled    Social History Caffeine use  Tea. No alcohol use  No drug use  Tobacco use  Never smoker.   Family History ( Kidney Disease  Father.   Pregnancy / Birth History  Age at menarche  40 years. Contraceptive History  Intrauterine device, Oral contraceptives. Gravida  2 Maternal age  33-30 Para  2 Regular periods    Other Problems (Lump In Breast          Physical Exam   General Mental Status - Alert. General Appearance - Consistent with stated age. Hydration - Well hydrated. Voice - Normal.   Breast Note:  Both breasts are surgically absent. Both reconstructions are intact. Skin is healed well. No masses. Port location below the right clavicle was intact without complicating feature   Neurologic Neurologic evaluation reveals  - alert and oriented x 3 with no impairment of recent or remote memory. Mental Status - Normal.   Lymphatic Head & Neck   General Head & Neck Lymphatics: Bilateral - Description - Normal. Axillary   General Axillary Region: Bilateral - Description - Normal. Tenderness - Non Tender.       Assessment & Plan    BREAST CANCER, LEFT  (C50.912) Impression: Stable follow-up 6 months     PORT-A-CATH IN PLACE [id number]) Impression: plan removal Mister bleeding, infection, catheter migration, catheter fragmentation, deep vein thrombosis cardiovascular events and the need for additional procedures discussed   Current Plans I recommended surgery to remove the catheter.  I explained the technique of removal with use of local anesthesia & possible need for more aggressive sedation/anesthesia for patient comfort.    Risks such as bleeding, infection, and other risks were discussed.  Post-operative dressing/incision care was discussed.  I noted a good likelihood this will help address the problem.   We will work to minimize complications. Questions were answered.  The patient expresses understanding & wishes to proceed with surgery.

## 2020-11-06 NOTE — Op Note (Signed)
Operative Note   DATE OF OPERATION: 11/06/2020  SURGICAL DEPARTMENT: Plastic Surgery  PREOPERATIVE DIAGNOSES: Bilateral mastectomy defects  POSTOPERATIVE DIAGNOSES:  same  PROCEDURE: 1.  Removal of bilateral breast tissue expanders and placement of gel implants 2.  Excision is of excess skin and subcutaneous tissue bilaterally totaling 20 cm on the right and 15 cm on the left with complex closure on each side 3.  Fat grafting to bilateral breast totaling 160 cc on the left and 100 cc on the right.  SURGEON: Talmadge Coventry, MD  ASSISTANT: [place], RNFA The advanced practice practitioner (APP) assisted throughout the case.  The APP was essential in retraction and counter traction when needed to make the case progress smoothly.  This retraction and assistance made it possible to see the tissue plans for the procedure.  The assistance was needed for blood control, tissue re-approximation and assisted with closure of the incision site.  ANESTHESIA:  General.   COMPLICATIONS: None.   INDICATIONS FOR PROCEDURE:  The patient, Heather Mckenzie is a 40 y.o. female born on [birth year], is here for treatment of bilateral mastectomy defect MRN: [id number]  CONSENT:  Informed consent was obtained directly from the patient. Risks, benefits and alternatives were fully discussed. Specific risks including but not limited to bleeding, infection, hematoma, seroma, scarring, pain, contracture, asymmetry, wound healing problems, and need for further surgery were all discussed. The patient did have an ample opportunity to have questions answered to satisfaction.   DESCRIPTION OF PROCEDURE:  The patient was taken to the operating room. SCDs were placed and antibiotics were given.  General anesthesia was administered.  The patient's operative site was prepped and draped in a sterile fashion. A time out was performed and all information was confirmed to be correct.  I started on the left side.  I marked out  the excess tissue laterally that anticipated needing to be removed.  It was infiltrated with a small amount of tumescent solution.  I then excised that with a knife and cautery.  I then dissected down to the lateral aspect of the implant capsule with cautery and opened it.  The fluid was evacuated from the expander and the expander was removed.  A 755 cc sizer was placed and the skin was stapled closed.  I then turned my attention to the right side.  I marked out the anticipated skin that needed to be excised medially and laterally.  Small amount of tumescent solution was infiltrated.  Skin and extra tissue was excised with a knife and cautery and stapled closed.  I then dissected down to the implant capsule laterally and removed the fluid from the expander.  The expander was removed and a 755 cc sizer was placed and the skin was stapled closed.  I then set her up to check for appropriate shape size and symmetry which was the case.  The sizers were then removed and both pockets were irrigated with triple antibiotic solution.  I ended up using a Mentor memory gel extra smooth high profile implant with 755 cc of volume on each side.  Serial number on the left side was [id number].  Serial number on the right side [id number].  Both implants were placed with a Keller funnel.  The ADM was closed to close off the pocket with 3-0 PDS sutures.  Surrounding skin in the areas that were excised was then undermined and advanced and closed in layers with interrupted buried and sorb staples and a running 3 oh V-Loc subcuticular.  At this point I turned my attention to the fat grafting portion of the procedure.  2 L of tumescent solution was infiltrated into the anterior abdominal wall.  This was given time to work and then power assisted liposuction was then performed with a 5 mm cannula into the revolve system.  The fat was washed according to the manufacturer's instructions and processed to prepare it for infiltration.  I then  infiltrated 160 cc on the left side and 100 cc on the right side to smooth out the transition in the upper pole.  This was done with 10 cc syringes in small aliquots.  The small poke holes were closed with interrupted 5-0 fast gut sutures.  Benzoin and Steri-Strips were applied to the incisions.  Dr. [name] also removed her port in the right upper chest and his part will be dictated separately.  An abdominal binder was placed followed by a soft gentle compression dressing for the breasts.  The patient tolerated the procedure well.  There were no complications. The patient was allowed to wake from anesthesia, extubated and taken to the recovery room in satisfactory condition.

## 2020-11-06 NOTE — Brief Op Note (Signed)
11/06/2020  1:16 PM  PATIENT:  Heather Mckenzie  40 y.o. female  PRE-OPERATIVE DIAGNOSIS:  Malignant neoplasm of overlapping sites of left breast in female, estrogen receptor negative  POST-OPERATIVE DIAGNOSIS:  Malignant neoplasm of overlapping sites of left breast in female,   PROCEDURE:  Procedure(s) with comments: Exchange of bilateral breast tissue expanders for gel implants (Bilateral) - 2 hours total Bilateral breast fat grafting (Bilateral) Excision of excess skin bilaterally (Bilateral) REMOVAL PORT-A-CATH (N/A)  SURGEON:  Surgeon(s) and Role: Panel 1:    * [place], Steffanie Dunn, MD - Primary Panel 2:    * [name], MD - Primary  PHYSICIAN ASSISTANT: [place], RNFA  ASSISTANTS: none   ANESTHESIA:   general  EBL:  50 mL   BLOOD ADMINISTERED:none  DRAINS: none   LOCAL MEDICATIONS USED:  MARCAINE     SPECIMEN:  No Specimen  DISPOSITION OF SPECIMEN:  N/A  COUNTS:  YES  TOURNIQUET:  * No tourniquets in log *  DICTATION: .Dragon Dictation  PLAN OF CARE: Discharge to home after PACU  PATIENT DISPOSITION:  PACU - hemodynamically stable.   Delay start of Pharmacological VTE agent (>24hrs) due to surgical blood loss or risk of bleeding: not applicable

## 2020-11-06 NOTE — Op Note (Signed)
Preop diagnosis: Indwelling port a catheter for chemotherapy  Postop diagnosis: Same  Procedure: Removal of port a catheter  Surgeon: [name] M.D.  Anesthesia: general  with local  EBL: Minimal  Specimen none  Drains: None  Indications for procedure: The patient presents for removal of port a catheter after completing chemotherapy.  She is also undergoing implant exchange/ reconstruction with Dr Claudia Desanctis.  The patient no longer requires central venous access. Risks of bleeding, infection, catheter fragmentation, embolization, arrhythmias and damage to arteries, veins and nerves and possibly other mediastinal structures discussed. The patient agrees to proceed.  Description of procedure: The patient was seen in the holding area. Questions were answered. The patient agreed to proceed. The patient was taken to the operating room. The patient was placed supine.  General Anesthesia was initiated. The skin on the upper chest was prepped and draped in a sterile fashion. Timeout was done. The patient received preoperative antibiotics. Incision was made through the old port site and the hub of the Port-A-Cath was seen. The sutures were cut to release the port from the chest wall. The catheter was removed in its entirety without difficulty. The tract was closed with 3-0 Vicryl. 4 Monocryl was used to close the skin. All final counts were correct. The patient was taken to recovery in satisfactory condition. Dr Claudia Desanctis took over at this point of the case.

## 2020-11-06 NOTE — Discharge Instructions (Addendum)
Activity: As tolerated, but avoid strenuous activity until follow up visit.  Diet: Regular  Wound Care: Keep dressing clean & dry for 2 days.  After that you can shower normally.  Redress the wound as needed for comfort.  Special Instructions:  Call our office if any unusual problems occur such as pain, excessive bleeding, unrelieved nausea/vomiting, fever &/or chills.  Follow-up appointment: Scheduled for next week.  Post Anesthesia Home Care Instructions  Activity: Get plenty of rest for the remainder of the day. A responsible individual must stay with you for 24 hours following the procedure.  For the next 24 hours, DO NOT: -Drive a car -Paediatric nurse -Drink alcoholic beverages -Take any medication unless instructed by your physician -Make any legal decisions or sign important papers.  Meals: Start with liquid foods such as gelatin or soup. Progress to regular foods as tolerated. Avoid greasy, spicy, heavy foods. If nausea and/or vomiting occur, drink only clear liquids until the nausea and/or vomiting subsides. Call your physician if vomiting continues.  Special Instructions/Symptoms: Your throat may feel dry or sore from the anesthesia or the breathing tube placed in your throat during surgery. If this causes discomfort, gargle with warm salt water. The discomfort should disappear within 24 hours.  If you had a scopolamine patch placed behind your ear for the management of post- operative nausea and/or vomiting:  1. The medication in the patch is effective for 72 hours, after which it should be removed.  Wrap patch in a tissue and discard in the trash. Wash hands thoroughly with soap and water. 2. You may remove the patch earlier than 72 hours if you experience unpleasant side effects which may include dry mouth, dizziness or visual disturbances. 3. Avoid touching the patch. Wash your hands with soap and water after contact with the patch.

## 2020-11-07 ENCOUNTER — Encounter (HOSPITAL_BASED_OUTPATIENT_CLINIC_OR_DEPARTMENT_OTHER): Payer: Self-pay | Admitting: Plastic Surgery

## 2020-11-14 ENCOUNTER — Other Ambulatory Visit: Payer: Self-pay

## 2020-11-14 ENCOUNTER — Ambulatory Visit (INDEPENDENT_AMBULATORY_CARE_PROVIDER_SITE_OTHER): Payer: 59 | Admitting: Plastic Surgery

## 2020-11-14 DIAGNOSIS — Z171 Estrogen receptor negative status [ER-]: Secondary | ICD-10-CM

## 2020-11-14 DIAGNOSIS — C50812 Malignant neoplasm of overlapping sites of left female breast: Secondary | ICD-10-CM

## 2020-11-14 NOTE — Progress Notes (Signed)
Patient is here postop from exchange of breast tissue expanders for implants along with fat grafting.  She is overall very happy.  On exam everything looks to be healing fine.  There is a little bit of bruising in both locations but nothing out of the ordinary.  Overall her shape and contour looks very good.  We will plan to continue avoiding strenuous activity and I will see her again in a few weeks.

## 2020-11-30 ENCOUNTER — Other Ambulatory Visit: Payer: Self-pay

## 2020-11-30 ENCOUNTER — Encounter: Payer: Self-pay | Admitting: Adult Health

## 2020-11-30 ENCOUNTER — Inpatient Hospital Stay: Payer: 59 | Attending: Hematology and Oncology | Admitting: Adult Health

## 2020-11-30 VITALS — BP 131/87 | HR 82 | Temp 97.5°F | Resp 18 | Ht 63.0 in | Wt 225.8 lb

## 2020-11-30 DIAGNOSIS — Z171 Estrogen receptor negative status [ER-]: Secondary | ICD-10-CM | POA: Diagnosis not present

## 2020-11-30 DIAGNOSIS — C50812 Malignant neoplasm of overlapping sites of left female breast: Secondary | ICD-10-CM | POA: Diagnosis not present

## 2020-11-30 DIAGNOSIS — Z853 Personal history of malignant neoplasm of breast: Secondary | ICD-10-CM | POA: Insufficient documentation
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
## 2020-12-03 ENCOUNTER — Telehealth: Payer: Self-pay | Admitting: Adult Health

## 2020-12-03 NOTE — Telephone Encounter (Signed)
Scheduled appt per 8/19 los. Pt aware.

## 2020-12-06 ENCOUNTER — Ambulatory Visit (INDEPENDENT_AMBULATORY_CARE_PROVIDER_SITE_OTHER): Payer: 59

## 2020-12-06 ENCOUNTER — Other Ambulatory Visit: Payer: Self-pay

## 2020-12-06 ENCOUNTER — Encounter: Payer: 59 | Admitting: Adult Health

## 2020-12-06 VITALS — BP 131/87 | Temp 98.3°F | Ht 63.0 in | Wt 225.8 lb

## 2020-12-06 DIAGNOSIS — Z171 Estrogen receptor negative status [ER-]: Secondary | ICD-10-CM

## 2020-12-06 DIAGNOSIS — C50812 Malignant neoplasm of overlapping sites of left female breast: Secondary | ICD-10-CM

## 2020-12-06 DIAGNOSIS — Z9013 Acquired absence of bilateral breasts and nipples: Secondary | ICD-10-CM

## 2020-12-08 NOTE — Progress Notes (Signed)
Patient in office today for postop-f/u: She had tissue expander removal & replace- gel implants Fat grafting - bilateral breast & excision of excess skin bilaterally by Dr. Claudia Desanctis on 11/06/20 She c/o minimal pain - bilateral breast/lateral aspect & abdomen she denies any fever/chills- no n/v . She is eating/drinking & normal bowel/bladder function. Incisions are all intact & there is no s/s of any infectious process. I removed sutures from left medial aspect of incision & the abdominal liposuction sites without any complications. She is wearing sports bra. She does have a small area firmness- left breast upper area at the fat grafted site.-  I instructed her to apply firm massage/manipulation -as tolerated to soften this area. She understands to call for any changes or concerns.

## 2020-12-08 NOTE — Patient Instructions (Signed)
Patient will call for any changes or concerns 

## 2021-01-17 ENCOUNTER — Ambulatory Visit: Payer: 59 | Admitting: Surgical

## 2021-01-21 ENCOUNTER — Other Ambulatory Visit: Payer: Self-pay | Admitting: Hematology and Oncology

## 2021-01-21 ENCOUNTER — Ambulatory Visit: Payer: 59 | Attending: Surgery

## 2021-01-21 ENCOUNTER — Other Ambulatory Visit: Payer: Self-pay

## 2021-01-21 VITALS — Wt 231.2 lb

## 2021-01-21 DIAGNOSIS — E039 Hypothyroidism, unspecified: Secondary | ICD-10-CM

## 2021-01-21 DIAGNOSIS — Z171 Estrogen receptor negative status [ER-]: Secondary | ICD-10-CM

## 2021-01-21 DIAGNOSIS — Z483 Aftercare following surgery for neoplasm: Secondary | ICD-10-CM

## 2021-01-21 DIAGNOSIS — C50812 Malignant neoplasm of overlapping sites of left female breast: Secondary | ICD-10-CM
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
## 2021-01-22 ENCOUNTER — Other Ambulatory Visit: Payer: Self-pay | Admitting: *Deleted

## 2021-01-22 DIAGNOSIS — C50812 Malignant neoplasm of overlapping sites of left female breast: Secondary | ICD-10-CM

## 2021-01-22 DIAGNOSIS — Z171 Estrogen receptor negative status [ER-]: Secondary | ICD-10-CM

## 2021-01-23 ENCOUNTER — Other Ambulatory Visit: Payer: Self-pay

## 2021-01-23 ENCOUNTER — Encounter: Payer: Self-pay | Admitting: Surgical

## 2021-01-23 ENCOUNTER — Ambulatory Visit (INDEPENDENT_AMBULATORY_CARE_PROVIDER_SITE_OTHER): Payer: 59 | Admitting: Surgical

## 2021-01-23 DIAGNOSIS — C50812 Malignant neoplasm of overlapping sites of left female breast: Secondary | ICD-10-CM

## 2021-01-23 DIAGNOSIS — Z171 Estrogen receptor negative status [ER-]: Secondary | ICD-10-CM

## 2021-01-23 DIAGNOSIS — Z9013 Acquired absence of bilateral breasts and nipples: Secondary | ICD-10-CM

## 2021-01-23 NOTE — Progress Notes (Signed)
Patient is a 40 year old female here for follow-up after exchange of bilateral breast tissue expanders for bilateral gel breast implants, bilateral breast fat grafting and excision of excess bilateral breast tissue/skin with Dr. Claudia Desanctis on 11/06/2020.  Patient reports overall she is doing well, she does report that she has been massaging the left breast area where she has had some firmness.  She has not noticed any increase in size, but has not improved with massage/manipulation.  She is overall very pleased with her reconstruction.  She is interested in nipple areola tattooing.  Chaperone present on exam On exam bilateral breast incisions are intact and healing very well.  She has overall very good symmetry.  She does have a little bit of volume loss superiorly of the left breast and the right breast but overall has good shape.  She has an area of firmness of the left breast in the location of fat injection that is approximately 5 x 3 cm.  It does not have any overlying skin changes.  It is not sensitive to palpation.  40 year old female status post bilateral breast reconstruction for breast cancer.  She does have an area of firmness and a palpable lump that is approximately 5 x 3 cm in the left breast just superior to the implant.  There is no overlying skin changes.  Suspect likely some fat necrosis from recent fat grafting on 11/06/2020.  She reports that this has been present since her last visit.  She is unsure if it is unchanged or not.  Given the surgical history and after thorough discussion with patient, plan to continue with massage and will reevaluate at her next visit in 1 month.  I did discuss with the patient that if she notices any changes or increase in size and the area to please let me know and we would discuss imaging.  Patient was in agreement with this plan.  Recommend calling with questions or concerns.

## 2021-02-27 ENCOUNTER — Encounter: Payer: Self-pay | Admitting: Surgical

## 2021-02-27 ENCOUNTER — Other Ambulatory Visit: Payer: Self-pay

## 2021-02-27 ENCOUNTER — Ambulatory Visit (INDEPENDENT_AMBULATORY_CARE_PROVIDER_SITE_OTHER): Payer: 59 | Admitting: Surgical

## 2021-02-27 DIAGNOSIS — Z171 Estrogen receptor negative status [ER-]: Secondary | ICD-10-CM

## 2021-02-27 DIAGNOSIS — C50812 Malignant neoplasm of overlapping sites of left female breast: Secondary | ICD-10-CM

## 2021-02-27 DIAGNOSIS — Z9013 Acquired absence of bilateral breasts and nipples: Secondary | ICD-10-CM

## 2021-02-27 NOTE — Progress Notes (Signed)
   Referring Provider No referring provider defined for this encounter.   CC:  Chief Complaint  Patient presents with   Follow-up      Heather Mckenzie is an 40 y.o. female.  HPI: 40 year old female here for follow-up after exchange of bilateral breast tissue expanders for bilateral gel implants, bilateral breast fat grafting and excision of excess axillary bilateral breast tissue with Dr. Claudia Desanctis on 11/06/2020.  She reports overall she is doing well, she still notices the area of fat necrosis along the left superior lateral breast and the left medial breast.  She reports that she has been massaging this.  She does not report any increase in size, reports no pain to this area.  She does have a history of radiation to the left breast.  Review of Systems General: No fevers or chills  Physical Exam Vitals with BMI 01/21/2021 12/12/2020 11/30/2020  Height - 5\' 3"  5\' 3"   Weight 231 lbs 4 oz 225 lbs 13 oz 225 lbs 13 oz  BMI - 96.22 29.79  Systolic - 892 119  Diastolic - 87 87  Pulse - - 82    General:  No acute distress,  Alert and oriented, Non-Toxic, Normal speech and affect Breast: Bilateral mastectomy incisions are healing well, she has good symmetry.  There is no erythema or cellulitic changes noted.  There is no overlying skin changes.  She does have 2 areas of fat necrosis noted in the left breast, 1 in the superior lateral aspect that is approximately 2 x 2 cm.  This is minimally tender.  She also has a small area of fat necrosis along the medial left breast that is approximately 1 x 1 cm.  No overlying skin changes in either area.  Assessment/Plan 40 year old female status post bilateral breast reconstruction with history of radiation the left breast.  We have been watching 2 areas of fat necrosis of the left breast, no changes since her last visit per patient.  She reports they have not changed in size.  She is not having any tenderness in this area and was palpated.  She is going to  continue massaging these areas and continue to monitor for improvement.  Given that she has no pain, will continue to monitor as with time the fat necrosis should improve.  She is interested in bilateral nipple areolar tattooing.  We discussed this briefly today.  She is interested in having this done prior to the end of the year.  We will set this up.  Heather Mckenzie 02/27/2021, 9:36 AM

## 2021-03-14 ENCOUNTER — Ambulatory Visit: Payer: 59 | Admitting: Physician Assistant

## 2021-03-25 IMAGING — CT CT CHEST W/ CM
2 of 4 series · 12 of 36 positions shown, 15 images · IV contrast (omnipaque)
Comparison: None aside from bone scan acquired on 05/09/2019

CLINICAL DATA: Recent diagnosis of node positive left breast
cancer.

EXAM:
CT CHEST, ABDOMEN, AND PELVIS WITH CONTRAST
TECHNIQUE: Multidetector CT imaging of the chest, abdomen and pelvis was
performed following the standard protocol during bolus
administration of intravenous contrast.
CONTRAST:  100mL OMNIPAQUE IOHEXOL 300 MG/ML  SOLN

[Series 2: cap with · axial · 0.85mm/px · z∈[+1187,+1702]mm · 9 of 123 slices shown, 12 images]
[im 10/123  mediastinal]
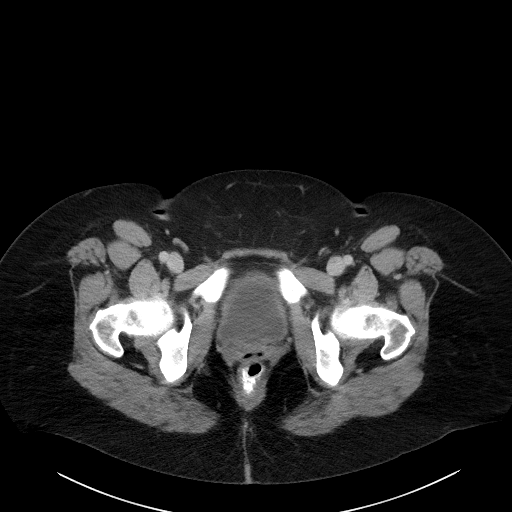
[im 10/123  lung]
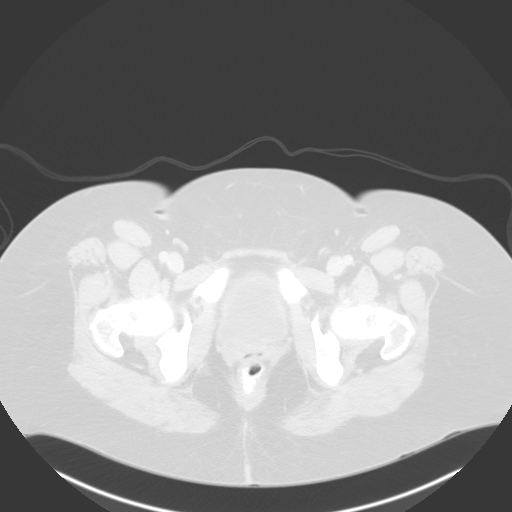
[im 29/123  lung]
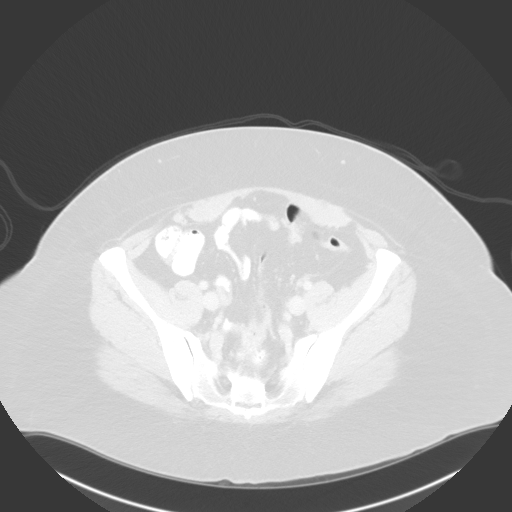
[im 38/123  lung]
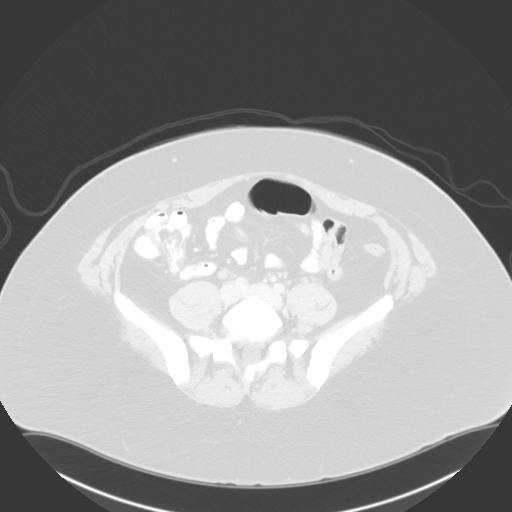
[im 47/123  lung]
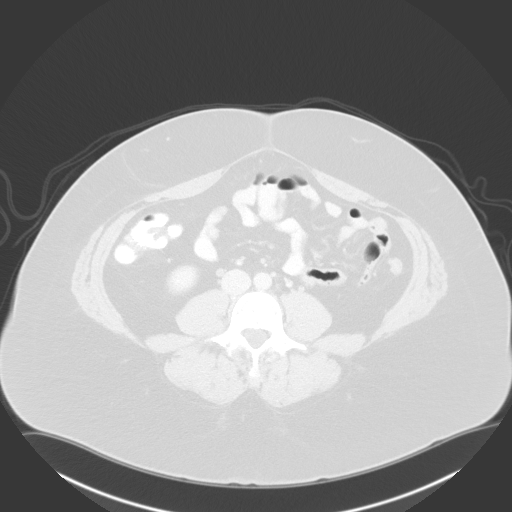
[im 66/123  mediastinal]
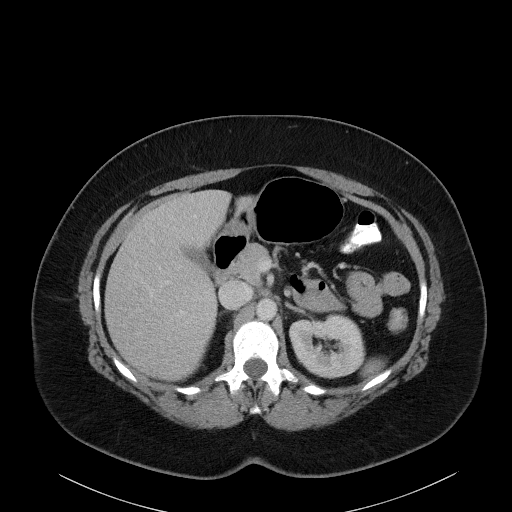
[im 66/123  lung]
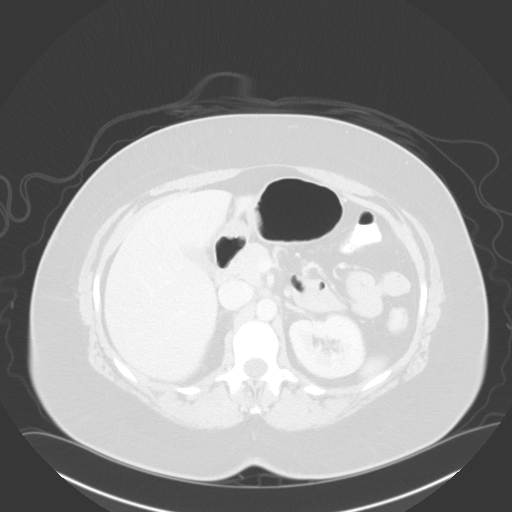
[im 76/123  lung]
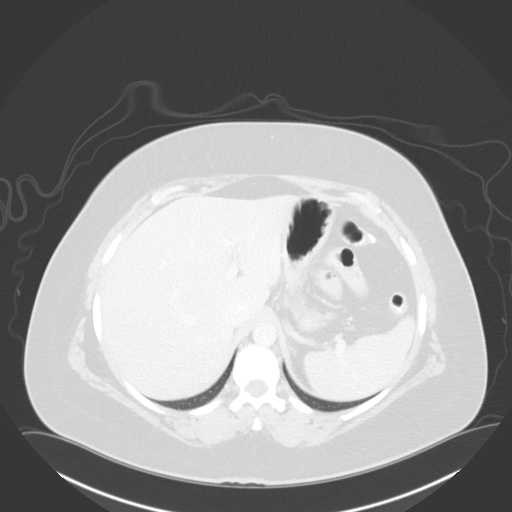
[im 85/123  lung]
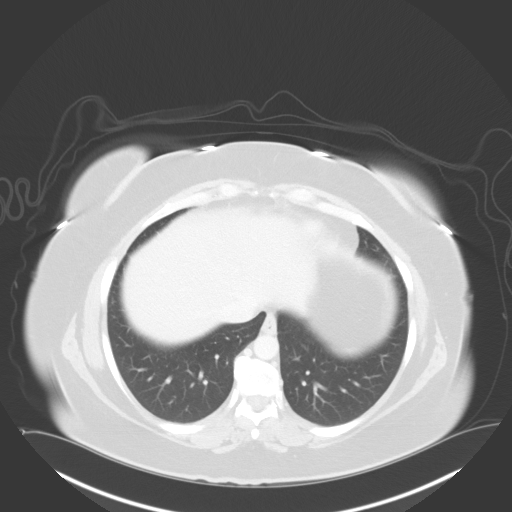
[im 104/123  lung]
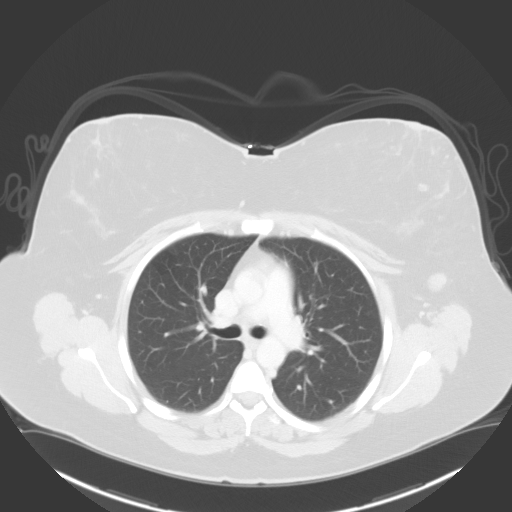
[im 113/123  mediastinal]
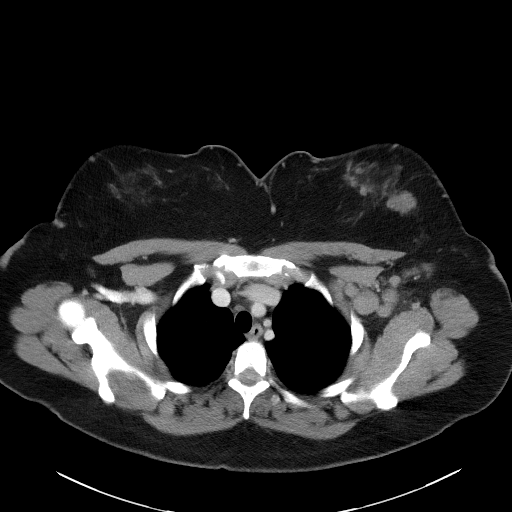
[im 113/123  lung]
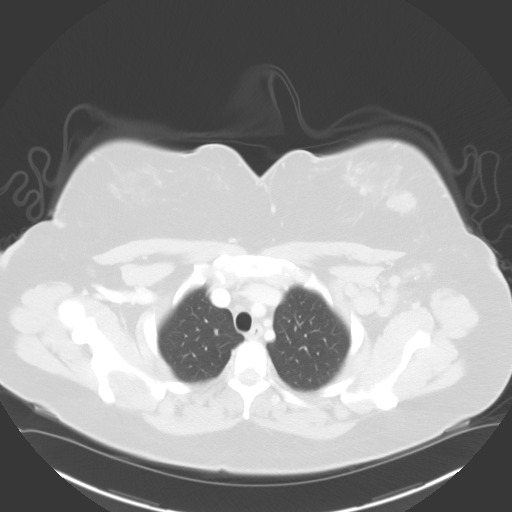

[Series 5: coronals · coronal · 0.80mm/px · 3 of 166 slices shown]
[im 34/166  lung]
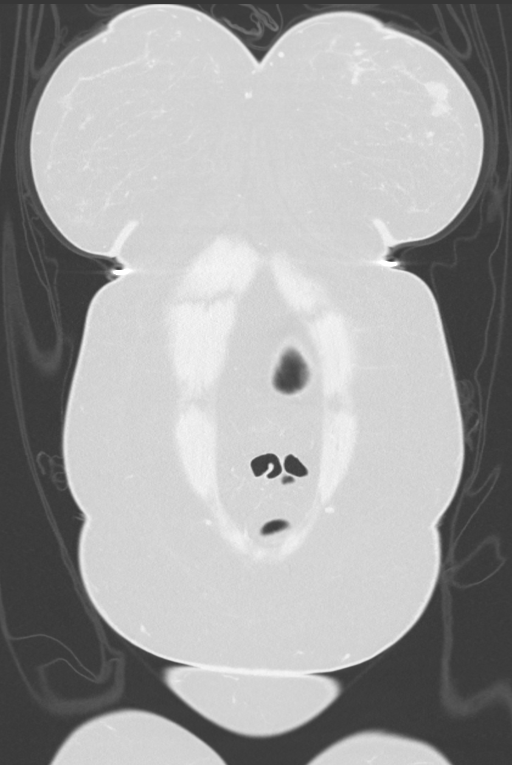
[im 67/166  lung]
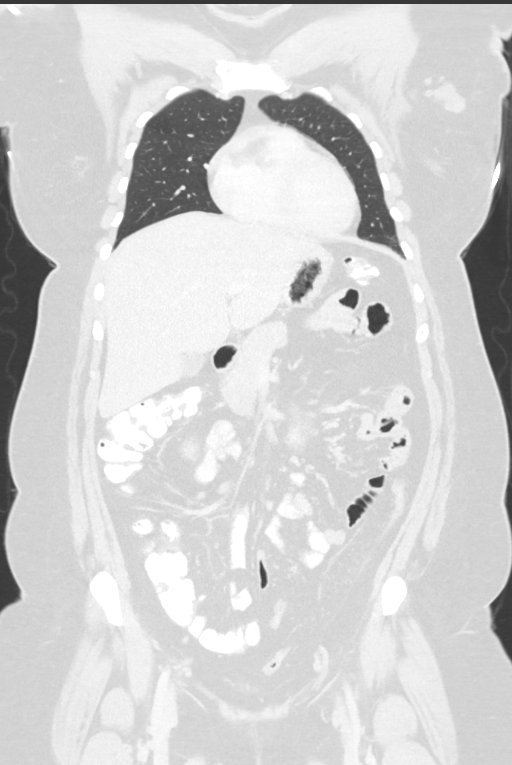
[im 100/166  lung]
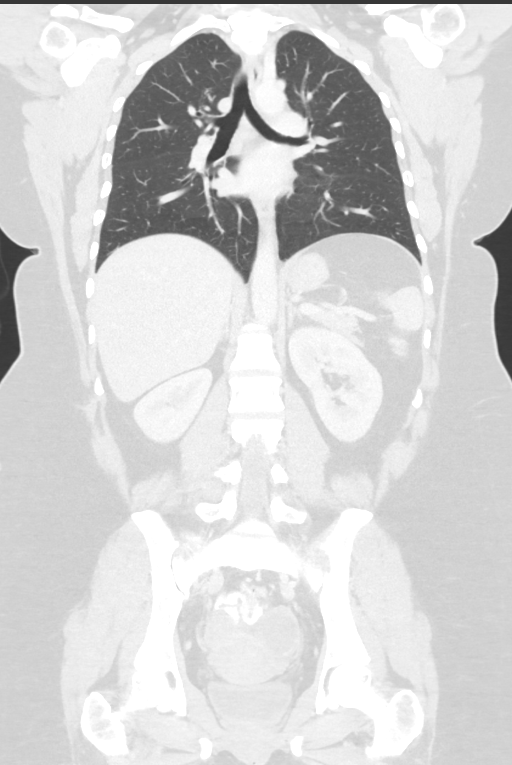

[12 of 36 positions shown; findings below may reference images not displayed]

FINDINGS: CT CHEST FINDINGS

Cardiovascular: No signs of aortic dilation or significant
atherosclerosis. Heart size is normal without signs of pericardial
effusion. Central pulmonary vasculature is unremarkable.

Mediastinum/Nodes: No signs of adenopathy at the thoracic inlet,
mediastinum or in the hilar regions.

No right axillary adenopathy.

Bulky left axillary adenopathy previously biopsied, largest lymph
node measuring 4.4 x 3.0 cm (image 16, series 2) masses and soft
tissue nodularity throughout the left breast, biopsy marker within a
lesion in the upper left breast which measures approximately 3.7 x
2.0 cm (image 14, series 2)

High left axillary adenopathy just outside of the margin of the
pectoralis musculature, largest at 2 cm short axis (image 14, series
2

Subpectoral adenopathy beneath the pectoralis minor, 2.1 cm short
axis (image 11, series 2)

Adenopathy tracking deep to the pectoralis musculature along the
medial margin of the pectoralis minor largest approximately 1 cm
(image 9, largest is only 4 mm but is in close proximity to the
abnormal lymph nodes in the high left axilla and subpectoral nodal
chains.

No signs of internal mammary adenopathy.

Lungs/Pleura: Lungs are clear. No signs of pleural effusion.

Musculoskeletal: Signs of left breast, axillary and subpectoral
nodal involvement. No additional signs of chest wall involvement.

CT ABDOMEN PELVIS FINDINGS

Hepatobiliary: Subtle hypodensity near the fissure for false form
ligament likely ?pseudo lesion? but with slightly rounded borders
which is somewhat atypical. This measures 1.8 x 1.5 cm (image 54,
series series 2)

Lobulated hepatic contours. No signs of biliary ductal dilation.

Pancreas: Pancreas is normal without signs of pancreatic ductal
distension or focal lesion. No signs of peripancreatic inflammation.

Spleen: Spleen is normal size without focal lesion.

Adrenals/Urinary Tract: Adrenal glands are normal size and contour.
The

Kidneys enhance symmetrically without signs of focal lesion.

Stomach/Bowel: No signs of acute gastrointestinal process. Bowel is
normal in caliber. The appendix is normal.

Vascular/Lymphatic: Vascular structures in the abdomen are patent.
There is no sign of abdominal adenopathy.

No sign of pelvic lymphadenopathy.

Reproductive: Cystic lesion in the left adnexa measuring 4.2 x
cm with suggestion of mural nodularity along the lateral wall (image
103, series 2) this measures approximately 6 mm in greatest
thickness. No right adnexal mass. Uterus unremarkable.

Other: No sign of free air or ascites. Laxity of the ventral
abdominal wall and small fat containing umbilical hernia.

Musculoskeletal: No sign of discrete lesion corresponding to the
sternal abnormality that was suggested on bone scan. There is
degenerative change in sternal manubrial junction.

Asymmetry of rib activity to attenuation secondary to overlying soft
tissue/breast tissue.
IMPRESSION: 1. Signs of left breast, axillary and subpectoral/infraclavicular
nodal involvement.
2. Cystic lesion in the left adnexa measuring 4.2 x 3.2 cm with
suggestion of mural nodularity along the lateral wall. Pelvic
sonogram may be helpful to exclude ovarian neoplasm.
3. Subtle hypodensity near the fissure for false form ligament
likely represents a pseudo lesion but with slightly rounded borders
which is somewhat atypical.
4. No sign of discrete sternal lesion that was suggested on bone
scan. PET scan may be helpful given subtle activity in this area,
this could be due to degenerative changes at the costochondral
junction but this is un clear. PET scan could also be helpful in
excluding the possibility of metastasis in the liver where an area
that is likely a pseudo lesion given location and density, shows
some mildly atypical features in the setting of breast cancer.

## 2021-03-26 ENCOUNTER — Other Ambulatory Visit: Payer: Self-pay

## 2021-03-26 ENCOUNTER — Ambulatory Visit (INDEPENDENT_AMBULATORY_CARE_PROVIDER_SITE_OTHER): Payer: 59 | Admitting: Physician Assistant

## 2021-03-26 DIAGNOSIS — Z9013 Acquired absence of bilateral breasts and nipples: Secondary | ICD-10-CM | POA: Diagnosis not present

## 2021-03-26 NOTE — Progress Notes (Signed)
NIPPLE AREOLAR TATTOO PROCEDURE  PREOPERATIVE DIAGNOSIS:  Acquired absence of bilateral nipple areolar   POSTOPERATIVE DIAGNOSIS: Acquired absence of bilateral nipple areolar    PROCEDURES: Bilateral nipple areolar tattoo   ANESTHESIA:  EMLA, 4% Lidocaine with epinephrine  COMPLICATIONS: None.  JUSTIFICATION FOR PROCEDURE:  [name] is a 40 y.o. female with a history of breast cancer status post breast reconstruction. The patient presents for nipple areolar complex tattoo. Risks, benefits, indications, and alternatives of the above described procedures were discussed with the patient and all the patient's questions were answered.   Patient was accompanied by her husband, Quillian Quince, who is a Theme park manager in [place].  She reports that she has had radiation to her left breast.  Her preoperative exam is quite reassuring, she is an excellent candidate for bilateral nipple areolar tattoo procedure.  Reconstructed breasts have good shape and symmetry.  Very mild postradiation erythema, if any, to left breast.  DESCRIPTION OF PROCEDURE: After written informed consent was obtained and proper identification of patient and surgical site was made, the patient was taken to the procedure room and placed supine on the operating room table. A time out was performed to confirm patient's identity and surgical site. The patient was prepped and draped in the usual sterile fashion. Alcohol swabs were used. Attention was turned to the selection of flesh colored permanent tattoo ink to produce the appropriate color for nipple areolar complex tattooing. Using a digital revo 7R tattoo head, pointillism pigment was instilled to the designed nipple areolar complex which was confirmed preoperatively with the patient using the Digital-Pop Deluxe machine by [employer]. Spiral technique was used to fill out the areola.  Manual approach using SofTap #36 and SofTap #18 and hand tapping tools was also utilized.  Once adequate  pigment had been applied to the nipple areolar complex, Vaseline and dressing was applied. There were no complications and the procedure was tolerated without difficulty.    Areola-color: Dell [place], Fair Peach, Tan Mink in 4:4:2 drop ratio respectively.   Additional: Same colors but in 4:4:6 ratio plus a single drop of cool mink for nipple shading.  Colors EXP: 04/2024  Picture(s) obtained of the patient and placed in the chart were with the patient's or guardian's permission. Left areola appears to be much lighter than right side despite same color palate and # of tattoo passes performed.  May be related to patient's radiation.  Will see at patient's follow-up in 6-8 weeks.

## 2021-05-06 ENCOUNTER — Ambulatory Visit: Payer: 59 | Attending: Surgery

## 2021-05-06 DIAGNOSIS — Z483 Aftercare following surgery for neoplasm: Secondary | ICD-10-CM | POA: Insufficient documentation

## 2021-05-06 NOTE — Progress Notes (Deleted)
NIPPLE AREOLAR TATTOO PROCEDURE  PREOPERATIVE DIAGNOSIS:  Acquired absence of bilateral nipple areolar   POSTOPERATIVE DIAGNOSIS: Acquired absence of bilateral nipple areolar    PROCEDURES: Bilateral nipple areolar tattoo   ANESTHESIA:  EMLA, 4% Lidocaine with epinephrine  COMPLICATIONS: None.  JUSTIFICATION FOR PROCEDURE:  Heather Mckenzie is a 41 y.o. female with a history of breast cancer status post breast reconstruction. The patient presents for nipple areolar complex tattoo. Risks, benefits, indications, and alternatives of the above described procedures were discussed with the patient and all the patient's questions were answered.    Today ***   DESCRIPTION OF PROCEDURE: After written informed consent was obtained and proper identification of patient and surgical site was made, the patient was taken to the procedure room and placed supine on the operating room table. A time out was performed to confirm patient's identity and surgical site. The patient was prepped and draped in the usual sterile fashion. Alcohol swabs were used. Attention was turned to the selection of flesh colored permanent tattoo ink to produce the appropriate color for nipple areolar complex tattooing. Using a digital revo 7R tattoo head, pointillism pigment was instilled to the designed nipple areolar complex which was confirmed preoperatively with the patient using the Digital-Pop Deluxe machine by [employer]. Spiral technique was used to fill out the areola.  Manual approach using SofTap #36 and SofTap #18 and hand tapping tools was also utilized.  Once adequate pigment had been applied to the nipple areolar complex, Vaseline and dressing was applied. There were no complications and the procedure was tolerated without difficulty.    Areola-color: Dell [place], Fair Peach, Tan Mink in 4:4:2 drop ratio respectively.    Additional: Same colors but in 4:4:6 ratio plus a single drop of cool mink for nipple shading.    Colors EXP: 04/2024

## 2021-05-09 ENCOUNTER — Ambulatory Visit: Payer: 59 | Admitting: Physician Assistant

## 2021-06-01 NOTE — Progress Notes (Signed)
Patient Care Team: Pcp, No as PCP - General [name], MD as Consulting Physician (General Surgery) [name], MD as Consulting Physician (Hematology and Oncology) [name], MD as Consulting Physician (Radiation Oncology) [name], MD as Consulting Physician (Plastic Surgery) Madelin Rear, MD as Consulting Physician (Endocrinology) Earnstine Regal, PA-C as Physician Assistant (Obstetrics and Gynecology)  DIAGNOSIS:    ICD-10-CM   1. Malignant neoplasm of overlapping sites of left breast in female, estrogen receptor negative ([place])  C50.812    Z17.1       SUMMARY OF ONCOLOGIC HISTORY: Oncology History  Malignant neoplasm of overlapping sites of left breast in female, estrogen receptor negative ([place])  04/25/2019 Initial Diagnosis   Patient palpated a left breast and axillary mass x28month. [place] left breast showed a 3.6cm mass at the 1 o'clock position with 6 smaller adjacent masses extending toward the nipple ranging in size from 1.1cm to 4.0cm. [place] the left axilla showed five bulky lymph nodes, the largest measuring 4.2cm, and second largest measuring 2.8cm. 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    04/27/2019 Cancer Staging   Staging form: Breast, AJCC 8th Edition - Clinical: Stage IIIC (cT1c, cN2a, cM0, G3, ER-, PR-, HER2-) - Signed by GNicholas Lose MD on 04/27/2019     Genetic Testing   Negative genetic testing. No pathogenic variants identified. VUS in HOXB13 called c.473C>A, VUS in POLD1 called c.1610G>C, and VUS in RAD50 called c.1094G>A identified on the [facility] Common Hereditary Cancers Panel. The report date is 05/10/2019.  The Common Hereditary Cancers Panel offered by [facility] includes sequencing and/or deletion duplication testing of the following 48 genes: APC, ATM, AXIN2, BARD1, BMPR1A, BRCA1, BRCA2, BRIP1, CDH1, CDKN2A (p14ARF), CDKN2A (p16INK4a), CKD4, CHEK2, CTNNA1, DICER1, EPCAM  (Deletion/duplication testing only), GREM1 (promoter region deletion/duplication testing only), KIT, MEN1, MLH1, MSH2, MSH3, MSH6, MUTYH, NBN, NF1, NHTL1, PALB2, PDGFRA, PMS2, POLD1, POLE, PTEN, RAD50, RAD51C, RAD51D, RNF43, SDHB, SDHC, SDHD, SMAD4, SMARCA4. STK11, TP53, TSC1, TSC2, and VHL.  The following genes were evaluated for sequence changes only: SDHA and HOXB13 c.251G>A variant only.   05/12/2019 - 10/27/2019 Neo-Adjuvant Chemotherapy   Keytruda/Adriamycin/Cytoxan q3 wks x 4 cycles 05/12/2019-07/14/2019 Keytruda d1/Carbo d1/Taxol d1,d8,d15 q21 days x 4 cycles 08/04/2019-10/27/19   01/10/2020 Surgery   Bilateral mastectomies with reconstruction ([facility]): Right breast: no evidence tof malignancy Left breast: no evidence of residual carcinoma and 4 benign lymph nodes.    01/10/2020 Cancer Staging   Staging form: Breast, AJCC 8th Edition - Pathologic stage from 01/10/2020: No Stage Recommended (ypT0, pN0, cM0) - Signed by CGardenia Phlegm NP on 11/20/2020 Stage prefix: Post-therapy    02/10/2020 - 07/26/2020 Adjuvant Chemotherapy   Keytruda every 3 weeks   02/27/2020 - 04/18/2020 Radiation Therapy   Adjuvant radiation ([place]) Left breast / 3D. A total of 50.4 Gy was given in 1.8 Gy per fraction for 28 fractions. 10X, 15X Left breast SCLV / 3D. A total of 50.4 Gy was given in 1.8 Gy per fraction for 28 fractions. 6X, 10X Left breast boost / 3D. A total of 10 Gy was given in 2 Gy per fraction for 5 fractions. 6E      CHIEF COMPLIANT: Follow-up of triple negative breast cancer  INTERVAL HISTORY: Heather TALBOTis a 41y.o. with above-mentioned history of triple negative breast cancer who underwent neoadjuvant chemotherapy and had a complete pathologic response and is currently on maintenance therapy  with pembrolizumab. She presents to the clinic today for follow-up.   ALLERGIES:  has No Known Allergies.  MEDICATIONS:  Current Outpatient Medications  Medication Sig Dispense  Refill   levothyroxine (SYNTHROID) 88 MCG tablet TAKE 1 TABLET(88 MCG) BY MOUTH DAILY BEFORE BREAKFAST 30 tablet 3   paragard intrauterine copper IUD IUD ParaGard T 380A 380 square mm intrauterine device  1 Paragard IUD device placed within uterine cavity; Device taken from [employer]     No current facility-administered medications for this visit.    PHYSICAL EXAMINATION: ECOG PERFORMANCE STATUS: 1 - Symptomatic but completely ambulatory  Vitals:   06/03/21 1504  BP: 111/68  Pulse: 82  Resp: 17  Temp: (!) 97.5 F (36.4 C)  SpO2: 99%   Filed Weights   06/03/21 1504  Weight: 231 lb 12.8 oz (105.1 kg)    BREAST: No palpable masses or nodules in either right or left breasts. No palpable axillary supraclavicular or infraclavicular adenopathy no breast tenderness or nipple discharge. (exam performed in the presence of a chaperone)  LABORATORY DATA:  I have reviewed the data as listed CMP Latest Ref Rng & Units 11/05/2020 07/26/2020 07/05/2020  Glucose 70 - 99 mg/dL 94 95 94  BUN 6 - 20 mg/dL _0 Creatinine 0.44 - 1.00 mg/dL 0.92 1.00 1.15(H)  Sodium 135 - 145 mmol/L 138 141 140  Potassium 3.5 - 5.1 mmol/L 4.0 4.0 4.1  Chloride 98 - 111 mmol/L 102 103 102  CO2 22 - 32 mmol/L _1 Calcium 8.9 - 10.3 mg/dL 9.3 9.5 9.2  Total Protein 6.5 - 8.1 g/dL 7.8 7.5 7.6  Total Bilirubin 0.3 - 1.2 mg/dL 0.8 0.3 0.4  Alkaline Phos 38 - 126 U/L 83 107 93  AST 15 - 41 U/L 40 24 39  ALT 0 - 44 U/L 39 18 25    Lab Results  Component Value Date   WBC 6.3 06/03/2021   HGB 12.1 06/03/2021   HCT 36.7 06/03/2021   MCV 91.8 06/03/2021   PLT 294 06/03/2021   NEUTROABS 3.6 06/03/2021    ASSESSMENT & PLAN:  Malignant neoplasm of overlapping sites of left breast in female, estrogen receptor negative ([place]) 04/25/2019:Patient palpated a left breast and axillary mass x56month. [place] the left breast showed a 3.6cm mass at the 1 o'clock position with 6 smaller adjacent masses extending  toward the nipple ranging in size from 1.1cm to 4.0cm. [place] the left axilla showed five bulky lymph nodes, the largest measuring 4.2cm, and second largest measuring 2.8cm. 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  T1c N2 stage IIIc Based on PET CT scan showing bilateral cervical nodes, it is stage IV (however her goal is to cure given the oligometastatic nature of her cancer.)   01/10/2020: Bilateral mastectomies with reconstruction ([facility]): Right breast: no evidence of malignancy Left breast: no evidence of residual carcinoma and 4 benign lymph nodes.  ---------------------------------------------------------------------------------------------------------------------------------------------------------------------------  Treatment summary:  1.  Adjuvant radiation completed 04/18/2020 2. adjuvant Pembrolizumab maintenance therapy completed 07/26/2020   Immune mediated adverse effects: Hypothyroidism, on Synthroid, under the care of endocrinology.  Breast cancer surveillance: 1.  No role of imaging studies since she had bilateral mastectomies 2. breast exam: Benign  CT CAP 08/10/2020: Subtle areas of hyperenhancement in the liver parenchyma probably benign (fatty liver) cannot rule out other causes and this suggested an MRI of the abdomen  Nodularity in  the left breast: We will obtain an ultrasound to evaluate.  Suspect fat necrosis.  Abdominal pain and back pain: I will obtain CT chest abdomen pelvis for further evaluation.   Return to clinic in 1 year for follow-up  No orders of the defined types were placed in this encounter.  The patient has a good understanding of the overall plan. she agrees with it. she will call with any problems that may develop before the next visit here.  Total time spent: 20 mins including face to face time and time spent for planning, charting and coordination of care  Rulon Eisenmenger, MD,  MPH 06/03/2021  I, Thana Ates, am acting as scribe for Dr. [name].  I have reviewed the above documentation for accuracy and completeness, and I agree with the above.

## 2021-06-03 ENCOUNTER — Inpatient Hospital Stay: Payer: 59 | Attending: Hematology and Oncology | Admitting: Hematology and Oncology

## 2021-06-03 ENCOUNTER — Inpatient Hospital Stay: Payer: 59

## 2021-06-03 ENCOUNTER — Other Ambulatory Visit: Payer: Self-pay

## 2021-06-03 DIAGNOSIS — Z9013 Acquired absence of bilateral breasts and nipples: Secondary | ICD-10-CM | POA: Insufficient documentation

## 2021-06-03 DIAGNOSIS — Z79890 Hormone replacement therapy (postmenopausal): Secondary | ICD-10-CM | POA: Diagnosis not present

## 2021-06-03 DIAGNOSIS — R109 Unspecified abdominal pain: Secondary | ICD-10-CM | POA: Insufficient documentation

## 2021-06-03 DIAGNOSIS — Z9221 Personal history of antineoplastic chemotherapy: Secondary | ICD-10-CM | POA: Insufficient documentation

## 2021-06-03 DIAGNOSIS — Z171 Estrogen receptor negative status [ER-]: Secondary | ICD-10-CM | POA: Diagnosis not present

## 2021-06-03 DIAGNOSIS — C50812 Malignant neoplasm of overlapping sites of left female breast: Secondary | ICD-10-CM | POA: Diagnosis present

## 2021-06-03 DIAGNOSIS — M549 Dorsalgia, unspecified: Secondary | ICD-10-CM | POA: Insufficient documentation

## 2021-06-03 DIAGNOSIS — Z923 Personal history of irradiation: Secondary | ICD-10-CM | POA: Diagnosis not present

## 2021-06-03 DIAGNOSIS — E039 Hypothyroidism, unspecified: Secondary | ICD-10-CM | POA: Diagnosis not present

## 2021-06-03 LAB — CMP (CANCER CENTER ONLY)
ALT: 16 U/L (ref 0–44)
AST: 20 U/L (ref 15–41)
Albumin: 3.8 g/dL (ref 3.5–5.0)
Alkaline Phosphatase: 86 U/L (ref 38–126)
Anion gap: 10 (ref 5–15)
BUN: 13 mg/dL (ref 6–20)
CO2: 24 mmol/L (ref 22–32)
Calcium: 8.8 mg/dL — ABNORMAL LOW (ref 8.9–10.3)
Chloride: 104 mmol/L (ref 98–111)
Creatinine: 0.67 mg/dL (ref 0.44–1.00)
GFR, Estimated: >60 mL/min (ref >60)
Glucose, Bld: 73 mg/dL (ref 70–99)
Potassium: 4 mmol/L (ref 3.5–5.1)
Sodium: 138 mmol/L (ref 135–145)
Total Bilirubin: 0.4 mg/dL (ref 0.3–1.2)
Total Protein: 7 g/dL (ref 6.5–8.1)

## 2021-06-03 LAB — TSH: TSH: 13.733 u[IU]/mL — ABNORMAL HIGH (ref 0.308–3.960)

## 2021-06-03 LAB — CBC WITH DIFFERENTIAL (CANCER CENTER ONLY)
Abs Immature Granulocytes: 0.02 10*3/uL (ref 0.00–0.07)
Basophils Absolute: 0.1 10*3/uL (ref 0.0–0.1)
Basophils Relative: 1 %
Eosinophils Absolute: 0.2 10*3/uL (ref 0.0–0.5)
Eosinophils Relative: 3 %
HCT: 36.7 % (ref 36.0–46.0)
Hemoglobin: 12.1 g/dL (ref 12.0–15.0)
Immature Granulocytes: 0 %
Lymphocytes Relative: 30 %
Lymphs Abs: 1.9 10*3/uL (ref 0.7–4.0)
MCH: 30.3 pg (ref 26.0–34.0)
MCHC: 33 g/dL (ref 30.0–36.0)
MCV: 91.8 fL (ref 80.0–100.0)
Monocytes Absolute: 0.5 10*3/uL (ref 0.1–1.0)
Monocytes Relative: 9 %
Neutro Abs: 3.6 10*3/uL (ref 1.7–7.7)
Neutrophils Relative %: 57 %
Platelet Count: 294 10*3/uL (ref 150–400)
RBC: 4 MIL/uL (ref 3.87–5.11)
RDW: 13 % (ref 11.5–15.5)
WBC Count: 6.3 10*3/uL (ref 4.0–10.5)
nRBC: 0 % (ref 0.0–0.2)

## 2021-06-03 MED ORDER — LEVOTHYROXINE SODIUM 125 MCG PO TABS: 125.0000 ug | ORAL_TABLET | Freq: Every day | ORAL | Status: DC

## 2021-06-03 NOTE — Assessment & Plan Note (Signed)
04/25/2019:Patient palpated a left breast and axillary mass x2month. [place] the left breast showed a 3.6cm mass at the 1 o'clock position with 6 smaller adjacent masses extending toward the nipple ranging in size from 1.1cm to 4.0cm. [place] the left axilla showed five bulky lymph nodes, the largest measuring 4.2cm, and second largest measuring 2.8cm. 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 T1c N2 stage IIIc Based on PET CT scan showing bilateral cervical nodes, it is stage IV(however her goal is to cure given the oligometastatic nature of her cancer.)  01/10/2020:Bilateral mastectomies with reconstruction ([facility]): Right breast: no evidence of malignancy Left breast: no evidence of residual carcinoma and 4 benign lymph nodes. --------------------------------------------------------------------------------------------------------------------------------------------------------------------------- Treatment summary: 1.Adjuvant radiationcompleted 04/18/2020 2.adjuvantPembrolizumab maintenance therapycompleted 07/26/2020  Immune mediated adverse effects: Hypothyroidism,on Synthroid, under the care of endocrinology.  Breast cancer surveillance: 1.  No role of imaging studies since she had bilateral mastectomies 2. breast exam: Benign  CT CAP 08/10/2020: Subtle areas of hyperenhancement in the liver parenchyma probably benign (fatty liver) cannot rule out other causes and this suggested an MRI of the abdomen

## 2021-06-04 ENCOUNTER — Telehealth: Payer: Self-pay | Admitting: Hematology and Oncology

## 2021-06-04 LAB — T4: T4, Total: 9.1 ug/dL (ref 4.5–12.0)

## 2021-06-04 NOTE — Telephone Encounter (Signed)
Scheduled appointment per 2/20 los. Patient is aware.

## 2021-06-10 NOTE — Progress Notes (Signed)
NIPPLE AREOLAR TATTOO PROCEDURE ? ?PREOPERATIVE DIAGNOSIS:  ?Acquired absence of bilateral nipple areolar  ? ?POSTOPERATIVE DIAGNOSIS: ?Acquired absence of bilateral nipple areolar   ? ?PROCEDURES: Bilateral nipple areolar tattoo  ? ?ANESTHESIA:  EMLA, 4% Lidocaine with epinephrine ? ?COMPLICATIONS: None. ? ?JUSTIFICATION FOR PROCEDURE:  ?Heather Mckenzie is a 41 y.o. female with a history of breast cancer status post breast reconstruction. The patient presents for nipple areolar complex tattoo. Risks, benefits, indications, and alternatives of the above described procedures were discussed with the patient and all the patient's questions were answered.  ? ?Today, patient presents for her first touchup, accompanied by her husband.  Remarkably, her ink did not fade too much since her initial tattoo procedure.  Her left side is much lighter and faded compared to the right, but largely unchanged compared to when her initial tattoo procedure concluded. ? ?Today she is hoping that we can darken her left areola to match more closely with the right.  She understands limitations due to her history of radiation.  She also does not like how her nipples are a bright white and would prefer for a pink complexion.  We will still attempt to have it be lighter compared with rest of areola to help with projection and distinction.  She is also ready for some fine detailing such as Montgomery's tubercles. ? ? ?DESCRIPTION OF PROCEDURE: ?After written informed consent was obtained and proper identification of patient and surgical site was made, the patient was taken to the procedure room and placed supine on the operating room table. A time out was performed to confirm patient's identity and surgical site. The patient was prepped and draped in the usual sterile fashion. Alcohol swabs were used. Attention was turned to the selection of flesh colored permanent tattoo ink to produce the appropriate color for nipple areolar complex tattooing. Using  a digital revo 7R tattoo head, pointillism pigment was instilled to the designed nipple areolar complex which was confirmed preoperatively with the patient using the Digital-Pop Deluxe machine by [employer]. Spiral technique was used to fill out the areola.  Manual approach using SofTap #36 and SofTap #18 and hand tapping tools was also utilized.  Once adequate pigment had been applied to the nipple areolar complex, tattoo site was dressed with Xeroform gauze followed by 4 x 4 gauze and secured with Medipore tape. There were no complications and the procedure was tolerated without difficulty.  She will provide Vaseline 1-2 times daily as needed for the next 1 to 2 weeks. ? ?Areola-color: [place], Fair Peach, Tan Mink in 4:4:2 drop ratio respectively.   ?Nipple shading: Same colors as above but in 4:4:6 ratio plus a single drop of cool mink for nipple shading. ?Nipple: Bright Peach, Fair Peach, and Portrait White in 8:8:2 drop ratio.   ?Montgomery's tubercles: 15 drops Portrait White, 1 drop [place]. ? ?Colors EXP: 04/2024 ? ?Plan is for her to return in 8 weeks for 1.5-hour touchup. ? ? ?

## 2021-06-13 ENCOUNTER — Ambulatory Visit (INDEPENDENT_AMBULATORY_CARE_PROVIDER_SITE_OTHER): Payer: 59 | Admitting: Physician Assistant

## 2021-06-13 ENCOUNTER — Other Ambulatory Visit: Payer: Self-pay

## 2021-06-13 DIAGNOSIS — Z9013 Acquired absence of bilateral breasts and nipples: Secondary | ICD-10-CM

## 2021-06-18 ENCOUNTER — Telehealth: Payer: Self-pay | Admitting: *Deleted

## 2021-06-18 NOTE — Telephone Encounter (Signed)
Received call from pt stating [place] is no longer a preferred location with her insurance company.  RN instructed pt to contact her insurance company and request a list of local facility's that are in network.  Once obtained, pt will contact our office and we will fax over order for breast US.  ?

## 2021-06-20 ENCOUNTER — Encounter: Payer: Self-pay | Admitting: Rehabilitation

## 2021-06-20 ENCOUNTER — Ambulatory Visit (HOSPITAL_COMMUNITY)
Admission: RE | Admit: 2021-06-20 | Discharge: 2021-06-20 | Disposition: A | Discharge status: Home / Self Care | Payer: 59 | Source: Ambulatory Visit | Attending: Hematology and Oncology | Admitting: Hematology and Oncology

## 2021-06-20 ENCOUNTER — Ambulatory Visit: Payer: 59 | Attending: Surgery | Admitting: Rehabilitation

## 2021-06-20 ENCOUNTER — Other Ambulatory Visit: Payer: Self-pay

## 2021-06-20 DIAGNOSIS — C50812 Malignant neoplasm of overlapping sites of left female breast: Secondary | ICD-10-CM | POA: Diagnosis present

## 2021-06-20 DIAGNOSIS — Z171 Estrogen receptor negative status [ER-]: Secondary | ICD-10-CM | POA: Insufficient documentation

## 2021-06-20 DIAGNOSIS — Z483 Aftercare following surgery for neoplasm: Secondary | ICD-10-CM | POA: Insufficient documentation

## 2021-06-20 MED ORDER — SODIUM CHLORIDE (PF) 0.9 % IJ SOLN
INTRAMUSCULAR | Status: AC
  Filled 2021-06-20: qty 50

## 2021-06-20 MED ORDER — IOHEXOL 300 MG/ML  SOLN
100.0000 mL | Freq: Once | INTRAMUSCULAR | Status: AC | PRN
  Administered 2021-06-20: 11:00:00 100 mL via INTRAVENOUS
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
## 2021-07-15 ENCOUNTER — Ambulatory Visit
Admission: EM | Admit: 2021-07-15 | Discharge: 2021-07-15 | Disposition: A | Discharge status: Home / Self Care | Payer: 59 | Attending: Family Medicine | Admitting: Family Medicine

## 2021-07-15 DIAGNOSIS — H1033 Unspecified acute conjunctivitis, bilateral: Secondary | ICD-10-CM

## 2021-07-15 MED ORDER — POLYMYXIN B-TRIMETHOPRIM 10000-0.1 UNIT/ML-% OP SOLN: 1.0000 [drp] | Freq: Four times a day (QID) | OPHTHALMIC | 0 refills | Status: DC

## 2021-07-15 NOTE — ED Triage Notes (Signed)
Pt states her right eye started running on Saturday night but stated getting better ? ?Pt states that her right eye started with irritation yesterday and it progressed to mucus coming out of it and swollen ? ?Pt states she tried OTC pink eye drops and it felt better for a short time  ? ? ?Denies Fever ?
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
## 2021-08-16 ENCOUNTER — Ambulatory Visit: Payer: Self-pay | Admitting: Physician Assistant

## 2021-08-19 ENCOUNTER — Encounter: Payer: Self-pay | Admitting: Physician Assistant

## 2021-08-19 ENCOUNTER — Ambulatory Visit (INDEPENDENT_AMBULATORY_CARE_PROVIDER_SITE_OTHER): Payer: 59 | Admitting: Physician Assistant

## 2021-08-19 DIAGNOSIS — Z853 Personal history of malignant neoplasm of breast: Secondary | ICD-10-CM | POA: Diagnosis not present

## 2021-08-19 DIAGNOSIS — Z9013 Acquired absence of bilateral breasts and nipples: Secondary | ICD-10-CM

## 2021-08-19 NOTE — Progress Notes (Addendum)
NIPPLE AREOLAR TATTOO PROCEDURE  PREOPERATIVE DIAGNOSIS:  Acquired absence of bilateral nipple areolar   POSTOPERATIVE DIAGNOSIS: Acquired absence of bilateral nipple areolar    PROCEDURES: Bilateral nipple areolar tattoo   ANESTHESIA:  EMLA, 4% Lidocaine with epinephrine  COMPLICATIONS: None.  JUSTIFICATION FOR PROCEDURE:  Heather Mckenzie is a 41 y.o. female with a history of breast cancer status post breast reconstruction. The patient presents for nipple areolar complex tattoo. Risks, benefits, indications, and alternatives of the above described procedures were discussed with the patient and all the patient's questions were answered.   Patient reports that she did well after last tattoo treatment.  She denies any scabbing or peeling.  Well managed with Vaseline, keeping the area moist.  She is overall quite pleased.  She is hoping for additional work to the [employer] as well as better nipple projection.  Preprocedural photos obtained, areola on left side is mildly faded compared to the right side.  This does not bother the patient.  She is overall pleased.  After discussion, we will focus on the bilateral nipples and Montgomery tubercles exclusively.  DESCRIPTION OF PROCEDURE: After written informed consent was obtained and proper identification of patient and surgical site was made, the patient was taken to the procedure room and placed supine on the operating room table. A time out was performed to confirm patient's identity and surgical site. The patient was prepped and draped in the usual sterile fashion. Alcohol swabs were used. Attention was turned to the selection of flesh colored permanent tattoo ink to produce the appropriate color for her nipple and Montgomery tubercle tattooing. Using a digital revo 7R tattoo head, pointillism pigment was instilled to the designed nipple areolar complex.  Specifically, started by catching up her Montgomery tubercles bilaterally before  proceeding to additional nipple filling bilaterally.  Lastly, added 2 drops of Tan Mink and 1 drop of Cool Mink to the nipple tattoo ink and softened the borders for nipple shading.  Once adequate pigment had been applied to the nipple areolar complex, Vaseline and dressing was applied. There were no complications and the procedure was tolerated without difficulty.    After conclusion of visit, she is pleased with nipple tattoos.  We will tentatively schedule her for touchup in 8 weeks (1 hour), but will connect via MyChart in 4 weeks to determine whether or not additional testing will be necessary.  Each nipple tattoo is approximately 4.5 cm in diameter.    Nipple: Bright Peach, Fair Peach, and Portrait White in 8:8:2 drop ratio.   Montgomery's tubercles: 15 drops Portrait White, 1 drop Bright Peach.  Colors EXP: 04/2024

## 2021-08-28 ENCOUNTER — Ambulatory Visit: Payer: 59 | Admitting: Surgical

## 2021-08-28 ENCOUNTER — Other Ambulatory Visit: Payer: Self-pay | Admitting: *Deleted

## 2021-08-28 DIAGNOSIS — Z171 Estrogen receptor negative status [ER-]: Secondary | ICD-10-CM

## 2021-08-28 NOTE — Progress Notes (Signed)
Per MD request, verbal orders received to obtain CT CAP fist week of September 2023 for continual evaluation of hyperenhancing area along gallbladder fossa.  ?

## 2021-09-04 ENCOUNTER — Ambulatory Visit (INDEPENDENT_AMBULATORY_CARE_PROVIDER_SITE_OTHER): Payer: 59 | Admitting: Surgical

## 2021-09-04 DIAGNOSIS — Z9013 Acquired absence of bilateral breasts and nipples: Secondary | ICD-10-CM

## 2021-09-04 DIAGNOSIS — Z171 Estrogen receptor negative status [ER-]: Secondary | ICD-10-CM

## 2021-09-04 DIAGNOSIS — N6459 Other signs and symptoms in breast: Secondary | ICD-10-CM

## 2021-09-04 NOTE — Progress Notes (Signed)
   Referring Provider No referring provider defined for this encounter.   CC:  Chief Complaint  Patient presents with   Follow-up      Heather Mckenzie is an 41 y.o. female.  HPI: Patient is a 41 year old female here for follow-up after exchange of bilateral breast tissue expanders for gel implants and fat grafting to bilateral breasts with Dr. Claudia Desanctis on 10/2020.  She is approximately 10 months postop.  She reports overall she is doing really well, she is currently undergoing bilateral nipple areolar tattooing, reports this is going well.  She does report that she has some firmness of the left breast that is still present, she did have a CT scan on 06/2021.  CT scan showed multiple circumscribed central fat density areas throughout the left breast consistent with fat necrosis.  She reports that overall things are going well, she does not have any issues at this time.  She does not feel as if the areas in the left breast have increased or decreased in size, reports no tenderness unless palpated.  Review of Systems General: No fevers or chills  Physical Exam    07/15/2021    9:14 AM 07/15/2021    8:48 AM 06/03/2021    3:04 PM  Vitals with BMI  Height   '5\' 3"'$   Weight   231 lbs 13 oz  BMI   76.22  Systolic 633 354 562  Diastolic 90 0 68  Pulse  88 82    General:  No acute distress,  Alert and oriented, Non-Toxic, Normal speech and affect Breast: Bilateral breast incisions are well-healed, bilateral NAC tattoos noted, bilateral breasts are symmetric.  Some tenderness and areas of fat necrosis noted with palpation of the left breast, no overlying skin changes.  No erythema or cellulitic change.  Bilateral breast capsules are soft, no contracture noted.  Assessment/Plan 41 year old female status post bilateral breast reconstruction for breast cancer, she had gel implants placed on 10/2020 with Dr. Claudia Desanctis.  She is now currently undergoing bilateral NAC tattooing.  This is going well.  I do not see  any signs of concern on exam, has some fat necrosis still present from the fat grafting to the breasts.  I do not feel as if any further imaging is necessary at this time, we discussed continuing with close monitoring for any overlying skin changes or increase in size.  Recommend continuing with nipple areola tattooing, follow-up in 1 year for reevaluation of her reconstruction.  Heather Mckenzie Heather Mckenzie 09/04/2021, 11:08 AM

## 2021-09-10 ENCOUNTER — Other Ambulatory Visit: Payer: Self-pay

## 2021-09-10 DIAGNOSIS — C50812 Malignant neoplasm of overlapping sites of left female breast: Secondary | ICD-10-CM

## 2021-09-16 ENCOUNTER — Ambulatory Visit: Payer: 59 | Attending: Surgery

## 2021-09-16 VITALS — Wt 239.1 lb

## 2021-09-16 DIAGNOSIS — Z483 Aftercare following surgery for neoplasm: Secondary | ICD-10-CM | POA: Insufficient documentation
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
## 2021-09-26 ENCOUNTER — Ambulatory Visit: Payer: Self-pay | Admitting: *Deleted

## 2021-09-26 VITALS — BP 118/84 | Wt 234.7 lb

## 2021-09-26 DIAGNOSIS — N6325 Unspecified lump in the left breast, overlapping quadrants: Secondary | ICD-10-CM

## 2021-09-26 DIAGNOSIS — Z1239 Encounter for other screening for malignant neoplasm of breast: Secondary | ICD-10-CM

## 2021-09-26 NOTE — Patient Instructions (Signed)
Explained breast self awareness with Heather Mckenzie. Patient did not need a Pap smear today due to last Pap smear and HPV typing was 03/17/2019. Let her know BCCCP will cover Pap smears and HPV typing every 5 years unless has a history of abnormal Pap smears. Referred patient to the [facility] for a diagnostic mammogram. Appointment scheduled Tuesday, October 22, 2021 at 0950. Patient aware of appointment and will be there. [place] verbalized understanding.  Izen Petz, Arvil Chaco, RN 3:17 PM

## 2021-09-26 NOTE — Progress Notes (Signed)
Heather Mckenzie is a 41 y.o. female who presents to [facility] clinic today with complaint of left breast lump. Patient has a history of bilateral mastectomy and reconstruction in July 2022 for breast cancer.   Pap Smear: Pap smear not completed today. Last Pap smear was 03/17/2019 at [facility] clinic and was normal with negative HPV. Per patient has no history of an abnormal Pap smear. Last Pap smear result is available in Epic.   Physical exam: Breasts Right breast slightly larger than left breast. Scars observed bilateral breasts due to history of bilateral mastectomy and reconstruction surgery in July 2022. Patient has bilateral nipple tattoo's from reconstruction surgery. No lymphadenopathy. No lumps palpated right breast. Palpated a lump within the left breast at 12 o'clock 8 cm from the nipple tattoo that measured 3 cm in length. Palpated a hardened area within the left breast between 7 o'clock and 9 o'clock 6 cm from the nipple tattoo at site where scar observed from breast surgery. No complaints of pain or tenderness on exam.    MM CLIP PLACEMENT LEFT  Result Date: 04/22/2019 CLINICAL DATA:  41 year old patient had 3 ultrasound-guided core needle biopsies performed today. EXAM: DIAGNOSTIC LEFT MAMMOGRAM POST ULTRASOUND BIOPSIES COMPARISON:  Previous exam(s). FINDINGS: Mammographic images were obtained following ULTRASOUND guided biopsy of 2 LEFT BREAST MASSES AND A LEFT AXILLARY LYMPH NODE. The ribbon shaped biopsy clip is satisfactorily positioned in the biopsied upper inner quadrant left breast mass, 10 o'clock position. Coil shaped biopsy clip is satisfactorily positioned in the upper outer quadrant 1 o'clock position biopsied mass. Q shaped biopsy clip satisfactorily positioned within the biopsied lymph node. IMPRESSION: Appropriate positioning of 3 marking clips at the site of 2 breast biopsies and a left axillary lymph node biopsy. Final Assessment: Post Procedure Mammograms for  Marker Placement Electronically Signed   By: Curlene Dolphin M.D.   On: 04/22/2019 09:21    Pelvic/Bimanual Pap is not indicated today per BCCCP guidelines.   Smoking History: Patient has never smoked.  Patient Navigation: Patient education provided. Access to services provided for patient through [facility] program.    Breast and Cervical Cancer Risk Assessment: Patient does not have family history of breast cancer. Patient has personal history of breast cancer. Patient has no known genetic mutations or history of radiation treatment to the chest before age 71. Patient does not have history of cervical dysplasia, immunocompromised, or DES exposure in-utero.  Risk Assessment     Risk Scores       09/26/2021   Last edited by: Royston Bake, CMA   5-year risk:    Lifetime risk:             A: BCCCP exam without pap smear Complaint of left breast lump.  P: Referred patient to the [facility] for a diagnostic mammogram. Appointment scheduled Tuesday, October 22, 2021 at 0950.  Loletta Parish, RN 09/26/2021 3:17 PM

## 2021-10-03 ENCOUNTER — Other Ambulatory Visit: Payer: 59

## 2021-10-14 ENCOUNTER — Ambulatory Visit: Payer: 59 | Admitting: Physician Assistant

## 2021-10-14 DIAGNOSIS — Z9013 Acquired absence of bilateral breasts and nipples: Secondary | ICD-10-CM

## 2021-10-14 DIAGNOSIS — Z853 Personal history of malignant neoplasm of breast: Secondary | ICD-10-CM

## 2021-10-14 NOTE — Progress Notes (Signed)
NIPPLE AREOLAR TATTOO PROCEDURE  PREOPERATIVE DIAGNOSIS:  Acquired absence of bilateral nipple areolar   POSTOPERATIVE DIAGNOSIS: Acquired absence of bilateral nipple areolar    PROCEDURES: Bilateral nipple areolar tattoo   ANESTHESIA:  EMLA, 4% Lidocaine with epinephrine  COMPLICATIONS: None.  JUSTIFICATION FOR PROCEDURE:  Heather Mckenzie is a 41 y.o. female with a history of breast cancer status post breast reconstruction. The patient presents for nipple areolar complex tattoo. Risks, benefits, indications, and alternatives of the above described procedures were discussed with the patient and all the patient's questions were answered.   Today, patient reports that she is hoping for additional detailing to Heather Mckenzie bilateral nipple tattoo.  Heather Mckenzie left nipple areolar tattoo is persistently less saturated than the right side, likely due to radiation.  She understands the limitations, but is hoping for repeat tattooing in effort to make them appear more symmetric in color.  She is also hoping for tattooing bilaterally to help with the nipple projection.  Photos obtained before and after nipple tattoo procedure today.  DESCRIPTION OF PROCEDURE: After written informed consent was obtained and proper identification of patient and surgical site was made, the patient was taken to the procedure room and placed supine on the operating room table. A time out was performed to confirm patient's identity and surgical site. The patient was prepped and draped in the usual sterile fashion. Alcohol swabs were used. Attention was turned to the selection of flesh colored permanent tattoo ink to produce the appropriate color for Heather Mckenzie nipple and Montgomery tubercle tattooing. Using a digital revo 7R tattoo head, pointillism pigment was instilled to the designed nipple areolar complex.  Specifically, started by catching up Heather Mckenzie Montgomery tubercles bilaterally before proceeding to additional nipple filling bilaterally.  Lastly,  added 2 drops of Tan Mink and 1 drop of Cool Mink to the nipple tattoo ink and softened the borders for nipple shading.  The left areola was tattooed, but in 4:4:3 ratio rather than 4:4:2 ratio in effort to make it darker to better match the right side.  On the right side, additional Montgomery tubercle and nipple filling was performed.  Once adequate pigment had been applied to the nipple areolar complexes, Vaseline and dressing was applied. There were no complications and the procedure was tolerated without difficulty.    Plan is for Heather Mckenzie to schedule a telephone visit in 30 days to discuss additional tattooing.  She will plan to upload pictures via [facility].  Each nipple tattoo is approximately 4.75 cm in diameter.    Left Areola: Bright Peach, Fair Peach, Tan Mink in 4:4:3 drop ratio respectively. (Rather than initial 4:4:2 ratio.)    Nipple: Bright Peach, Fair Peach, and Portrait White in 8:8:2 drop ratio.   Montgomery's tubercles: 15 drops Portrait White, 1 drop Bright Peach.  Colors EXP: 04/2024

## 2021-10-22 ENCOUNTER — Other Ambulatory Visit: Payer: Self-pay | Admitting: Obstetrics and Gynecology

## 2021-10-22 ENCOUNTER — Ambulatory Visit
Admission: RE | Admit: 2021-10-22 | Discharge: 2021-10-22 | Disposition: A | Discharge status: Home / Self Care | Payer: No Typology Code available for payment source | Source: Ambulatory Visit | Attending: Obstetrics and Gynecology | Admitting: Obstetrics and Gynecology

## 2021-10-22 DIAGNOSIS — N6320 Unspecified lump in the left breast, unspecified quadrant: Secondary | ICD-10-CM

## 2021-10-22 DIAGNOSIS — Z171 Estrogen receptor negative status [ER-]: Secondary | ICD-10-CM

## 2021-10-28 ENCOUNTER — Ambulatory Visit
Admission: RE | Admit: 2021-10-28 | Discharge: 2021-10-28 | Disposition: A | Discharge status: Home / Self Care | Payer: No Typology Code available for payment source | Source: Ambulatory Visit | Attending: Obstetrics and Gynecology | Admitting: Obstetrics and Gynecology

## 2021-10-28 DIAGNOSIS — N6320 Unspecified lump in the left breast, unspecified quadrant: Secondary | ICD-10-CM

## 2021-11-11 ENCOUNTER — Ambulatory Visit (INDEPENDENT_AMBULATORY_CARE_PROVIDER_SITE_OTHER): Payer: 59 | Admitting: Physician Assistant

## 2021-11-11 DIAGNOSIS — Z9013 Acquired absence of bilateral breasts and nipples: Secondary | ICD-10-CM

## 2021-11-11 NOTE — Progress Notes (Signed)
Connected with patient over telephone encounter to discuss whether not she would like to proceed with additional tattooing.  She has already had multiple sessions and the left areola remains mildly faded compared to the contralateral side.  This is likely attributable to her history of radiation.  Rather than returning for additional areolar fill, plan is for her to reconnect in 4 to 6 months for in-office consult to determine whether or not additional tattooing is a good option.

## 2022-01-06 ENCOUNTER — Ambulatory Visit: Payer: 59 | Attending: Hematology and Oncology

## 2022-01-06 VITALS — Wt 246.1 lb

## 2022-01-06 DIAGNOSIS — Z483 Aftercare following surgery for neoplasm: Secondary | ICD-10-CM | POA: Insufficient documentation
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
## 2022-01-07 ENCOUNTER — Inpatient Hospital Stay: Payer: 59 | Admitting: Hematology and Oncology

## 2022-01-19 NOTE — Progress Notes (Signed)
Patient Care Team: Patient, No Pcp Per as PCP - General (General Practice) [name], MD as Consulting Physician (General Surgery) [name], MD as Consulting Physician (Hematology and Oncology) [name], MD as Consulting Physician (Radiation Oncology) [name], MD (Inactive) as Consulting Physician (Plastic Surgery) Madelin Rear, MD as Consulting Physician (Endocrinology) Earnstine Regal, PA-C as Physician Assistant (Obstetrics and Gynecology)  DIAGNOSIS:  Encounter Diagnosis  Name Primary?   Malignant neoplasm of overlapping sites of left breast in female, estrogen receptor negative ([place])     SUMMARY OF ONCOLOGIC HISTORY: Oncology History  Malignant neoplasm of overlapping sites of left breast in female, estrogen receptor negative ([place])  04/25/2019 Initial Diagnosis   Patient palpated a left breast and axillary mass x66month. [place] the left breast showed a 3.6cm mass at the 1 o'clock position with 6 smaller adjacent masses extending toward the nipple ranging in size from 1.1cm to 4.0cm. [place] the left axilla showed five bulky lymph nodes, the largest measuring 4.2cm, and second largest measuring 2.8cm. 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    04/27/2019 Cancer Staging   Staging form: Breast, AJCC 8th Edition - Clinical: Stage IIIC (cT1c, cN2a, cM0, G3, ER-, PR-, HER2-) - Signed by GNicholas Lose MD on 04/27/2019    Genetic Testing   Negative genetic testing. No pathogenic variants identified. VUS in HOXB13 called c.473C>A, VUS in POLD1 called c.1610G>C, and VUS in RAD50 called c.1094G>A identified on the [facility] Common Hereditary Cancers Panel. The report date is 05/10/2019.  The Common Hereditary Cancers Panel offered by [facility] includes sequencing and/or deletion duplication testing of the following 48 genes: APC, ATM, AXIN2, BARD1, BMPR1A, BRCA1, BRCA2, BRIP1, CDH1, CDKN2A (p14ARF), CDKN2A (p16INK4a), CKD4,  CHEK2, CTNNA1, DICER1, EPCAM (Deletion/duplication testing only), GREM1 (promoter region deletion/duplication testing only), KIT, MEN1, MLH1, MSH2, MSH3, MSH6, MUTYH, NBN, NF1, NHTL1, PALB2, PDGFRA, PMS2, POLD1, POLE, PTEN, RAD50, RAD51C, RAD51D, RNF43, SDHB, SDHC, SDHD, SMAD4, SMARCA4. STK11, TP53, TSC1, TSC2, and VHL.  The following genes were evaluated for sequence changes only: SDHA and HOXB13 c.251G>A variant only.   05/12/2019 - 10/27/2019 Neo-Adjuvant Chemotherapy   Keytruda/Adriamycin/Cytoxan q3 wks x 4 cycles 05/12/2019-07/14/2019 Keytruda d1/Carbo d1/Taxol d1,d8,d15 q21 days x 4 cycles 08/04/2019-10/27/19   01/10/2020 Surgery   Bilateral mastectomies with reconstruction ([facility] & [facility]): Right breast: no evidence tof malignancy Left breast: no evidence of residual carcinoma and 4 benign lymph nodes.    01/10/2020 Cancer Staging   Staging form: Breast, AJCC 8th Edition - Pathologic stage from 01/10/2020: No Stage Recommended (ypT0, pN0, cM0) - Signed by CGardenia Phlegm NP on 11/20/2020 Stage prefix: Post-therapy   02/10/2020 - 07/26/2020 Adjuvant Chemotherapy   Keytruda every 3 weeks   02/27/2020 - 04/18/2020 Radiation Therapy   Adjuvant radiation ([place]) Left breast / 3D. A total of 50.4 Gy was given in 1.8 Gy per fraction for 28 fractions. 10X, 15X Left breast SCLV / 3D. A total of 50.4 Gy was given in 1.8 Gy per fraction for 28 fractions. 6X, 10X Left breast boost / 3D. A total of 10 Gy was given in 2 Gy per fraction for 5 fractions. 6E      CHIEF COMPLIANT: Follow-up of triple negative breast cancer    INTERVAL HISTORY: CKYAIRA TRANTHAMis a 41y.o. with above-mentioned history of triple negative breast cancer who underwent neoadjuvant chemotherapy and had a complete pathologic response and is currently on maintenance therapy with pembrolizumab.  She presents to the clinic today for follow-up. She states that she has been doing good. She reports her energy is good. She denies  numbness and tingling in finger and toes.  ALLERGIES:  has No Known Allergies.  MEDICATIONS:  Current Outpatient Medications  Medication Sig Dispense Refill   levothyroxine (SYNTHROID) 125 MCG tablet Take 1 tablet (125 mcg total) by mouth daily before breakfast.     levothyroxine (SYNTHROID) 88 MCG tablet      paragard intrauterine copper IUD IUD ParaGard T 380A 380 square mm intrauterine device  1 Paragard IUD device placed within uterine cavity; Device taken from [employer]     No current facility-administered medications for this visit.    PHYSICAL EXAMINATION: ECOG PERFORMANCE STATUS: 1 - Symptomatic but completely ambulatory  Vitals:   01/23/22 1149  BP: 125/72  Pulse: 91  Resp: 18  Temp: (!) 97.2 F (36.2 C)  SpO2: 100%   Filed Weights   01/23/22 1149  Weight: 246 lb 8 oz (111.8 kg)    BREAST: No palpable masses or nodules in either right or left breasts. No palpable axillary supraclavicular or infraclavicular adenopathy no breast tenderness or nipple discharge.  Left breast palpable nodularity of fat necrosis (exam performed in the presence of a chaperone)  LABORATORY DATA:  I have reviewed the data as listed    Latest Ref Rng & Units 06/03/2021    2:52 PM 11/05/2020    2:09 PM 07/26/2020   11:15 AM  CMP  Glucose 70 - 99 mg/dL 73  94  95   BUN 6 - 20 mg/dL '13  15  15   ' Creatinine 0.44 - 1.00 mg/dL 0.67  0.92  1.00   Sodium 135 - 145 mmol/L 138  138  141   Potassium 3.5 - 5.1 mmol/L 4.0  4.0  4.0   Chloride 98 - 111 mmol/L 104  102  103   CO2 22 - 32 mmol/L '24  29  26   ' Calcium 8.9 - 10.3 mg/dL 8.8  9.3  9.5   Total Protein 6.5 - 8.1 g/dL 7.0  7.8  7.5   Total Bilirubin 0.3 - 1.2 mg/dL 0.4  0.8  0.3   Alkaline Phos 38 - 126 U/L 86  83  107   AST 15 - 41 U/L 20  40  24   ALT 0 - 44 U/L 16  39  18     Lab Results  Component Value Date   WBC 6.3 06/03/2021   HGB 12.1 06/03/2021   HCT 36.7 06/03/2021   MCV 91.8 06/03/2021   PLT 294 06/03/2021    NEUTROABS 3.6 06/03/2021    ASSESSMENT & PLAN:  Malignant neoplasm of overlapping sites of left breast in female, estrogen receptor negative ([place]) 04/25/2019:Patient palpated a left breast and axillary mass x19month. [place]of the left breast showed a 3.6cm mass at the 1 o'clock position with 6 smaller adjacent masses extending toward the nipple ranging in size from 1.1cm to 4.0cm. [place] the left axilla showed five bulky lymph nodes, the largest measuring 4.2cm, and second largest measuring 2.8cm. 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  T1c N2 stage IIIc Based on PET CT scan showing bilateral cervical nodes, it is stage IV (however her goal is to cure given the oligometastatic nature of her cancer.)   01/10/2020: Bilateral mastectomies with reconstruction ([facility]): Right breast: no evidence of malignancy Left  breast: no evidence of residual carcinoma and 4 benign lymph nodes.  ---------------------------------------------------------------------------------------------------------------------------------------------------------------------------  Treatment summary:  1.  Adjuvant radiation completed 04/18/2020 2. adjuvant Pembrolizumab maintenance therapy completed 07/26/2020   Immune mediated adverse effects: Hypothyroidism, on Synthroid, under the care of endocrinology.   Breast cancer surveillance: 1.  No role of imaging studies since she had bilateral mastectomies 2. breast exam: Benign   CT CAP 08/10/2020: Subtle areas of hyperenhancement in the liver parenchyma probably benign (fatty liver) cannot rule out other causes and this suggested an MRI of the abdomen   Nodularity in the left breast: Biopsy July 2023: Fat necrosis.  Abdominal pain and back pain: CT CAP 01/20/2022: No evidence of metastatic disease, fatty liver  Return to clinic in 1 year for follow-up.  We will do scans on an as-needed basis.    No orders of the defined types  were placed in this encounter.  The patient has a good understanding of the overall plan. she agrees with it. she will call with any problems that may develop before the next visit here. Total time spent: 30 mins including face to face time and time spent for planning, charting and co-ordination of care   Harriette Ohara, MD 01/23/22    I Gardiner Coins am scribing for Dr. Lindi Adie  I have reviewed the above documentation for accuracy and completeness, and I agree with the above.

## 2022-01-20 ENCOUNTER — Ambulatory Visit (HOSPITAL_COMMUNITY)
Admission: RE | Admit: 2022-01-20 | Discharge: 2022-01-20 | Disposition: A | Discharge status: Home / Self Care | Payer: 59 | Source: Ambulatory Visit | Attending: Hematology and Oncology | Admitting: Hematology and Oncology

## 2022-01-20 DIAGNOSIS — Z171 Estrogen receptor negative status [ER-]: Secondary | ICD-10-CM | POA: Diagnosis not present

## 2022-01-20 DIAGNOSIS — C50812 Malignant neoplasm of overlapping sites of left female breast: Secondary | ICD-10-CM | POA: Insufficient documentation

## 2022-01-20 DIAGNOSIS — N641 Fat necrosis of breast: Secondary | ICD-10-CM | POA: Diagnosis not present

## 2022-01-20 DIAGNOSIS — N83202 Unspecified ovarian cyst, left side: Secondary | ICD-10-CM | POA: Diagnosis not present

## 2022-01-20 DIAGNOSIS — Z9882 Breast implant status: Secondary | ICD-10-CM | POA: Diagnosis not present

## 2022-01-20 DIAGNOSIS — Z853 Personal history of malignant neoplasm of breast: Secondary | ICD-10-CM | POA: Diagnosis not present

## 2022-01-20 DIAGNOSIS — K760 Fatty (change of) liver, not elsewhere classified: Secondary | ICD-10-CM | POA: Diagnosis not present

## 2022-01-20 MED ORDER — SODIUM CHLORIDE (PF) 0.9 % IJ SOLN
INTRAMUSCULAR | Status: AC
  Filled 2022-01-20: qty 50

## 2022-01-20 MED ORDER — IOHEXOL 300 MG/ML  SOLN
100.0000 mL | Freq: Once | INTRAMUSCULAR | Status: AC | PRN
  Administered 2022-01-20: 100 mL via INTRAVENOUS

## 2022-01-23 ENCOUNTER — Inpatient Hospital Stay: Payer: 59 | Attending: Hematology and Oncology | Admitting: Hematology and Oncology

## 2022-01-23 DIAGNOSIS — Z171 Estrogen receptor negative status [ER-]: Secondary | ICD-10-CM | POA: Insufficient documentation

## 2022-01-23 DIAGNOSIS — C50812 Malignant neoplasm of overlapping sites of left female breast: Secondary | ICD-10-CM | POA: Insufficient documentation

## 2022-01-23 NOTE — Assessment & Plan Note (Addendum)
04/25/2019:Patient palpated a left breast and axillary mass x47month. [place]of the left breast showed a 3.6cm mass at the 1 o'clock position with 6 smaller adjacent masses extending toward the nipple ranging in size from 1.1cm to 4.0cm. [place] the left axilla showed five bulky lymph nodes, the largest measuring 4.2cm, and second largest measuring 2.8cm. 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 T1c N2 stage IIIc Based on PET CT scan showing bilateral cervical nodes, it is stage IV(however her goal is to cure given the oligometastatic nature of her cancer.)  01/10/2020:Bilateral mastectomies with reconstruction ([facility]ace): Right breast: no evidence of malignancy Left breast: no evidence of residual carcinoma and 4 benign lymph nodes. --------------------------------------------------------------------------------------------------------------------------------------------------------------------------- Treatment summary: 1.Adjuvant radiationcompleted 04/18/2020 2.adjuvantPembrolizumab maintenance therapycompleted 07/26/2020  Immune mediated adverse effects: Hypothyroidism,on Synthroid, under the care of endocrinology.  Breast cancer surveillance: 1.  No role of imaging studies since she had bilateral mastectomies 2. breast exam: Benign  CT CAP 08/10/2020: Subtle areas of hyperenhancement in the liver parenchyma probably benign (fatty liver) cannot rule out other causes and this suggested an MRI of the abdomen  Nodularity in the left breast: Biopsy July 2023: Fat necrosis.  Abdominal pain and back pain: CT CAP 01/20/2022: No evidence of metastatic disease, fatty liver   Return to clinic in 1 year for follow-up.  We will do scans on an as-needed basis.

## 2022-06-05 ENCOUNTER — Ambulatory Visit: Payer: Self-pay | Admitting: Hematology and Oncology

## 2022-06-23 ENCOUNTER — Ambulatory Visit: Payer: 59 | Attending: Hematology and Oncology

## 2022-06-23 VITALS — Wt 250.5 lb

## 2022-06-23 DIAGNOSIS — Z483 Aftercare following surgery for neoplasm: Secondary | ICD-10-CM | POA: Insufficient documentation
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
## 2022-09-03 ENCOUNTER — Ambulatory Visit: Payer: 59 | Admitting: Surgical

## 2022-10-29 ENCOUNTER — Encounter: Payer: Self-pay | Admitting: Emergency Medicine

## 2022-10-29 ENCOUNTER — Ambulatory Visit
Admission: EM | Admit: 2022-10-29 | Discharge: 2022-10-29 | Disposition: A | Discharge status: Home / Self Care | Payer: Medicaid Other | Attending: Family Medicine | Admitting: Family Medicine

## 2022-10-29 DIAGNOSIS — M79605 Pain in left leg: Secondary | ICD-10-CM | POA: Diagnosis not present

## 2022-10-29 MED ORDER — NAPROXEN 500 MG PO TABS: 500.0000 mg | ORAL_TABLET | Freq: Two times a day (BID) | ORAL | 0 refills | Status: DC | PRN

## 2022-10-29 NOTE — ED Triage Notes (Signed)
Left knee pain x 1 week.  Hurts to bear weight and to walk.  Feels tightness behind knee and pain that shoots down leg when walking.  No known injury.

## 2022-10-29 NOTE — Discharge Instructions (Addendum)
You may call [phone] to schedule your vascular ultrasound at [facility] facility. I have placed that order and they should be able to access it at that time. In the meantime, rest, ice, compression, elevation and OTC pain medications (I have sent over some naproxen for you as well). Follow up with Orthopedics if not improving
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
## 2022-10-30 ENCOUNTER — Telehealth: Payer: Self-pay

## 2022-10-30 DIAGNOSIS — M79605 Pain in left leg: Secondary | ICD-10-CM

## 2022-10-30 NOTE — Telephone Encounter (Signed)
Pts stated no order was in for her lower left leg.  Order was discontinued by someone. New order placed.

## 2022-10-30 NOTE — Telephone Encounter (Signed)
New order placed

## 2022-11-06 ENCOUNTER — Other Ambulatory Visit: Payer: Self-pay | Admitting: Family Medicine

## 2022-11-06 ENCOUNTER — Ambulatory Visit (HOSPITAL_COMMUNITY)
Admission: RE | Admit: 2022-11-06 | Discharge: 2022-11-06 | Disposition: A | Discharge status: Home / Self Care | Payer: Medicaid Other | Source: Ambulatory Visit | Attending: Family Medicine | Admitting: Family Medicine

## 2022-11-06 DIAGNOSIS — M79605 Pain in left leg: Secondary | ICD-10-CM | POA: Insufficient documentation

## 2022-11-06 NOTE — Telephone Encounter (Signed)
Urgent care patient Requested Prescriptions  Pending Prescriptions Disp Refills   naproxen (NAPROSYN) 500 MG tablet [Pharmacy Med Name: NAPROXEN 500MG  TABLETS] 20 tablet 0    Sig: TAKE 1 TABLET(500 MG) BY MOUTH TWICE DAILY AS NEEDED     There is no refill protocol information for this order

## 2022-12-22 ENCOUNTER — Ambulatory Visit: Payer: Medicaid Other | Attending: Hematology and Oncology

## 2022-12-22 VITALS — Wt 243.0 lb

## 2022-12-22 DIAGNOSIS — Z483 Aftercare following surgery for neoplasm: Secondary | ICD-10-CM | POA: Insufficient documentation
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
## 2023-01-20 ENCOUNTER — Other Ambulatory Visit: Payer: Self-pay | Admitting: *Deleted

## 2023-01-20 ENCOUNTER — Telehealth: Payer: Self-pay | Admitting: Hematology and Oncology

## 2023-01-20 DIAGNOSIS — C50812 Malignant neoplasm of overlapping sites of left female breast: Secondary | ICD-10-CM

## 2023-01-20 NOTE — Telephone Encounter (Signed)
01/20/23 Due to provider being out of office, I called patient and rescheduled appointment. During the phone call, the patient asked if Dr. Pamelia Hoit could order a CT scan Chest/Abd/Pelvis one week prior to her upcoming yearly visit. I sent staff message to the nurse and she said she will call the patient and let her know they can't add a scan without reason and she should discuss it with the provider at next appointment. Patient is aware of the new date and time of appointment.

## 2023-01-20 NOTE — Progress Notes (Signed)
Received call from pt requesting yearly CT CAP orders be placed. Verbal orders received by MD and placed.

## 2023-01-27 ENCOUNTER — Ambulatory Visit: Payer: Self-pay | Admitting: Hematology and Oncology

## 2023-02-02 ENCOUNTER — Ambulatory Visit (HOSPITAL_COMMUNITY)
Admission: RE | Admit: 2023-02-02 | Discharge: 2023-02-02 | Disposition: A | Discharge status: Home / Self Care | Payer: Medicaid Other | Source: Ambulatory Visit | Attending: Hematology and Oncology | Admitting: Hematology and Oncology

## 2023-02-02 DIAGNOSIS — C50812 Malignant neoplasm of overlapping sites of left female breast: Secondary | ICD-10-CM | POA: Insufficient documentation

## 2023-02-02 DIAGNOSIS — Z171 Estrogen receptor negative status [ER-]: Secondary | ICD-10-CM | POA: Diagnosis present

## 2023-02-02 MED ORDER — IOHEXOL 300 MG/ML  SOLN
100.0000 mL | Freq: Once | INTRAMUSCULAR | Status: AC | PRN
  Administered 2023-02-02: 100 mL via INTRAVENOUS

## 2023-02-02 MED ORDER — SODIUM CHLORIDE (PF) 0.9 % IJ SOLN
INTRAMUSCULAR | Status: AC
  Filled 2023-02-02: qty 50

## 2023-02-25 ENCOUNTER — Inpatient Hospital Stay: Payer: Medicaid Other | Attending: Hematology and Oncology | Admitting: Hematology and Oncology

## 2023-02-25 ENCOUNTER — Telehealth: Payer: Self-pay

## 2023-02-25 VITALS — BP 132/74 | HR 92 | Temp 97.8°F | Resp 18 | Ht 63.0 in | Wt 246.3 lb

## 2023-02-25 DIAGNOSIS — Z9013 Acquired absence of bilateral breasts and nipples: Secondary | ICD-10-CM | POA: Insufficient documentation

## 2023-02-25 DIAGNOSIS — R911 Solitary pulmonary nodule: Secondary | ICD-10-CM | POA: Insufficient documentation

## 2023-02-25 DIAGNOSIS — Z171 Estrogen receptor negative status [ER-]: Secondary | ICD-10-CM | POA: Insufficient documentation

## 2023-02-25 DIAGNOSIS — C50812 Malignant neoplasm of overlapping sites of left female breast: Secondary | ICD-10-CM | POA: Insufficient documentation

## 2023-02-25 DIAGNOSIS — E039 Hypothyroidism, unspecified: Secondary | ICD-10-CM | POA: Insufficient documentation

## 2023-02-25 DIAGNOSIS — N641 Fat necrosis of breast: Secondary | ICD-10-CM | POA: Insufficient documentation

## 2023-02-25 DIAGNOSIS — Z9221 Personal history of antineoplastic chemotherapy: Secondary | ICD-10-CM | POA: Insufficient documentation

## 2023-02-25 MED ORDER — LEVOTHYROXINE SODIUM 150 MCG PO TABS: 150.0000 ug | ORAL_TABLET | Freq: Every day | ORAL | Status: AC

## 2023-02-25 NOTE — Progress Notes (Signed)
Patient Care Team: Patient, No Pcp Per as PCP - General (General Practice) Harriette Bouillon, MD as Consulting Physician (General Surgery) Serena Croissant, MD as Consulting Physician (Hematology and Oncology) Antony Blackbird, MD as Consulting Physician (Radiation Oncology) Allena Napoleon, MD as Consulting Physician (Plastic Surgery) Marlene Lard, MD as Consulting Physician (Endocrinology) Henreitta Leber, PA-C as Physician Assistant (Obstetrics and Gynecology)  DIAGNOSIS:  Encounter Diagnoses  Name Primary?   Malignant neoplasm of overlapping sites of left breast in female, estrogen receptor negative (HCC) Yes   Hypothyroidism (acquired)    Lung nodule     SUMMARY OF ONCOLOGIC HISTORY: Oncology History  Malignant neoplasm of overlapping sites of left breast in female, estrogen receptor negative (HCC)  04/25/2019 Initial Diagnosis   Patient palpated a left breast and axillary mass x59months. US of the left breast showed a 3.6cm mass at the 1 o'clock position with 6 smaller adjacent masses extending toward the nipple ranging in size from 1.1cm to 4.0cm. US of the left axilla showed five bulky lymph nodes, the largest measuring 4.2cm, and second largest measuring 2.8cm. 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    04/27/2019 Cancer Staging   Staging form: Breast, AJCC 8th Edition - Clinical: Stage IIIC (cT1c, cN2a, cM0, G3, ER-, PR-, HER2-) - Signed by Serena Croissant, MD on 04/27/2019    Genetic Testing   Negative genetic testing. No pathogenic variants identified. VUS in HOXB13 called c.473C>A, VUS in POLD1 called c.1610G>C, and VUS in RAD50 called c.1094G>A identified on the [facility] Common Hereditary Cancers Panel. The report date is 05/10/2019.  The Common Hereditary Cancers Panel offered by [facility] includes sequencing and/or deletion duplication testing of the following 48 genes: APC, ATM, AXIN2, BARD1, BMPR1A, BRCA1, BRCA2, BRIP1, CDH1, CDKN2A  (p14ARF), CDKN2A (p16INK4a), CKD4, CHEK2, CTNNA1, DICER1, EPCAM (Deletion/duplication testing only), GREM1 (promoter region deletion/duplication testing only), KIT, MEN1, MLH1, MSH2, MSH3, MSH6, MUTYH, NBN, NF1, NHTL1, PALB2, PDGFRA, PMS2, POLD1, POLE, PTEN, RAD50, RAD51C, RAD51D, RNF43, SDHB, SDHC, SDHD, SMAD4, SMARCA4. STK11, TP53, TSC1, TSC2, and VHL.  The following genes were evaluated for sequence changes only: SDHA and HOXB13 c.251G>A variant only.   05/12/2019 - 10/27/2019 Neo-Adjuvant Chemotherapy   Keytruda/Adriamycin/Cytoxan q3 wks x 4 cycles 05/12/2019-07/14/2019 Keytruda d1/Carbo d1/Taxol d1,d8,d15 q21 days x 4 cycles 08/04/2019-10/27/19   01/10/2020 Surgery   Bilateral mastectomies with reconstruction (Cornett & Pace): Right breast: no evidence tof malignancy Left breast: no evidence of residual carcinoma and 4 benign lymph nodes.    01/10/2020 Cancer Staging   Staging form: Breast, AJCC 8th Edition - Pathologic stage from 01/10/2020: No Stage Recommended (ypT0, pN0, cM0) - Signed by Loa Socks, NP on 11/20/2020 Stage prefix: Post-therapy   02/10/2020 - 07/26/2020 Adjuvant Chemotherapy   Keytruda every 3 weeks   02/27/2020 - 04/18/2020 Radiation Therapy   Adjuvant radiation ([facility]) Left breast / 3D. A total of 50.4 Gy was given in 1.8 Gy per fraction for 28 fractions. 10X, 15X Left breast SCLV / 3D. A total of 50.4 Gy was given in 1.8 Gy per fraction for 28 fractions. 6X, 10X Left breast boost / 3D. A total of 10 Gy was given in 2 Gy per fraction for 5 fractions. 6E      CHIEF COMPLIANT: Follow-up to discuss results of the CT scan showing lung nodule  HISTORY OF PRESENT ILLNESS:  History of Present Illness   The patient, a breast cancer survivor with hypothyroidism, presents for a routine  follow-up. She reports overall good health with no new complaints. Her thyroid medication was recently adjusted to , and she has not experienced any adverse effects such as heart  palpitations since the change. She no longer takes naproxen, which was prescribed for a transient knee issue.  Regarding her breast health, she occasionally experiences discomfort due to fat necrosis, which she describes as a soreness that comes and goes. She recently had a CT scan which revealed a new nodule in the left lower lung. She is a non-smoker and has no known family history of lung disease.         ALLERGIES:  has No Known Allergies.  MEDICATIONS:  Current Outpatient Medications  Medication Sig Dispense Refill   levothyroxine (SYNTHROID) 150 MCG tablet Take 1 tablet (150 mcg total) by mouth daily before breakfast.     paragard intrauterine copper IUD IUD ParaGard T 380A 380 square mm intrauterine device  1 Paragard IUD device placed within uterine cavity; Device taken from [employer]     No current facility-administered medications for this visit.    PHYSICAL EXAMINATION: ECOG PERFORMANCE STATUS: 1 - Symptomatic but completely ambulatory  Vitals:   02/25/23 1003  BP: 132/74  Pulse: 92  Resp: 18  Temp: 97.8 F (36.6 C)  SpO2: 100%   Filed Weights   02/25/23 1003  Weight: 246 lb 4.8 oz (111.7 kg)      LABORATORY DATA:  I have reviewed the data as listed    Latest Ref Rng & Units 06/03/2021    2:52 PM 11/05/2020    2:09 PM 07/26/2020   11:15 AM  CMP  Glucose 70 - 99 mg/dL 73  94  95   BUN 6 - 20 mg/dL 13  15  15    Creatinine 0.44 - 1.00 mg/dL 1.30  8.65  7.84   Sodium 135 - 145 mmol/L 138  138  141   Potassium 3.5 - 5.1 mmol/L 4.0  4.0  4.0   Chloride 98 - 111 mmol/L 104  102  103   CO2 22 - 32 mmol/L 24  29  26    Calcium 8.9 - 10.3 mg/dL 8.8  9.3  9.5   Total Protein 6.5 - 8.1 g/dL 7.0  7.8  7.5   Total Bilirubin 0.3 - 1.2 mg/dL 0.4  0.8  0.3   Alkaline Phos 38 - 126 U/L 86  83  107   AST 15 - 41 U/L 20  40  24   ALT 0 - 44 U/L 16  39  18     Lab Results  Component Value Date   WBC 6.3 06/03/2021   HGB 12.1 06/03/2021   HCT 36.7 06/03/2021    MCV 91.8 06/03/2021   PLT 294 06/03/2021   NEUTROABS 3.6 06/03/2021    ASSESSMENT & PLAN:  Malignant neoplasm of overlapping sites of left breast in female, estrogen receptor negative (HCC) 04/25/2019:Patient palpated a left breast and axillary mass x28months. US of the left breast showed a 3.6cm mass at the 1 o'clock position with 6 smaller adjacent masses extending toward the nipple ranging in size from 1.1cm to 4.0cm. US of the left axilla showed five bulky lymph nodes, the largest measuring 4.2cm, and second largest measuring 2.8cm. 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  T1c N2 stage IIIc Based on PET CT scan showing bilateral cervical nodes, it is stage IV (however her goal is to cure given  the oligometastatic nature of her cancer.)   01/10/2020: Bilateral mastectomies with reconstruction (Cornett & Pace): Right breast: no evidence of malignancy Left breast: no evidence of residual carcinoma and 4 benign lymph nodes.  ---------------------------------------------------------------------------------------------------------------------------------------------------------------------------  Treatment summary:  1.  Adjuvant radiation completed 04/18/2020 2. adjuvant Pembrolizumab maintenance therapy completed 07/26/2020   Immune mediated adverse effects: Hypothyroidism, on Synthroid, under the care of endocrinology.   Breast cancer surveillance: 1.  No role of imaging studies since she had bilateral mastectomies 2. breast exam 02/25/2023: Benign   CT CAP 08/10/2020: Subtle areas of hyperenhancement in the liver parenchyma probably benign (fatty liver) cannot rule out other causes and this suggested an MRI of the abdomen   Nodularity in the left breast: Biopsy July 2023: Fat necrosis.  Abdominal pain and back pain: CT CAP 01/20/2022: No evidence of metastatic disease, fatty liver   CT CAP 02/05/2023: 8 mm new lung nodule Lung nodule: Recommend CT  scan in 3 months and telephone visit follow-up.  We will also set her up for guardant reveal testing to look for minimal residual disease. ------------------------------------- Assessment and Plan    Hypothyroidism Stable on Levothyroxine daily without any symptoms of hyperthyroidism such as heart palpitations. -Continue Levothyroxine daily.  Breast Fat Necrosis Intermittent discomfort and soreness, no constant pain. -Continue monitoring symptoms.  Lung Nodule New nodule identified in the left lower lung on recent CT scan. No history of smoking. Discussed the need for follow-up imaging to monitor for changes in size. -Schedule follow-up CT chest in 3 months (May 05, 2023) to monitor nodule.  Breast Cancer Surveillance Discussed the use of a novel blood test (Gardant Reveal) for surveillance of breast cancer. The patient agreed to this approach. -Set up Gardant Reveal blood test, to be drawn every 6 months at home.  Follow-up Plan for a telephone call one week after the CT scan to discuss results.          Orders Placed This Encounter  Procedures   CT Chest W Contrast    Standing Status:   Future    Standing Expiration Date:   02/25/2024    Order Specific Question:   If indicated for the ordered procedure, I authorize the administration of contrast media per Radiology protocol    Answer:   Yes    Order Specific Question:   Does the patient have a contrast media/X-ray dye allergy?    Answer:   No    Order Specific Question:   Is patient pregnant?    Answer:   No    Order Specific Question:   Preferred imaging location?    Answer:   [facility]    Order Specific Question:   Release to patient    Answer:   Immediate [1]   The patient has a good understanding of the overall plan. she agrees with it. she will call with any problems that may develop before the next visit here. Total time spent: 30 mins including face to face time and time spent for  planning, charting and co-ordination of care   Tamsen Meek, MD 02/25/23

## 2023-02-25 NOTE — Telephone Encounter (Signed)
Per md orders entered for Guardant Reveal. All supported documents faxed to [phone]. Faxed confirmation was received.

## 2023-02-25 NOTE — Telephone Encounter (Signed)
Per md orders entered for [facility] and all supported documents faxed to [phone]. Faxed confirmation was received.

## 2023-02-25 NOTE — Assessment & Plan Note (Addendum)
04/25/2019:Patient palpated a left breast and axillary mass x69months. US of the left breast showed a 3.6cm mass at the 1 o'clock position with 6 smaller adjacent masses extending toward the nipple ranging in size from 1.1cm to 4.0cm. US of the left axilla showed five bulky lymph nodes, the largest measuring 4.2cm, and second largest measuring 2.8cm. 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  T1c N2 stage IIIc Based on PET CT scan showing bilateral cervical nodes, it is stage IV (however her goal is to cure given the oligometastatic nature of her cancer.)   01/10/2020: Bilateral mastectomies with reconstruction ([facility]): Right breast: no evidence of malignancy Left breast: no evidence of residual carcinoma and 4 benign lymph nodes.  ---------------------------------------------------------------------------------------------------------------------------------------------------------------------------  Treatment summary:  1.  Adjuvant radiation completed 04/18/2020 2. adjuvant Pembrolizumab maintenance therapy completed 07/26/2020   Immune mediated adverse effects: Hypothyroidism, on Synthroid, under the care of endocrinology.   Breast cancer surveillance: 1.  No role of imaging studies since she had bilateral mastectomies 2. breast exam 02/25/2023: Benign   CT CAP 08/10/2020: Subtle areas of hyperenhancement in the liver parenchyma probably benign (fatty liver) cannot rule out other causes and this suggested an MRI of the abdomen   Nodularity in the left breast: Biopsy July 2023: Fat necrosis.  Abdominal pain and back pain: CT CAP 01/20/2022: No evidence of metastatic disease, fatty liver   CT CAP 02/05/2023: 8 mm new lung nodule Lung nodule: Recommend CT scan in 3 months and telephone visit follow-up.  We will also set her up for guardant reveal testing to look for minimal residual disease.

## 2023-03-30 ENCOUNTER — Encounter: Payer: Self-pay | Admitting: *Deleted

## 2023-03-30 NOTE — Progress Notes (Signed)
Received message from [facility] team stating patient was non responsive to mobile phlebotomy team.  Testing will be canceled at this time.  If pt wishes to proceed in the future, orders will be placed.

## 2023-05-06 ENCOUNTER — Ambulatory Visit (HOSPITAL_COMMUNITY)
Admission: RE | Admit: 2023-05-06 | Discharge: 2023-05-06 | Disposition: A | Discharge status: Home / Self Care | Payer: Medicaid Other | Source: Ambulatory Visit | Attending: Hematology and Oncology | Admitting: Hematology and Oncology

## 2023-05-06 DIAGNOSIS — R911 Solitary pulmonary nodule: Secondary | ICD-10-CM | POA: Insufficient documentation

## 2023-05-06 MED ORDER — IOHEXOL 300 MG/ML  SOLN
75.0000 mL | Freq: Once | INTRAMUSCULAR | Status: AC | PRN
  Administered 2023-05-06: 75 mL via INTRAVENOUS

## 2023-05-11 ENCOUNTER — Inpatient Hospital Stay: Payer: Medicaid Other | Attending: Hematology and Oncology | Admitting: Hematology and Oncology

## 2023-05-11 DIAGNOSIS — Z171 Estrogen receptor negative status [ER-]: Secondary | ICD-10-CM

## 2023-05-11 DIAGNOSIS — C50812 Malignant neoplasm of overlapping sites of left female breast: Secondary | ICD-10-CM | POA: Diagnosis not present

## 2023-05-11 NOTE — Assessment & Plan Note (Signed)
04/25/2019:Patient palpated a left breast and axillary mass x71months. US of the left breast showed a 3.6cm mass at the 1 o'clock position with 6 smaller adjacent masses extending toward the nipple ranging in size from 1.1cm to 4.0cm. US of the left axilla showed five bulky lymph nodes, the largest measuring 4.2cm, and second largest measuring 2.8cm. 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  T1c N2 stage IIIc Based on PET CT scan showing bilateral cervical nodes, it is stage IV (however her goal is to cure given the oligometastatic nature of her cancer.)   01/10/2020: Bilateral mastectomies with reconstruction ([facility] & Pace): Right breast: no evidence of malignancy Left breast: no evidence of residual carcinoma and 4 benign lymph nodes.  ---------------------------------------------------------------------------------------------------------------------------------------------------------------------------  Treatment summary:  1.  Adjuvant radiation completed 04/18/2020 2. adjuvant Pembrolizumab maintenance therapy completed 07/26/2020   Immune mediated adverse effects: Hypothyroidism, on Synthroid, under the care of endocrinology.   Breast cancer surveillance: 1.  No role of imaging studies since she had bilateral mastectomies 2. breast exam 02/25/2023: Benign   CT CAP 08/10/2020: Subtle areas of hyperenhancement in the liver parenchyma probably benign (fatty liver) cannot rule out other causes and this suggested an MRI of the abdomen   Nodularity in the left breast: Biopsy July 2023: Fat necrosis.  Abdominal pain and back pain: CT CAP 01/20/2022: No evidence of metastatic disease, fatty liver   CT CAP 02/05/2023: 8 mm new lung nodule Lung nodule: Recommend CT scan in 3 months and telephone visit follow-up. CT chest 05/06/2023: Results are not available

## 2023-05-11 NOTE — Progress Notes (Signed)
HEMATOLOGY-ONCOLOGY TELEPHONE VISIT PROGRESS NOTE  I connected with our patient on 05/11/23 at  2:45 PM EST by telephone and verified that I am speaking with the correct person using two identifiers.  I discussed the limitations, risks, security and privacy concerns of performing an evaluation and management service by telephone and the availability of in person appointments.  I also discussed with the patient that there may be a patient responsible charge related to this service. The patient expressed understanding and agreed to proceed.   History of Present Illness: Follow-up to discuss the results of recently performed CT chest.   Oncology History  Malignant neoplasm of overlapping sites of left breast in female, estrogen receptor negative (HCC)  04/25/2019 Initial Diagnosis   Patient palpated a left breast and axillary mass x59months. US of the left breast showed a 3.6cm mass at the 1 o'clock position with 6 smaller adjacent masses extending toward the nipple ranging in size from 1.1cm to 4.0cm. US of the left axilla showed five bulky lymph nodes, the largest measuring 4.2cm, and second largest measuring 2.8cm. 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    04/27/2019 Cancer Staging   Staging form: Breast, AJCC 8th Edition - Clinical: Stage IIIC (cT1c, cN2a, cM0, G3, ER-, PR-, HER2-) - Signed by Serena Croissant, MD on 04/27/2019    Genetic Testing   Negative genetic testing. No pathogenic variants identified. VUS in HOXB13 called c.473C>A, VUS in POLD1 called c.1610G>C, and VUS in RAD50 called c.1094G>A identified on the [facility] Common Hereditary Cancers Panel. The report date is 05/10/2019.  The Common Hereditary Cancers Panel offered by [facility] includes sequencing and/or deletion duplication testing of the following 48 genes: APC, ATM, AXIN2, BARD1, BMPR1A, BRCA1, BRCA2, BRIP1, CDH1, CDKN2A (p14ARF), CDKN2A (p16INK4a), CKD4, CHEK2, CTNNA1, DICER1, EPCAM  (Deletion/duplication testing only), GREM1 (promoter region deletion/duplication testing only), KIT, MEN1, MLH1, MSH2, MSH3, MSH6, MUTYH, NBN, NF1, NHTL1, PALB2, PDGFRA, PMS2, POLD1, POLE, PTEN, RAD50, RAD51C, RAD51D, RNF43, SDHB, SDHC, SDHD, SMAD4, SMARCA4. STK11, TP53, TSC1, TSC2, and VHL.  The following genes were evaluated for sequence changes only: SDHA and HOXB13 c.251G>A variant only.   05/12/2019 - 10/27/2019 Neo-Adjuvant Chemotherapy   Keytruda/Adriamycin/Cytoxan q3 wks x 4 cycles 05/12/2019-07/14/2019 Keytruda d1/Carbo d1/Taxol d1,d8,d15 q21 days x 4 cycles 08/04/2019-10/27/19   01/10/2020 Surgery   Bilateral mastectomies with reconstruction ([facility]): Right breast: no evidence tof malignancy Left breast: no evidence of residual carcinoma and 4 benign lymph nodes.    01/10/2020 Cancer Staging   Staging form: Breast, AJCC 8th Edition - Pathologic stage from 01/10/2020: No Stage Recommended (ypT0, pN0, cM0) - Signed by Loa Socks, NP on 11/20/2020 Stage prefix: Post-therapy   02/10/2020 - 07/26/2020 Adjuvant Chemotherapy   Keytruda every 3 weeks   02/27/2020 - 04/18/2020 Radiation Therapy   Adjuvant radiation ([facility]) Left breast / 3D. A total of 50.4 Gy was given in 1.8 Gy per fraction for 28 fractions. 10X, 15X Left breast SCLV / 3D. A total of 50.4 Gy was given in 1.8 Gy per fraction for 28 fractions. 6X, 10X Left breast boost / 3D. A total of 10 Gy was given in 2 Gy per fraction for 5 fractions. 6E      REVIEW OF SYSTEMS:   Constitutional: Denies fevers, chills or abnormal weight loss All other systems were reviewed with the patient and are negative. Observations/Objective:     Assessment Plan:  Malignant neoplasm of overlapping sites of left  breast in female, estrogen receptor negative (HCC) 04/25/2019:Patient palpated a left breast and axillary mass x42months. US of the left breast showed a 3.6cm mass at the 1 o'clock position with 6 smaller adjacent masses  extending toward the nipple ranging in size from 1.1cm to 4.0cm. US of the left axilla showed five bulky lymph nodes, the largest measuring 4.2cm, and second largest measuring 2.8cm. 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  T1c N2 stage IIIc Based on PET CT scan showing bilateral cervical nodes, it is stage IV (however her goal is to cure given the oligometastatic nature of her cancer.)   01/10/2020: Bilateral mastectomies with reconstruction ([facility]): Right breast: no evidence of malignancy Left breast: no evidence of residual carcinoma and 4 benign lymph nodes.  ---------------------------------------------------------------------------------------------------------------------------------------------------------------------------  Treatment summary:  1.  Adjuvant radiation completed 04/18/2020 2. adjuvant Pembrolizumab maintenance therapy completed 07/26/2020   Immune mediated adverse effects: Hypothyroidism, on Synthroid, under the care of endocrinology.   Breast cancer surveillance: 1.  No role of imaging studies since she had bilateral mastectomies 2. breast exam 02/25/2023: Benign   CT CAP 08/10/2020: Subtle areas of hyperenhancement in the liver parenchyma probably benign (fatty liver) cannot rule out other causes and this suggested an MRI of the abdomen   Nodularity in the left breast: Biopsy July 2023: Fat necrosis.  Abdominal pain and back pain: CT CAP 01/20/2022: No evidence of metastatic disease, fatty liver   CT CAP 02/05/2023: 8 mm new lung nodule Lung nodule: Recommend CT scan in 3 months and telephone visit follow-up. CT chest 05/06/2023: To my review there does not appear to be any further nodule at the site of the 8 mm nodule previously.  We will wait for the final radiology read.   I discussed the assessment and treatment plan with the patient. The patient was provided an opportunity to ask questions and all were answered.  The patient agreed with the plan and demonstrated an understanding of the instructions. The patient was advised to call back or seek an in-person evaluation if the symptoms worsen or if the condition fails to improve as anticipated.   I provided 20 minutes of non-face-to-face time during this encounter.  This includes time for charting and coordination of care   Tamsen Meek, MD

## 2023-06-22 ENCOUNTER — Ambulatory Visit: Payer: Medicaid Other | Attending: Hematology and Oncology

## 2023-06-22 VITALS — Wt 249.0 lb

## 2023-06-22 DIAGNOSIS — Z483 Aftercare following surgery for neoplasm: Secondary | ICD-10-CM | POA: Insufficient documentation
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
## 2023-12-21 ENCOUNTER — Ambulatory Visit: Attending: Hematology and Oncology

## 2023-12-21 VITALS — Wt 244.0 lb

## 2023-12-21 DIAGNOSIS — Z483 Aftercare following surgery for neoplasm: Secondary | ICD-10-CM | POA: Insufficient documentation
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
## 2024-12-26 ENCOUNTER — Ambulatory Visit
